# Patient Record
Sex: Female | Born: 1960 | Race: White | Hispanic: No | Marital: Married | Smoking: Former smoker
Health system: Southern US, Community
[De-identification: ages and names within clinical notes are randomized; demographics above are authoritative.]

## PROBLEM LIST (undated history)

## (undated) DIAGNOSIS — J40 Bronchitis, not specified as acute or chronic: Secondary | ICD-10-CM

## (undated) DIAGNOSIS — I10 Essential (primary) hypertension: Secondary | ICD-10-CM

## (undated) DIAGNOSIS — R600 Localized edema: Secondary | ICD-10-CM

## (undated) DIAGNOSIS — I509 Heart failure, unspecified: Secondary | ICD-10-CM

## (undated) DIAGNOSIS — IMO0001 Reserved for inherently not codable concepts without codable children: Secondary | ICD-10-CM

## (undated) DIAGNOSIS — G4730 Sleep apnea, unspecified: Secondary | ICD-10-CM

## (undated) DIAGNOSIS — F419 Anxiety disorder, unspecified: Secondary | ICD-10-CM

## (undated) DIAGNOSIS — R32 Unspecified urinary incontinence: Secondary | ICD-10-CM

## (undated) DIAGNOSIS — K219 Gastro-esophageal reflux disease without esophagitis: Secondary | ICD-10-CM

## (undated) DIAGNOSIS — E039 Hypothyroidism, unspecified: Secondary | ICD-10-CM

## (undated) DIAGNOSIS — F329 Major depressive disorder, single episode, unspecified: Secondary | ICD-10-CM

## (undated) DIAGNOSIS — J189 Pneumonia, unspecified organism: Secondary | ICD-10-CM

## (undated) DIAGNOSIS — Z87440 Personal history of urinary (tract) infections: Secondary | ICD-10-CM

## (undated) DIAGNOSIS — J449 Chronic obstructive pulmonary disease, unspecified: Secondary | ICD-10-CM

## (undated) DIAGNOSIS — F32A Depression, unspecified: Secondary | ICD-10-CM

## (undated) DIAGNOSIS — R200 Anesthesia of skin: Secondary | ICD-10-CM

## (undated) DIAGNOSIS — I2510 Atherosclerotic heart disease of native coronary artery without angina pectoris: Secondary | ICD-10-CM

## (undated) DIAGNOSIS — R1310 Dysphagia, unspecified: Secondary | ICD-10-CM

## (undated) HISTORY — PX: OTHER SURGICAL HISTORY: SHX169

## (undated) HISTORY — PX: TRACHEOSTOMY CLOSURE: SHX458

## (undated) HISTORY — PX: ACHILLES TENDON SURGERY: SHX542

## (undated) HISTORY — PX: TONSILECTOMY, ADENOIDECTOMY, BILATERAL MYRINGOTOMY AND TUBES: SHX2538

## (undated) HISTORY — DX: Chronic obstructive pulmonary disease, unspecified: J44.9

## (undated) HISTORY — DX: Hypothyroidism, unspecified: E03.9

## (undated) HISTORY — PX: BACK SURGERY: SHX140

## (undated) HISTORY — PX: TONSILLECTOMY: SUR1361

## (undated) HISTORY — DX: Sleep apnea, unspecified: G47.30

## (undated) HISTORY — PX: TRACHEOSTOMY: SUR1362

## (undated) HISTORY — PX: GASTROSTOMY W/ FEEDING TUBE: SUR642

## (undated) HISTORY — DX: Heart failure, unspecified: I50.9

---

## 2002-11-23 ENCOUNTER — Ambulatory Visit (HOSPITAL_COMMUNITY): Admission: RE | Admit: 2002-11-23 | Discharge: 2002-11-23 | Payer: Self-pay | Admitting: Family Medicine

## 2003-01-21 ENCOUNTER — Ambulatory Visit (HOSPITAL_COMMUNITY): Admission: RE | Admit: 2003-01-21 | Discharge: 2003-01-21 | Payer: Self-pay | Admitting: Internal Medicine

## 2003-01-31 ENCOUNTER — Encounter: Admission: RE | Admit: 2003-01-31 | Discharge: 2003-01-31 | Payer: Self-pay | Admitting: Orthopedic Surgery

## 2003-02-14 ENCOUNTER — Encounter: Admission: RE | Admit: 2003-02-14 | Discharge: 2003-02-14 | Payer: Self-pay | Admitting: Orthopedic Surgery

## 2003-02-28 ENCOUNTER — Encounter: Admission: RE | Admit: 2003-02-28 | Discharge: 2003-02-28 | Payer: Self-pay | Admitting: Orthopedic Surgery

## 2003-03-17 ENCOUNTER — Ambulatory Visit (HOSPITAL_COMMUNITY): Admission: RE | Admit: 2003-03-17 | Discharge: 2003-03-20 | Payer: Self-pay | Admitting: Neurological Surgery

## 2003-03-18 ENCOUNTER — Encounter (INDEPENDENT_AMBULATORY_CARE_PROVIDER_SITE_OTHER): Payer: Self-pay | Admitting: Specialist

## 2003-09-29 ENCOUNTER — Inpatient Hospital Stay (HOSPITAL_COMMUNITY): Admission: RE | Admit: 2003-09-29 | Discharge: 2003-09-30 | Payer: Self-pay | Admitting: Neurological Surgery

## 2003-11-22 ENCOUNTER — Ambulatory Visit (HOSPITAL_COMMUNITY): Admission: RE | Admit: 2003-11-22 | Discharge: 2003-11-22 | Payer: Self-pay | Admitting: Orthopedic Surgery

## 2004-04-04 ENCOUNTER — Ambulatory Visit (HOSPITAL_COMMUNITY): Admission: RE | Admit: 2004-04-04 | Discharge: 2004-04-04 | Payer: Self-pay | Admitting: Family Medicine

## 2004-06-14 ENCOUNTER — Ambulatory Visit (HOSPITAL_COMMUNITY): Admission: RE | Admit: 2004-06-14 | Discharge: 2004-06-14 | Payer: Self-pay | Admitting: Neurological Surgery

## 2004-07-31 ENCOUNTER — Inpatient Hospital Stay (HOSPITAL_COMMUNITY): Admission: RE | Admit: 2004-07-31 | Discharge: 2004-08-04 | Payer: Self-pay | Admitting: Neurological Surgery

## 2006-02-13 ENCOUNTER — Ambulatory Visit (HOSPITAL_COMMUNITY): Admission: RE | Admit: 2006-02-13 | Discharge: 2006-02-13 | Payer: Self-pay | Admitting: Family Medicine

## 2006-07-01 ENCOUNTER — Ambulatory Visit (HOSPITAL_COMMUNITY): Admission: RE | Admit: 2006-07-01 | Discharge: 2006-07-01 | Payer: Self-pay | Admitting: Family Medicine

## 2006-10-07 ENCOUNTER — Inpatient Hospital Stay (HOSPITAL_COMMUNITY): Admission: RE | Admit: 2006-10-07 | Discharge: 2006-10-08 | Payer: Self-pay | Admitting: Neurological Surgery

## 2008-02-29 ENCOUNTER — Ambulatory Visit (HOSPITAL_COMMUNITY): Admission: RE | Admit: 2008-02-29 | Discharge: 2008-02-29 | Payer: Self-pay | Admitting: Family Medicine

## 2008-03-23 ENCOUNTER — Ambulatory Visit: Payer: Self-pay | Admitting: Orthopedic Surgery

## 2008-03-23 DIAGNOSIS — M7660 Achilles tendinitis, unspecified leg: Secondary | ICD-10-CM | POA: Insufficient documentation

## 2008-03-30 ENCOUNTER — Ambulatory Visit: Payer: Self-pay | Admitting: Orthopedic Surgery

## 2008-03-30 ENCOUNTER — Encounter: Admission: RE | Admit: 2008-03-30 | Discharge: 2008-05-10 | Payer: Self-pay | Admitting: Orthopedic Surgery

## 2008-04-01 ENCOUNTER — Telehealth: Payer: Self-pay | Admitting: Orthopedic Surgery

## 2008-04-01 ENCOUNTER — Encounter: Payer: Self-pay | Admitting: Orthopedic Surgery

## 2008-04-19 ENCOUNTER — Encounter: Payer: Self-pay | Admitting: Orthopedic Surgery

## 2008-05-18 ENCOUNTER — Ambulatory Visit: Payer: Self-pay | Admitting: Orthopedic Surgery

## 2008-05-20 ENCOUNTER — Telehealth: Payer: Self-pay | Admitting: Orthopedic Surgery

## 2008-05-24 ENCOUNTER — Ambulatory Visit (HOSPITAL_COMMUNITY): Admission: RE | Admit: 2008-05-24 | Discharge: 2008-05-24 | Payer: Self-pay | Admitting: Orthopedic Surgery

## 2008-05-24 ENCOUNTER — Ambulatory Visit: Payer: Self-pay | Admitting: Orthopedic Surgery

## 2008-05-26 ENCOUNTER — Ambulatory Visit: Payer: Self-pay | Admitting: Orthopedic Surgery

## 2008-05-26 ENCOUNTER — Encounter: Payer: Self-pay | Admitting: Orthopedic Surgery

## 2008-06-02 ENCOUNTER — Ambulatory Visit: Payer: Self-pay | Admitting: Orthopedic Surgery

## 2008-06-06 ENCOUNTER — Ambulatory Visit: Payer: Self-pay | Admitting: Orthopedic Surgery

## 2008-06-06 ENCOUNTER — Encounter (INDEPENDENT_AMBULATORY_CARE_PROVIDER_SITE_OTHER): Payer: Self-pay | Admitting: *Deleted

## 2008-06-07 ENCOUNTER — Encounter: Payer: Self-pay | Admitting: Orthopedic Surgery

## 2008-06-07 ENCOUNTER — Encounter (HOSPITAL_COMMUNITY): Admission: RE | Admit: 2008-06-07 | Discharge: 2008-07-07 | Payer: Self-pay | Admitting: Orthopedic Surgery

## 2008-06-13 ENCOUNTER — Encounter: Payer: Self-pay | Admitting: Orthopedic Surgery

## 2008-06-15 ENCOUNTER — Encounter: Payer: Self-pay | Admitting: Orthopedic Surgery

## 2008-06-17 ENCOUNTER — Telehealth: Payer: Self-pay | Admitting: Orthopedic Surgery

## 2008-06-28 ENCOUNTER — Telehealth: Payer: Self-pay | Admitting: Orthopedic Surgery

## 2008-07-04 ENCOUNTER — Ambulatory Visit: Payer: Self-pay | Admitting: Orthopedic Surgery

## 2008-07-04 ENCOUNTER — Encounter (INDEPENDENT_AMBULATORY_CARE_PROVIDER_SITE_OTHER): Payer: Self-pay | Admitting: *Deleted

## 2008-07-11 ENCOUNTER — Encounter (HOSPITAL_COMMUNITY): Admission: RE | Admit: 2008-07-11 | Discharge: 2008-08-10 | Payer: Self-pay | Admitting: Orthopedic Surgery

## 2008-07-15 ENCOUNTER — Encounter: Payer: Self-pay | Admitting: Orthopedic Surgery

## 2008-07-27 ENCOUNTER — Encounter (INDEPENDENT_AMBULATORY_CARE_PROVIDER_SITE_OTHER): Payer: Self-pay | Admitting: *Deleted

## 2008-07-27 ENCOUNTER — Ambulatory Visit: Payer: Self-pay | Admitting: Orthopedic Surgery

## 2008-07-28 ENCOUNTER — Encounter: Payer: Self-pay | Admitting: Orthopedic Surgery

## 2008-08-03 ENCOUNTER — Telehealth: Payer: Self-pay | Admitting: Orthopedic Surgery

## 2008-08-08 ENCOUNTER — Telehealth: Payer: Self-pay | Admitting: Orthopedic Surgery

## 2008-08-17 ENCOUNTER — Ambulatory Visit: Payer: Self-pay | Admitting: Orthopedic Surgery

## 2008-08-17 ENCOUNTER — Encounter (INDEPENDENT_AMBULATORY_CARE_PROVIDER_SITE_OTHER): Payer: Self-pay | Admitting: *Deleted

## 2008-09-20 ENCOUNTER — Ambulatory Visit: Payer: Self-pay | Admitting: Orthopedic Surgery

## 2008-09-23 ENCOUNTER — Encounter: Payer: Self-pay | Admitting: Orthopedic Surgery

## 2010-05-02 LAB — BASIC METABOLIC PANEL
CO2: 25 mEq/L (ref 19–32)
Calcium: 9.7 mg/dL (ref 8.4–10.5)
Creatinine, Ser: 0.64 mg/dL (ref 0.4–1.2)
Glucose, Bld: 94 mg/dL (ref 70–99)

## 2010-06-05 NOTE — Op Note (Signed)
Meghan Hoover, Meghan Hoover                ACCOUNT NO.:  [id number]   MEDICAL RECORD NO.:  [id number]          PATIENT TYPE:  AMB   LOCATION:  DAY                           FACILITY:  [facility]   PHYSICIAN:  Vickki Hearing, M.D.DATE OF BIRTH:  Oct 30, 1960   DATE OF PROCEDURE:  05/24/2008  DATE OF DISCHARGE:                               OPERATIVE REPORT   HISTORY:  This is a 50 year old female referred to me by Dr. Nobie Putnam  for palm bump and pain in the posterior aspect of her left ankle since  August 2009.  She had a injury to her right ankle, but turned and placed  all of her weight on her left foot and felt immediate pain in the back  of the ankle and then had difficulty walking.  She was treated  conservatively, eventually took some Tylenol, Aleve, ibuprofen, worn  ankle brace, tried ice, and pain patches did not improve.  I saw her in  physical therapy.  She wore open back shoes, still did not improve.  She  was having difficulty ambulating and could not really climb the stairs  well.  We offered her surgery versus continued nonoperative treatment  and she opted for surgery.  Informed consent was done in the office.  The patient understand the risk of infection, wound problems, and tendon  rupture as well as ankle stiffness.   She agreed to perform physical therapy program as prescribed.   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   PROCEDURE:  Debridement of Achilles tendon resection of Haglund  deformity.   SURGEON:  Vickki Hearing, MD   ASSISTANTS:  Irena Reichmann.   ANESTHESIA:  General.   FINDINGS:  Tendonitis mild and Achilles tendon, large pump bump Haglund  deformity.   SPECIMENS:  None.  Case was clean.   No complications were noted.  Counts were reported as correct.   PROCEDURE DONE AS FOLLOWS:  Site  marking, the patient identification,  and chart update performed in the preop area.  The patient brought to  surgery given preoperative antibiotics with gram of Ancef.  Tolerated  that without any problems.  She was given general anesthesia and placed  in lateral decubitus position with an axillary roll left side up.   After sterile prep and drape with DuraPrep, time-out procedure was  completed.   Straight incision was made anterior to the Achilles tendon, but  posterior to lateral malleolus and taken down through the subcutaneous  tissue with blunt dissection.  Subperiosteal dissection was performed in  the calcaneus and the Achilles tendon was elevated from the superior  margin of the calcaneus.  Debridement was performed less than 25% of the  tendon required debridement of bursa was removed.  Blunt retractors were  placed on the medial side of the calcaneus and oscillating saw used to  removed the Haglund deformity.  This area was contoured with a rasp.  The superior portion was covered with bone  wax and in a suture anchor  size 5.0 corkscrew was placed in the calcaneus and 2 sutures were used  to reapproximate the Achilles to the calcaneus.  The wound was then  irrigated and closed in layered fashion with 0 Monocryl, 2-0 Monocryl,  and 3-0 nylon.  We injected 20 mL of 0.5% Marcaine and then applied a  Cam walker with the foot in neutral position.   POSTOPERATIVE PLAN:  The patient to be nonweightbearing on the left  ankle.  She is discharged with Hydrocodone 10/325 mg one q.4 p.r.n. for  pain.  She is to ice and elevate the foot.  Follow up with me on  Thursday for dressing change.      Vickki Hearing, M.D.  Electronically Signed     SEH/MEDQ  D:  05/24/2008  T:  05/25/2008  Job:  [id number]   cc:   Patrica Duel, M.D.  Fax: [phone]

## 2010-06-05 NOTE — Op Note (Signed)
[name], [name]                ACCOUNT NO.:  [id number]   MEDICAL RECORD NO.:  [id number]          PATIENT TYPE:  INP   LOCATION:  3034                         FACILITY:  [facility]   PHYSICIAN:  Stefani Dama, M.D.  DATE OF BIRTH:  1960-04-29   DATE OF PROCEDURE:  10/07/2006  DATE OF DISCHARGE:                               OPERATIVE REPORT   PREOPERATIVE DIAGNOSIS:  Cervical spondylosis with radiculopathy C5-6  and C6-7.   POSTOPERATIVE DIAGNOSIS:  Cervical spondylosis with radiculopathy C5-6  and C6-7.   PROCEDURE:  Anterior cervical decompression C5-6 and C6-7, arthrodesis  with autograft from left iliac crest bone, anterior plate fixation Z6-X0  with Alphatec plate, bone harvesting from left anterior-superior iliac  crest through separate incision.   SURGEON:  Stefani Dama, M.D.   ASSISTANT:  Dr. Shirlean Kelly.   ANESTHESIA:  General endotracheal.   INDICATIONS:  Meghan Hoover is a 50 year old individual who has had  significant cervical spondylosis with a right cervical radiculopathy.  She was advised regarding surgical decompression arthrodesis.  Because  she is high risk for nonunion because of her smoking history, she opted  for use of iliac crest bone graft which she had used before for lumbar  fusion and this procedure is now being performed.   PROCEDURE:  The patient was brought to the operating room, placed on  table supine position.  After smooth induction of general endotracheal  anesthesia she was placed in 5 pounds of halter traction.  Neck was  prepped with alcohol and DuraPrep and draped in sterile fashion as was  the left anterior-superior iliac crest region.   The patient underwent an anterior approach via transverse incision left  anterior portion of the neck.  The platysma was divided and dissection  of the subcutaneous tissues was obtained until prevertebral space was  reached.  The first identifiable disk space was noted to be that of C4-  C5.  Dissection was then carried inferiorly to expose C5-6 and C6-7.  Large ventral osteophytes were incurred and these were rongeured down  with combination of rongeur and high-speed bur.  Disk space was entered  at the C5-6 first and then a combination of curettes and rongeurs was  used to evacuate a significant quantity of severely desiccated disk  material.  The region of posterior longitudinal ligament was identified  and then the dissection was carried out to the right side where there  was significant osteophytosis from the inferior margin body of C5.  This  was drilled down with high-speed bur.  The uncinate process,  particularly on the right side was hypertrophied and this was similarly  drilled down to 2.3 mm dissecting tool.  In the end the posterior  longitudinal ligament was opened from side-to-side and both lateral  recesses were well decompressed.  While there a localizing radiographs  were being obtained, the procedure was turned to the left anterior  superior iliac spine region.   At the left anterior-superior iliac spine, a longitudinal incision was  created over the anterior iliac spine.  The patient was noted be  morbidly  obese with large panniculus in this region.  Skin was stretched  superiorly so as to allow minimal dissection down to the anterior-  superior iliac spine skin.  The skin in this region was infiltrated with  lidocaine plus epinephrine with 0.25% Marcaine.  The soft tissues and  the fascia in this area was also infiltrated with similar solution for  total of about 15 mL.  The fascia over the anterior-superior iliac spine  was then stripped using a monopolar cautery.  Subperiosteal dissection  was then performed around this area and a portion of the iliac crest was  isolated.  An 8 mm Caspar saw was then used to harvest two continuous  bone grafts from the anterior-superior iliac spine.  The bone grafts  were removed using a small osteotome and when  laid aside, bleeding from  the bone graft donor site was controlled with pledgets of Gelfoam soaked  in thrombin that were pushed into the interstices of bone.  This  controlled the area well.  Surrounding fascia was checked for hemostasis  with cautery and this area was packed off.  Later it was closed with 2-0  Vicryl in the fascia over the anterior-superior iliac spine, 2-0 Vicryl  in subcutaneous tissues and 3-0 Vicryl in the subcuticular tissues  covered by Dermabond.   The main portion of procedure was then continued by taking one of the  smaller grafts and fashioning this to fit into the interspace with the  cortical surface facing ventrally.  The surfaces of the bone graft of  the vertebral endplates were shaped with a 4 mm oval shape bur.  The  graft was tamped and countersunk appropriately.  Attention was then  turned to C6-7 where similar decompression was performed with similar  findings of significant osteophytosis particularly in the uncinate  process on the right side.  With this being decompressed, the second  graft was fashioned the appropriate size and shape and tamped into the  interspace in a similar fashion with the cortical surface facing  ventrally.  Traction was then removed and 37 mm standard size Alphatec  type plate of the Trestle design was used using 14 mm variable angle  screws to secure this at C5, C6 and C7.  Final localizing radiograph  identified the superior screws and the bone graft C5-C6, but C6-C7 could  not be visualized.  Hemostasis was obtained meticulously in the soft  tissues and then the platysma was closed with 3-0 Vicryl interrupted  fashion, 3-0 Vicryl was used in subcuticular tissues and Dermabond was  placed on the skin for both incisions.  The patient tolerated procedure  well, was returned to recovery in stable condition.      Stefani Dama, M.D.  Electronically Signed     HJE/MEDQ  D:  10/07/2006  T:  10/08/2006  Job:   [id number]

## 2010-06-08 NOTE — Discharge Summary (Signed)
Meghan Hoover, Meghan Hoover                ACCOUNT NO.:  [id number]   MEDICAL RECORD NO.:  [id number]          PATIENT TYPE:  INP   LOCATION:  3006                         FACILITY:  [facility]   PHYSICIAN:  Stefani Dama, M.D.  DATE OF BIRTH:  13-Dec-1960   DATE OF ADMISSION:  07/31/2004  DATE OF DISCHARGE:  08/04/2004                                 DISCHARGE SUMMARY   ADMISSION DIAGNOSES:  1.  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  2.  Morbid obesity.  3.  Tobacco abuse.   DISCHARGE DIAGNOSIS/FINAL DIAGNOSES:  1.  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  2.  Morbid obesity.  3.  Tobacco abuse.   CONDITION ON DISCHARGE:  Improving.   HOSPITAL COURSE:  Ms. [name] is a 50 year old individual who has had  significant back and left lower extremity pain. 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 Each  time she got good and prompt relief, however the pain gradually recurred.  Most recent evaluation showed that she had recurrence of the synovial cyst  with significant spondylitic degeneration of the facet joint and the patient  was advised that she should undergo cervical decompression and stabilization  of the joint with posterior interbody fusion and fixation. This was  performed with the use of a spiral plate at J4-7. Postoperatively the  patient has had a moderate amount of back pain and slow to mobilize partly  due to significant size. She has had a considerable amount of muscle spasm.  This has been treated orally with pain medication in the form of Percocet  and also Valium. She has been on a PCA pump and at this time is on oral  medication and is discharged home with a prescription for Percocet 5 mg,  #60, without refills; Valium 5 mg, #40, without refills. She will be seen in  the office in three to four weeks time. Incision is clean and dry. She has  remained afebrile.       HJE/MEDQ  D:  08/04/2004  T:  08/04/2004  Job:  [id number]

## 2010-11-02 LAB — CBC
HCT: 42.2
Hemoglobin: 14.5
MCHC: 34.3
RBC: 4.84
RDW: 15.4 — ABNORMAL HIGH

## 2010-11-02 LAB — COMPREHENSIVE METABOLIC PANEL
Alkaline Phosphatase: 77
BUN: 9
CO2: 28
Calcium: 9.3
GFR calc non Af Amer: >60
Glucose, Bld: 103 — ABNORMAL HIGH
Potassium: 4.7
Total Protein: 6.6

## 2012-01-28 ENCOUNTER — Telehealth: Payer: Self-pay

## 2012-01-28 NOTE — Telephone Encounter (Signed)
   Gastroenterology Pre-Procedure Form  Pt was triaged by Amanda Cockayne, CMA  Request Date: 01/24/2012    Requesting Physician: Dr. Regino Schultze     PATIENT INFORMATION:  Meghan Hoover is a 52 y.o., female (DOB=November 18, 1960).  PROCEDURE: Procedure(s) requested: colonoscopy Procedure Reason: screening for colon cancer  PATIENT REVIEW QUESTIONS: The patient reports the following:   1. Diabetes Melitis: no 2. Joint replacements in the past 12 months: no 3. Major health problems in the past 3 months: no 4. Has an artificial valve or MVP:no 5. Has been advised in past to take antibiotics in advance of a procedure like teeth cleaning: no}    MEDICATIONS & ALLERGIES:    Patient reports the following regarding taking any blood thinners:   Plavix? no Aspirin?yes  ( only prn ) Coumadin?  no  Patient confirms/reports the following medications:  Current Outpatient Prescriptions  Medication Sig Dispense Refill  . aspirin 81 MG tablet Take 81 mg by mouth daily. Pt only takes prn      . buPROPion (WELLBUTRIN SR) 150 MG 12 hr tablet Take 150 mg by mouth 2 (two) times daily.      Marland Kitchen esomeprazole (NEXIUM) 40 MG capsule Take 40 mg by mouth daily before breakfast.      . furosemide (LASIX) 20 MG tablet Take 20 mg by mouth daily. 1/2 TABLET DAILY      . HYDROcodone-homatropine (HYCODAN) 5-1.5 MG/5ML syrup Take by mouth every 6 (six) hours as needed. As directed      . losartan (COZAAR) 100 MG tablet Take 100 mg by mouth daily.      . methocarbamol (ROBAXIN) 500 MG tablet Take 500 mg by mouth 1 day or 1 dose.      Marland Kitchen Olopatadine HCl (PATADAY) 0.2 % SOLN Apply to eye. One gtt both eyes twice daily      . predniSONE (DELTASONE) 5 MG tablet Take 5 mg by mouth daily.        Patient confirms/reports the following allergies:  Allergies  Allergen Reactions  . Augmentin (Amoxicillin-Pot Clavulanate) Other (See Comments)    NOT SURE/ IT'S BEEN AWHILE    Patient is appropriate to schedule for requested  procedure(s): yes  AUTHORIZATION INFORMATION Primary Insurance:   ID #:   Group #:  Pre-Cert / Auth required:  Pre-Cert / Auth #:   Secondary Insurance:  ID #:  Group #:  Pre-Cert / Auth required:  Pre-Cert / Auth #:   No orders of the defined types were placed in this encounter.    SCHEDULE INFORMATION: Procedure has been scheduled as follows:  Date: 02/10/2012     Time: 9:30 AM  Location: [facility] Short Stay  This Gastroenterology Pre-Precedure Form is being routed to the following provider(s) for review: Jonette Eva, MD

## 2012-01-29 ENCOUNTER — Other Ambulatory Visit: Payer: Self-pay

## 2012-01-29 ENCOUNTER — Encounter (HOSPITAL_COMMUNITY): Payer: Self-pay | Admitting: Pharmacy Technician

## 2012-01-29 DIAGNOSIS — Z139 Encounter for screening, unspecified: Secondary | ICD-10-CM

## 2012-01-29 MED ORDER — PEG-KCL-NACL-NASULF-NA ASC-C 100 G PO SOLR: 1.0000 | Freq: Once | ORAL | Status: DC

## 2012-01-29 MED ORDER — PEG-KCL-NACL-NASULF-NA ASC-C 100 G PO SOLR: 1.0000 | Freq: Once | ORAL | Status: AC

## 2012-01-29 NOTE — Telephone Encounter (Signed)
Dr. Darrick Penna, I could not send the order for Movie Prep because of allergy to PCN. Please advise!

## 2012-01-29 NOTE — Telephone Encounter (Signed)
MOVI PREP SPLIT DOSING, REGULAR BREAKFAST. CLEAR LIQUIDS AFTER 9 AM.  

## 2012-01-29 NOTE — Telephone Encounter (Signed)
REVIEWED CONTRAINDICATIONS FOR MOVIPREP. NO CIs FOR PT. RX SENT.

## 2012-01-30 NOTE — Telephone Encounter (Signed)
Instructions mailed to pt.

## 2012-02-03 ENCOUNTER — Telehealth: Payer: Self-pay

## 2012-02-03 NOTE — Telephone Encounter (Signed)
LATE ENTRY:  Pt called at noon on Friday and said she wanted to cancel the colonoscopy that was scheduled for 02/10/2012 with Dr. Darrick Penna due to insurance and she will call and reschedule in March. LMOM for Meghan Hoover this AM.

## 2012-02-10 ENCOUNTER — Ambulatory Visit (HOSPITAL_COMMUNITY)
Admission: RE | Admit: 2012-02-10 | Payer: BC Managed Care – PPO | Source: Ambulatory Visit | Admitting: Gastroenterology

## 2012-02-10 ENCOUNTER — Encounter (HOSPITAL_COMMUNITY): Admission: RE | Payer: Self-pay | Source: Ambulatory Visit

## 2012-02-10 SURGERY — COLONOSCOPY
Anesthesia: Moderate Sedation

## 2012-03-31 ENCOUNTER — Telehealth: Payer: Self-pay | Admitting: *Deleted

## 2012-04-09 NOTE — Telephone Encounter (Signed)
Orders encouter 

## 2012-11-19 ENCOUNTER — Encounter (HOSPITAL_COMMUNITY): Payer: Self-pay | Admitting: Emergency Medicine

## 2012-11-19 ENCOUNTER — Inpatient Hospital Stay (HOSPITAL_COMMUNITY)
Admit: 2012-11-19 | Discharge: 2012-11-19 | Disposition: A | Discharge status: Home / Self Care | Payer: BC Managed Care – PPO | Attending: Physical Therapy | Admitting: Physical Therapy

## 2012-11-19 ENCOUNTER — Inpatient Hospital Stay (HOSPITAL_COMMUNITY)
Admission: EM | Admit: 2012-11-19 | Discharge: 2012-12-09 | DRG: 004 | Disposition: A | Discharge status: Other Acute Inpatient Hospital | Payer: BC Managed Care – PPO | Attending: Pulmonary Disease | Admitting: Pulmonary Disease

## 2012-11-19 ENCOUNTER — Emergency Department (HOSPITAL_COMMUNITY): Payer: BC Managed Care – PPO

## 2012-11-19 DIAGNOSIS — I110 Hypertensive heart disease with heart failure: Secondary | ICD-10-CM | POA: Diagnosis present

## 2012-11-19 DIAGNOSIS — E8779 Other fluid overload: Secondary | ICD-10-CM

## 2012-11-19 DIAGNOSIS — E870 Hyperosmolality and hypernatremia: Secondary | ICD-10-CM | POA: Diagnosis not present

## 2012-11-19 DIAGNOSIS — J9600 Acute respiratory failure, unspecified whether with hypoxia or hypercapnia: Secondary | ICD-10-CM

## 2012-11-19 DIAGNOSIS — I872 Venous insufficiency (chronic) (peripheral): Secondary | ICD-10-CM | POA: Diagnosis present

## 2012-11-19 DIAGNOSIS — Z6841 Body Mass Index (BMI) 40.0 and over, adult: Secondary | ICD-10-CM

## 2012-11-19 DIAGNOSIS — I2609 Other pulmonary embolism with acute cor pulmonale: Secondary | ICD-10-CM

## 2012-11-19 DIAGNOSIS — A419 Sepsis, unspecified organism: Principal | ICD-10-CM

## 2012-11-19 DIAGNOSIS — J189 Pneumonia, unspecified organism: Secondary | ICD-10-CM

## 2012-11-19 DIAGNOSIS — J9620 Acute and chronic respiratory failure, unspecified whether with hypoxia or hypercapnia: Secondary | ICD-10-CM | POA: Diagnosis present

## 2012-11-19 DIAGNOSIS — J449 Chronic obstructive pulmonary disease, unspecified: Secondary | ICD-10-CM | POA: Diagnosis present

## 2012-11-19 DIAGNOSIS — I10 Essential (primary) hypertension: Secondary | ICD-10-CM

## 2012-11-19 DIAGNOSIS — E874 Mixed disorder of acid-base balance: Secondary | ICD-10-CM | POA: Diagnosis present

## 2012-11-19 DIAGNOSIS — F17200 Nicotine dependence, unspecified, uncomplicated: Secondary | ICD-10-CM

## 2012-11-19 DIAGNOSIS — E8770 Fluid overload, unspecified: Secondary | ICD-10-CM

## 2012-11-19 DIAGNOSIS — R609 Edema, unspecified: Secondary | ICD-10-CM

## 2012-11-19 DIAGNOSIS — I509 Heart failure, unspecified: Secondary | ICD-10-CM

## 2012-11-19 DIAGNOSIS — E119 Type 2 diabetes mellitus without complications: Secondary | ICD-10-CM | POA: Diagnosis present

## 2012-11-19 DIAGNOSIS — J9601 Acute respiratory failure with hypoxia: Secondary | ICD-10-CM

## 2012-11-19 DIAGNOSIS — Z79899 Other long term (current) drug therapy: Secondary | ICD-10-CM

## 2012-11-19 DIAGNOSIS — R601 Generalized edema: Secondary | ICD-10-CM

## 2012-11-19 DIAGNOSIS — I498 Other specified cardiac arrhythmias: Secondary | ICD-10-CM | POA: Diagnosis present

## 2012-11-19 DIAGNOSIS — D649 Anemia, unspecified: Secondary | ICD-10-CM

## 2012-11-19 DIAGNOSIS — E662 Morbid (severe) obesity with alveolar hypoventilation: Secondary | ICD-10-CM | POA: Diagnosis present

## 2012-11-19 DIAGNOSIS — L97909 Non-pressure chronic ulcer of unspecified part of unspecified lower leg with unspecified severity: Secondary | ICD-10-CM | POA: Diagnosis present

## 2012-11-19 DIAGNOSIS — I2789 Other specified pulmonary heart diseases: Secondary | ICD-10-CM | POA: Diagnosis present

## 2012-11-19 DIAGNOSIS — G4733 Obstructive sleep apnea (adult) (pediatric): Secondary | ICD-10-CM | POA: Diagnosis present

## 2012-11-19 DIAGNOSIS — K219 Gastro-esophageal reflux disease without esophagitis: Secondary | ICD-10-CM | POA: Diagnosis present

## 2012-11-19 DIAGNOSIS — J4489 Other specified chronic obstructive pulmonary disease: Secondary | ICD-10-CM | POA: Diagnosis present

## 2012-11-19 DIAGNOSIS — E876 Hypokalemia: Secondary | ICD-10-CM

## 2012-11-19 DIAGNOSIS — I5031 Acute diastolic (congestive) heart failure: Secondary | ICD-10-CM

## 2012-11-19 DIAGNOSIS — I272 Pulmonary hypertension, unspecified: Secondary | ICD-10-CM

## 2012-11-19 DIAGNOSIS — R600 Localized edema: Secondary | ICD-10-CM

## 2012-11-19 DIAGNOSIS — Z930 Tracheostomy status: Secondary | ICD-10-CM

## 2012-11-19 HISTORY — DX: Essential (primary) hypertension: I10

## 2012-11-19 HISTORY — DX: Gastro-esophageal reflux disease without esophagitis: K21.9

## 2012-11-19 LAB — CBC WITH DIFFERENTIAL/PLATELET
Basophils Absolute: 0.1 10*3/uL (ref 0.0–0.1)
Eosinophils Absolute: 0.5 10*3/uL (ref 0.0–0.7)
HCT: 40.6 % (ref 36.0–46.0)
Lymphocytes Relative: 29 % (ref 12–46)
MCH: 21.6 pg — ABNORMAL LOW (ref 26.0–34.0)
MCHC: 27.3 g/dL — ABNORMAL LOW (ref 30.0–36.0)
Monocytes Absolute: 0.9 10*3/uL (ref 0.1–1.0)
Neutro Abs: 5.6 10*3/uL (ref 1.7–7.7)
Neutrophils Relative %: 57 % (ref 43–77)
RDW: 20 % — ABNORMAL HIGH (ref 11.5–15.5)
WBC: 9.9 10*3/uL (ref 4.0–10.5)

## 2012-11-19 LAB — BASIC METABOLIC PANEL
Calcium: 9.3 mg/dL (ref 8.4–10.5)
GFR calc Af Amer: 71 mL/min — ABNORMAL LOW (ref >90)
GFR calc non Af Amer: 61 mL/min — ABNORMAL LOW (ref >90)
Glucose, Bld: 112 mg/dL — ABNORMAL HIGH (ref 70–99)
Potassium: 3.1 mEq/L — ABNORMAL LOW (ref 3.5–5.1)
Sodium: 141 mEq/L (ref 135–145)

## 2012-11-19 LAB — TROPONIN I: Troponin I: <0.3 ng/mL (ref <0.30)

## 2012-11-19 LAB — PROTIME-INR
INR: 1.14 (ref 0.00–1.49)
Prothrombin Time: 14.4 seconds (ref 11.6–15.2)

## 2012-11-19 LAB — APTT: aPTT: 27 seconds (ref 24–37)

## 2012-11-19 MED ORDER — ENOXAPARIN SODIUM 40 MG/0.4ML ~~LOC~~ SOLN
40.0000 mg | SUBCUTANEOUS | Status: DC
  Administered 2012-11-19: 40 mg via SUBCUTANEOUS
  Filled 2012-11-19: qty 0.4

## 2012-11-19 MED ORDER — NICOTINE 21 MG/24HR TD PT24
21.0000 mg | MEDICATED_PATCH | Freq: Every day | TRANSDERMAL | Status: DC
  Administered 2012-11-19 – 2012-11-30 (×12): 21 mg via TRANSDERMAL
  Filled 2012-11-19 (×13): qty 1

## 2012-11-19 MED ORDER — FUROSEMIDE 10 MG/ML IJ SOLN
40.0000 mg | Freq: Two times a day (BID) | INTRAMUSCULAR | Status: DC
  Administered 2012-11-20: 40 mg via INTRAVENOUS
  Filled 2012-11-19 (×2): qty 4

## 2012-11-19 MED ORDER — SODIUM CHLORIDE 0.9 % IV SOLN
250.0000 mL | INTRAVENOUS | Status: DC | PRN
  Administered 2012-11-29: 250 mL via INTRAVENOUS

## 2012-11-19 MED ORDER — LOSARTAN POTASSIUM 50 MG PO TABS
100.0000 mg | ORAL_TABLET | Freq: Every day | ORAL | Status: DC
  Administered 2012-11-20 – 2012-11-23 (×4): 100 mg via ORAL
  Filled 2012-11-19 (×4): qty 2

## 2012-11-19 MED ORDER — SODIUM CHLORIDE 0.9 % IJ SOLN
3.0000 mL | Freq: Two times a day (BID) | INTRAMUSCULAR | Status: DC
  Administered 2012-11-20 – 2012-12-03 (×19): 3 mL via INTRAVENOUS

## 2012-11-19 MED ORDER — PANTOPRAZOLE SODIUM 40 MG PO TBEC
80.0000 mg | DELAYED_RELEASE_TABLET | Freq: Every day | ORAL | Status: DC
  Administered 2012-11-20: 80 mg via ORAL
  Filled 2012-11-19: qty 2

## 2012-11-19 MED ORDER — NICOTINE POLACRILEX 2 MG MT GUM
2.0000 mg | CHEWING_GUM | OROMUCOSAL | Status: DC | PRN
  Filled 2012-11-19: qty 1

## 2012-11-19 MED ORDER — SODIUM CHLORIDE 0.9 % IJ SOLN
3.0000 mL | INTRAMUSCULAR | Status: DC | PRN
  Administered 2012-11-23: 3 mL via INTRAVENOUS

## 2012-11-19 MED ORDER — FUROSEMIDE 10 MG/ML IJ SOLN
40.0000 mg | INTRAMUSCULAR | Status: AC
  Administered 2012-11-19: 40 mg via INTRAVENOUS
  Filled 2012-11-19: qty 4

## 2012-11-19 MED ORDER — POTASSIUM CHLORIDE CRYS ER 20 MEQ PO TBCR
40.0000 meq | EXTENDED_RELEASE_TABLET | Freq: Once | ORAL | Status: AC
  Administered 2012-11-19: 40 meq via ORAL
  Filled 2012-11-19: qty 2

## 2012-11-19 MED ORDER — ONDANSETRON HCL 4 MG/2ML IJ SOLN: 4.0000 mg | Freq: Four times a day (QID) | INTRAMUSCULAR | Status: DC | PRN

## 2012-11-19 MED ORDER — ASPIRIN EC 81 MG PO TBEC
81.0000 mg | DELAYED_RELEASE_TABLET | Freq: Every day | ORAL | Status: DC
  Administered 2012-11-19 – 2012-11-21 (×3): 81 mg via ORAL
  Filled 2012-11-19 (×4): qty 1

## 2012-11-19 MED ORDER — ACETAMINOPHEN 325 MG PO TABS
650.0000 mg | ORAL_TABLET | ORAL | Status: DC | PRN
  Administered 2012-11-20 – 2012-11-30 (×2): 650 mg via ORAL
  Filled 2012-11-19 (×2): qty 2

## 2012-11-19 NOTE — Progress Notes (Signed)
Patient's blood CO2 40. Dr. Irene Limbo notified.

## 2012-11-19 NOTE — Progress Notes (Signed)
  Patient Details  Name: Meghan Hoover MRN: [id number] Date of Birth: 04-25-1960  Today's Date: 11/19/2012 Time: 1320-1340  Pt comes in for wound care to BLE with hx of recent cellulitis to BLE.  At this time she has complaints of 40-50 lb weight gait over the past month, difficulty breathing with any transitional movement, BLE weakness.  At this time pt demonstrates pitting edema to BLE and to stomach region. .  Ambulates 60 85 feet in 2 minutes with 4/4 dyspnea.    Mother is present with patient today.  Encouraged to go to the ED for a full evaluation due to her extreme difficulty in breathing and significant weight change.  Pt agreeable to be transported via W/C to ED.   Meghan Hoover 11/19/2012, 1:51 PM

## 2012-11-19 NOTE — ED Provider Notes (Addendum)
CSN: [id number]     Arrival date & time 11/19/12  1348 History   First MD Initiated Contact with Patient 11/19/12 1408     Chief Complaint  Patient presents with  . Shortness of Breath  . Leg Swelling   (Consider location/radiation/quality/duration/timing/severity/associated sxs/prior Treatment) HPI Comments: The pt is a 52 y/o female who is self admittedly obese who presents with a complaint of swelling and SOB - she is seen primarily at Dr. Edison Simon office - she has been at the wound clinic for a LLE wound that has not been healing well.  She reports SOB that is severe persistent and is gradually worsening - she is unable to lay flat and has severe swelling of her legs bilaterally.  She deneis fever but has had some cough.  The redness on her legs is getting slightly worse and her family member points out that her abdomen is as "hard as a brick"  Patient is a 52 y.o. female presenting with shortness of breath. The history is provided by the patient and a relative.  Shortness of Breath   Past Medical History  Diagnosis Date  . Hypertension   . GERD (gastroesophageal reflux disease)    Past Surgical History  Procedure Laterality Date  . Back surgery    . Achilles tendon surgery     No family history on file. History  Substance Use Topics  . Smoking status: Current Every Day Smoker    Types: Cigarettes  . Smokeless tobacco: Not on file  . Alcohol Use: No   OB History   Grav Para Term Preterm Abortions TAB SAB Ect Mult Living                 Review of Systems  Respiratory: Positive for shortness of breath.   All other systems reviewed and are negative.    Allergies  Augmentin  Home Medications   Current Outpatient Rx  Name  Route  Sig  Dispense  Refill  . betamethasone dipropionate (DIPROLENE) 0.05 % cream   Topical   Apply 1 application topically 2 (two) times daily as needed. rash         . esomeprazole (NEXIUM) 40 MG capsule   Oral   Take 40 mg by mouth  daily before breakfast.         . furosemide (LASIX) 20 MG tablet   Oral   Take 10 mg by mouth daily.          Marland Kitchen losartan (COZAAR) 100 MG tablet   Oral   Take 100 mg by mouth daily.         . methocarbamol (ROBAXIN) 500 MG tablet   Oral   Take 500 mg by mouth daily.           BP 142/80  Pulse 88  Temp(Src) 98.2 F (36.8 C) (Oral)  Resp 21  SpO2 92% Physical Exam  Nursing note and vitals reviewed. Constitutional: She appears well-developed and well-nourished. She appears distressed.  HENT:  Head: Normocephalic and atraumatic.  Mouth/Throat: Oropharynx is clear and moist. No oropharyngeal exudate.  Eyes: Conjunctivae and EOM are normal. Pupils are equal, round, and reactive to light. Right eye exhibits no discharge. Left eye exhibits no discharge. No scleral icterus.  Neck: Normal range of motion. Neck supple. No JVD present. No thyromegaly present.  Cardiovascular: Normal rate, regular rhythm, normal heart sounds and intact distal pulses.  Exam reveals no gallop and no friction rub.   No murmur heard. Pulmonary/Chest: No  respiratory distress. She has wheezes. She has rales.  Slight tachypnea with inc WOB, no accessory muscle use  Abdominal: Soft. Bowel sounds are normal. She exhibits distension. She exhibits no mass. There is tenderness.  Anasarca to the umbilicus  Musculoskeletal: She exhibits edema. She exhibits no tenderness.  Severe bilateral LE edema  Lymphadenopathy:    She has no cervical adenopathy.  Neurological: She is alert. Coordination normal.  Skin: Skin is warm and dry.  Wound to the LLE - no foul smell or drainage.  Psychiatric: She has a normal mood and affect. Her behavior is normal.    ED Course  Procedures (including critical care time) Labs Review Labs Reviewed  BASIC METABOLIC PANEL - Abnormal; Notable for the following:    Potassium 3.1 (*)    Chloride 93 (*)    CO2 40 (*)    Glucose, Bld 112 (*)    GFR calc non Af Amer 61 (*)     GFR calc Af Amer 71 (*)    All other components within normal limits  CBC WITH DIFFERENTIAL - Abnormal; Notable for the following:    RBC 5.15 (*)    Hemoglobin 11.1 (*)    MCH 21.6 (*)    MCHC 27.3 (*)    RDW 20.0 (*)    All other components within normal limits  PRO B NATRIURETIC PEPTIDE - Abnormal; Notable for the following:    Pro B Natriuretic peptide (BNP) 3418.0 (*)    All other components within normal limits  APTT  PROTIME-INR  TROPONIN I   Imaging Review Dg Chest Port 1 View  11/19/2012   CLINICAL DATA:  Chest pain and hypertension.  EXAM: PORTABLE CHEST - 1 VIEW  COMPARISON:  02/29/2008  FINDINGS: Mild to moderately degraded exam due to AP portable technique and patient body habitus. Lower cervical spine fixation. Cardiomegaly accentuated by AP portable technique. No right and no definite left pleural effusion. No pneumothorax. Extremely low lung volumes, with resultant pulmonary interstitial prominence. No lobar consolidation. Suboptimal evaluation of the left lung base due to overlying soft tissues.  IMPRESSION: Mild to moderately degraded exam secondary to patient body habitus and AP portable technique.  Given this factor, low lung volumes without definite acute disease.   Electronically Signed   By: Jeronimo Greaves M.D.   On: 11/19/2012 14:38    EKG Interpretation     Ventricular Rate:  85 PR Interval:  146 QRS Duration: 90 QT Interval:  444 QTC Calculation: 528 R Axis:   88 Text Interpretation:  Sinus rhythm Nonspecific ST and T wave abnormality Abnormal ECG When compared with ECG of 20-May-2008 14:31, ST now depressed in Anterior leads Nonspecific T wave abnormality now evident in Inferior leads Nonspecific T wave abnormality now evident in Lateral leads QT has lengthened            MDM   1. CHF (congestive heart failure)   2. Hypokalemia   3.  Severe Hypertension The pt appears acutely ill with severe fluid overload - her 50lb weight gain is undoubtedly  the fluid retention - concern for CHF.  Monitor, CXR, O2, lasix, likely admit.  Pt has elevated BNP - otherwise low K, CO2 slightly elevated.  Pt has non ischemic ECG but is abnormal   In addition, pt VS have been signficantly elevated including > 200 systolic and in the setting of CHF - needs acute treatment with titratable meds - Nitro gtt started in setting of CHF.  CRITICAL CARE Performed  by: Rampersaud,Jametta Moorehead D Total critical care time: 30 Critical care time was exclusive of separately billable procedures and treating other patients. Critical care was necessary to treat or prevent imminent or life-threatening deterioration. Critical care was time spent personally by me on the following activities: development of treatment plan with patient and/or surrogate as well as nursing, discussions with consultants, evaluation of patient's response to treatment, examination of patient, obtaining history from patient or surrogate, ordering and performing treatments and interventions, ordering and review of laboratory studies, ordering and review of radiographic studies, pulse oximetry and re-evaluation of patient's condition.   D/w Dr. Irene Limbo who will admit  Meds given in ED:  Medications  furosemide (LASIX) injection 40 mg (40 mg Intravenous Given 11/19/12 1459)    New Prescriptions   No medications on file      Vida Roller, MD 11/19/12 1559  Vida Roller, MD 11/20/12 [phone]

## 2012-11-19 NOTE — H&P (Signed)
History and Physical  Meghan Hoover ZOX:[id number] DOB: Aug 02, 1960 DOA: 11/19/2012  Referring physician: Dr. Fleet Contras in ED PCP: Kirk Ruths, MD   Chief Complaint: Short of breath  HPI:  52 year old woman presented to the emergency department with increasing shortness of breath. Initial evaluation was notable for hypoxic respiratory failure, massive volume overload from feet up to abdomen suggesting CHF.  History obtained from patient as well as her mother at bedside with patient's permission. Patient has had increasing lower extremity edema for the last 5 months. Over the last 6 weeks this has progressively worsened to the point where she has had spontaneous weeping from her legs. She saw her primary care provider approximately 6 weeks ago, was diagnosed with edema and started on Lasix. Despite this therapy she failed to improve and developed a left lower extremity ulcer. She was referred to the wound clinic. At the wound clinic today she is noted to be quite short of breath and sent to the emergency department.  Patient reports increasing dyspnea on exertion over the last 5 weeks, resolved with rest. This has severely curtailed her activities. This has progressively worsened especially over the last 6 weeks. She reports approximately a 40-50 pound weight gain over the last 6 weeks despite eating a good diet. She has had intermittent chest pain over the last 6 months or perhaps longer. No recent chest pain of note.  In the emergency department she was noted be hypoxic but vitals were otherwise unremarkable. Laboratory studies notable for BNP 3418. Chest x-ray was difficult to interpret but no acute abnormalities are noted. EKG was non acute.   Review of Systems:  Negative for fever, visual changes, sore throat, rash, new muscle aches, dysuria, bleeding, n/v/abdominal pain.  She has had intermittent chest pain weekly for months now. Seems to her current exertion and resolve with  rest.  Past Medical History  Diagnosis Date  . Hypertension   . GERD (gastroesophageal reflux disease)     Past Surgical History  Procedure Laterality Date  . Back surgery    . Achilles tendon surgery      Social History:  reports that she has been smoking Cigarettes.  She has been smoking about 2.00 packs per day. She does not have any smokeless tobacco history on file. She reports that she does not drink alcohol or use illicit drugs.  Allergies  Allergen Reactions  . Augmentin [Amoxicillin-Pot Clavulanate] Other (See Comments)    NOT SURE/ IT'S BEEN AWHILE    Family History  Problem Relation Age of Onset  . Diabetes Sister      Prior to Admission medications   Medication Sig Start Date End Date Taking? Authorizing Provider  betamethasone dipropionate (DIPROLENE) 0.05 % cream Apply 1 application topically 2 (two) times daily as needed. rash 11/06/12  Yes Historical Provider, MD  esomeprazole (NEXIUM) 40 MG capsule Take 40 mg by mouth daily before breakfast.   Yes Historical Provider, MD  furosemide (LASIX) 20 MG tablet Take 10 mg by mouth daily.    Yes Historical Provider, MD  losartan (COZAAR) 100 MG tablet Take 100 mg by mouth daily.   Yes Historical Provider, MD  methocarbamol (ROBAXIN) 500 MG tablet Take 500 mg by mouth daily.    Yes Historical Provider, MD   Physical Exam: Filed Vitals:   11/19/12 1403 11/19/12 1510  BP:  142/80  Pulse: 79 88  Temp: 98.2 F (36.8 C)   TempSrc: Oral   Resp:  21  SpO2: 99% 92%  General: Examined in the emergency department. Appears calm and comfortable Eyes: PERRL, normal lids, irises ENT: grossly normal hearing, lips & tongue Neck: no LAD, masses or thyromegaly Cardiovascular: RRR, no m/r/g. Massive bilateral lower extremity edema up to the abdomen. Respiratory: CTA bilaterally, no w/r/r. Normal respiratory effort. Abdomen: soft, ntnd, obese. Pitting edema lower third of the abdomen. Marked induration. Skin: Chronic  venous stasis changes bilateral lower extremities. Large oval ulcer anterior lower leg left. Musculoskeletal: grossly normal tone BUE/BLE Psychiatric: grossly normal mood and affect, speech fluent and appropriate Neurologic: grossly non-focal.  Wt Readings from Last 3 Encounters:  03/23/08 136.079 kg (300 lb)    Labs on Admission:  Basic Metabolic Panel:  Recent Labs Lab 11/19/12 1428  NA 141  K 3.1*  CL 93*  CO2 40*  GLUCOSE 112*  BUN 14  CREATININE 1.03  CALCIUM 9.3   CBC:  Recent Labs Lab 11/19/12 1428  WBC 9.9  NEUTROABS 5.6  HGB 11.1*  HCT 40.6  MCV 78.8  PLT 313    Cardiac Enzymes:  Recent Labs Lab 11/19/12 1428  TROPONINI <0.30    Recent Labs  11/19/12 1428  PROBNP 3418.0*     Radiological Exams on Admission: Dg Chest Port 1 View  11/19/2012   CLINICAL DATA:  Chest pain and hypertension.  EXAM: PORTABLE CHEST - 1 VIEW  COMPARISON:  02/29/2008  FINDINGS: Mild to moderately degraded exam due to AP portable technique and patient body habitus. Lower cervical spine fixation. Cardiomegaly accentuated by AP portable technique. No right and no definite left pleural effusion. No pneumothorax. Extremely low lung volumes, with resultant pulmonary interstitial prominence. No lobar consolidation. Suboptimal evaluation of the left lung base due to overlying soft tissues.  IMPRESSION: Mild to moderately degraded exam secondary to patient body habitus and AP portable technique.  Given this factor, low lung volumes without definite acute disease.   Electronically Signed   By: Jeronimo Greaves M.D.   On: 11/19/2012 14:38    EKG: Independently reviewed. Normal sinus rhythm. No acute changes. Nonspecific ST changes.   Principal Problem:   Acute respiratory failure with hypoxia Active Problems:   Volume overload   Bilateral lower extremity edema   HTN (hypertension)   Normocytic anemia   Tobacco dependence   Assessment/Plan 1. Acute respiratory failure with  hypoxia: Most likely secondary to volume overload. No evidence to suggest COPD exacerbation. Supplemental oxygen. Diuresis. 2. Massive volume overload: Suspicious for heart failure. Obtain 2-D echocardiogram. Diuresis. No recent chest pain to suggest ACS. EKG non acute. 3. Hypertension: Stable. 4. Hypokalemia: Replete. 5. Normocytic anemia: Monitor. 6. Morbid obesity: Nutrition consult 7. Tobacco dependence: Smokes 2 packs per day. I advised her to quit, discussed connection to emphysema and lung cancer. She is not interested in quitting. Will offer nicotine patch and gum. Offer outpatient resources on discharge.  Code Status: Full code  DVT prophylaxis: Lovenox Family Communication: Discussed with mother at bedside Disposition Plan/Anticipated LOS: Admit, 3-4 days  Time spent: 55 minutes  Brendia Sacks, MD  [facility] Pager [phone] 11/19/2012, 4:30 PM

## 2012-11-19 NOTE — ED Notes (Signed)
Here today for wound therapy appt and was sent to ED for worsening sob.  Denies pain.  C/o increased sob and bil leg swelling.

## 2012-11-20 ENCOUNTER — Encounter (HOSPITAL_COMMUNITY): Payer: Self-pay

## 2012-11-20 DIAGNOSIS — I509 Heart failure, unspecified: Secondary | ICD-10-CM

## 2012-11-20 DIAGNOSIS — I5031 Acute diastolic (congestive) heart failure: Secondary | ICD-10-CM

## 2012-11-20 DIAGNOSIS — I369 Nonrheumatic tricuspid valve disorder, unspecified: Secondary | ICD-10-CM

## 2012-11-20 LAB — BLOOD GAS, ARTERIAL
Acid-Base Excess: 16.9 mmol/L — ABNORMAL HIGH (ref 0.0–2.0)
Bicarbonate: 44.6 mEq/L — ABNORMAL HIGH (ref 20.0–24.0)
Drawn by: 22223
FIO2: 50 %
pCO2 arterial: 106 mmHg (ref 35.0–45.0)
pO2, Arterial: 62.9 mmHg — ABNORMAL LOW (ref 80.0–100.0)

## 2012-11-20 LAB — BASIC METABOLIC PANEL
BUN: 11 mg/dL (ref 6–23)
CO2: 42 mEq/L (ref 19–32)
Chloride: 95 mEq/L — ABNORMAL LOW (ref 96–112)
Creatinine, Ser: 0.8 mg/dL (ref 0.50–1.10)
GFR calc Af Amer: >90 mL/min (ref >90)
Glucose, Bld: 132 mg/dL — ABNORMAL HIGH (ref 70–99)
Potassium: 3.4 mEq/L — ABNORMAL LOW (ref 3.5–5.1)
Sodium: 143 mEq/L (ref 135–145)

## 2012-11-20 LAB — CBC
HCT: 40.7 % (ref 36.0–46.0)
Hemoglobin: 11 g/dL — ABNORMAL LOW (ref 12.0–15.0)
MCV: 80.6 fL (ref 78.0–100.0)
RDW: 20 % — ABNORMAL HIGH (ref 11.5–15.5)
WBC: 11.1 10*3/uL — ABNORMAL HIGH (ref 4.0–10.5)

## 2012-11-20 MED ORDER — FUROSEMIDE 10 MG/ML IJ SOLN
40.0000 mg | Freq: Three times a day (TID) | INTRAMUSCULAR | Status: DC
  Administered 2012-11-20 – 2012-11-23 (×10): 40 mg via INTRAVENOUS
  Filled 2012-11-20 (×12): qty 4

## 2012-11-20 MED ORDER — ENOXAPARIN SODIUM 100 MG/ML ~~LOC~~ SOLN
100.0000 mg | SUBCUTANEOUS | Status: DC
  Administered 2012-11-20 – 2012-12-01 (×12): 100 mg via SUBCUTANEOUS
  Filled 2012-11-20 (×13): qty 1

## 2012-11-20 MED ORDER — POTASSIUM CHLORIDE CRYS ER 20 MEQ PO TBCR
40.0000 meq | EXTENDED_RELEASE_TABLET | Freq: Every day | ORAL | Status: DC
  Administered 2012-11-20: 40 meq via ORAL
  Filled 2012-11-20: qty 2

## 2012-11-20 MED ORDER — ADULT MULTIVITAMIN W/MINERALS CH
1.0000 | ORAL_TABLET | Freq: Every day | ORAL | Status: DC
  Administered 2012-11-20 – 2012-11-24 (×5): 1 via ORAL
  Filled 2012-11-20 (×5): qty 1

## 2012-11-20 NOTE — Progress Notes (Signed)
PT SATS very decreased 81-83 , finally went up to 86, PT not urinating as Son states since 42. PT PCO2 most likely high. PT still smokes, and is obese. PT may need BIPAP if not turning.

## 2012-11-20 NOTE — Care Management Note (Addendum)
    Page 1 of 2   12/07/2012     1:48:53 PM   CARE MANAGEMENT NOTE 12/07/2012  Patient:  Meghan Hoover, Meghan Hoover   Account Number:  [id number]  Date Initiated:  11/20/2012  Documentation initiated by:  Sharrie Rothman  Subjective/Objective Assessment:   Pt admitted from home with CHF and respiratory failure. Pt lives with her husband and daughter is at the pts bedside offering information since pt is very sleepy. According to daughter pt has been independent with ADL's. Pt has no DME in     Action/Plan:   the home at present. Will need to follow for possible HH needs and any DME needs for discharge.   Anticipated DC Date:  11/24/2012   Anticipated DC Plan:  HOME W HOME HEALTH SERVICES      DC Planning Services  CM consult      Choice offered to / List presented to:             Status of service:  In process, will continue to follow Medicare Important Message given?   (If response is "NO", the following Medicare IM given date fields will be blank) Date Medicare IM given:   Date Additional Medicare IM given:    Discharge Disposition:  HOME W HOME HEALTH SERVICES  Per UR Regulation:    If discussed at Long Length of Stay Meetings, dates discussed:   11/24/2012  11/26/2012  12/01/2012    Comments:  ContactGeorgenia, Salim Spouse [phone]                 Gibson,Pauline Mother [phone]  12-07-12 1:30pm Avie Arenas, RNBSN [phone] Talked with patient, husband and mother in room.  Discussed rehab options.  Agreeable to ltach, understand [facility] is in network with their insurance.  Agreeable to tx to [facility] if insurance approves and aware it may be this week if approved.   CM will continue to follow.   Per physician patient will require at lease nocturnal vent at this time.  12-03-12 1:40pm Avie Arenas, RNBSN [phone] Trached today.  [employer] is in network for [facility] - [facility], but needs to be on trach for one week with documented 3 failed weans.  CM will continue to  follow.  12-02-12 10:15am Avie Arenas, RNBSN [phone] Fluid overload - on lasix drip.  Continues on vent.   12/01/12 0845 Arlyss Queen, RN BSN CM Pt to transfer to [facility] today.  11/26/12 Rosemary Holms RN BNS CM Pt remains Vent dependent  11/20/12 1500 Arlyss Queen, RN BSN CM

## 2012-11-20 NOTE — Progress Notes (Addendum)
INITIAL NUTRITION ASSESSMENT  DOCUMENTATION CODES Per approved criteria  -Morbid Obesity   INTERVENTION: MVI daily   NUTRITION DIAGNOSIS: Increased nutrient needs related to wound healing as evidenced by diabetic ulcer on lt leg.   Goal: Pt will meet >90% of estimated energy needs  Monitor:  PO intake, wt changes, labs, skin assessments, changes in status  Reason for Assessment: MD consult to assess nutrition requirements and status  52 y.o. female  Admitting Dx: Acute respiratory failure with hypoxia  ASSESSMENT: Pt admitted for SOB, respiratory failure, and fluid overload. She had seen outpatient PT for wound care PTA.  Noted progressive weight gain over the past few years (132#, 44% wt gain since 2010). Pt reports 50# (13%) wt gain x 1 month, which is clinically significant. Pt was recently prescribed Lasix by her PCP and with weeping edema on both legs.  Attempted to interview pt x 2, but unable to arouse. No family present during both visits.  Good intake; PO: 100%.   Height: Ht Readings from Last 1 Encounters:  11/19/12 5\' 8"  (1.727 m)    Weight: Wt Readings from Last 1 Encounters:  11/20/12 432 lb 4.8 oz (196.09 kg)    Ideal Body Weight: 140#  % Ideal Body Weight: 309%  Wt Readings from Last 10 Encounters:  11/20/12 432 lb 4.8 oz (196.09 kg)  03/23/08 300 lb (136.079 kg)    Usual Body Weight: 300#  % Usual Body Weight: 144%  BMI:  Body mass index is 65.75 kg/(m^2). Meets criteria for extreme obesity, class III.  Estimated Nutritional Needs: Kcal: 2600-2700 kcals daily Protein: 196-245 grams daily Fluid: 2.6-2.7 L daily  Skin:diabetic ulcer on lt leg; weeping edema on lt and rt legs  Diet Order: Cardiac  EDUCATION NEEDS: -Education not appropriate at this time   Intake/Output Summary (Last 24 hours) at 11/20/12 1334 Last data filed at 11/20/12 1300  Gross per 24 hour  Intake    360 ml  Output    500 ml  Net   -140 ml    Last BM:  11/19/2012   Labs:   Recent Labs Lab 11/19/12 1428 11/20/12 0526  NA 141 143  K 3.1* 3.4*  CL 93* 95*  CO2 40* 42*  BUN 14 11  CREATININE 1.03 0.80  CALCIUM 9.3 9.2  GLUCOSE 112* 132*    CBG (last 3)  No results found for this basename: GLUCAP,  in the last 72 hours  Scheduled Meds: . aspirin EC  81 mg Oral Daily  . enoxaparin (LOVENOX) injection  100 mg Subcutaneous Q24H  . furosemide  40 mg Intravenous Q8H  . losartan  100 mg Oral Daily  . nicotine  21 mg Transdermal Daily  . pantoprazole  80 mg Oral Daily  . sodium chloride  3 mL Intravenous Q12H    Continuous Infusions:   Past Medical History  Diagnosis Date  . Hypertension   . GERD (gastroesophageal reflux disease)     Past Surgical History  Procedure Laterality Date  . Back surgery    . Achilles tendon surgery      Smayan Hackbart A. Mayford Knife, RD, LDN Pager: [phone]

## 2012-11-20 NOTE — Progress Notes (Signed)
*  PRELIMINARY RESULTS* Echocardiogram 2D Echocardiogram has been performed.  Meghan Hoover 11/20/2012, 10:38 AM

## 2012-11-20 NOTE — Progress Notes (Signed)
CRITICAL VALUE ALERT  Critical value received:  CO2 106  Date of notification:  11/20/12  Time of notification:  2051  Critical value read back: yes  Nurse who received alert:  Foye Deer  MD notified (1st page):  Benedetto Coons NP  Time of first page:  2055  MD notified (2nd page):  Time of second page:  Responding MD:  Benedetto Coons NP  Time MD responded:  2056  Transferring to ICU and informing on-call hospitalist of patient status.

## 2012-11-20 NOTE — Progress Notes (Signed)
UR Chart Review Completed  

## 2012-11-20 NOTE — Progress Notes (Signed)
[facility] PROGRESS NOTE  Meghan Hoover RUE:[id number] DOB: Jan 14, 1961 DOA: 11/19/2012 PCP: Kirk Ruths, MD  Assessment/Plan: 1. Acute respiratory failure with hypoxia: Subjectively improved. Continues to require oxygen. Secondary to acute diastolic congestive heart failure. Continue oxygen and Lasix. 2. Acute diastolic congestive heart failure: Complicated by moderate pulmonary hypertension and cor pulmonale. Presumably secondary to long-term smoking. Continue diuretics. Check urine output, daily weights. 3. Hypertension: Stable. 4. Hypokalemia: improved, continue replacement 5. Normocytic anemia: Stable. Etiology unclear. Followup as an outpatient. 6. Morbid obesity: Appreciate dietitian consultation. 7. Tobacco dependence: patient has no desire to quit    Replace potassium  Increase Lasix frequency. Daily weights, track urine output.  Wean oxygen as tolerated.  Pending studies:   none  Code Status: full code DVT prophylaxis: Lovenox Family Communication: none present Disposition Plan: home when improved  Brendia Sacks, MD  [facility]  Pager [phone] If 7PM-7AM, please contact night-coverage at [email or web address], password [facility] 11/20/2012, 2:17 PM  LOS: 1 day   Summary: 52 year old woman presented to the emergency department with increasing shortness of breath. Initial evaluation was notable for hypoxic respiratory failure, massive volume overload from feet up to abdomen suggesting CHF.  Consultants:    Procedures:  2-D echocardiogram: LVEF 65-70%; Grade 2 diastolic dysfunction. Moderate pulmonary HTN, possible cor pulmonale.  HPI/Subjective: Feels about the same. Still somewhat SOB. Voiding well. Still edematous.   Objective: Filed Vitals:   11/19/12 2014 11/20/12 0653 11/20/12 0717 11/20/12 1402  BP:  135/66  107/65  Pulse:  101  96  Temp:  98.9 F (37.2 C)  99.5 F (37.5 C)  TempSrc:  Oral  Oral  Resp:  20  20  Height:      Weight:    196.09 kg (432 lb 4.8 oz)   SpO2: 94% 92%  88%    Intake/Output Summary (Last 24 hours) at 11/20/12 1417 Last data filed at 11/20/12 1300  Gross per 24 hour  Intake    360 ml  Output    500 ml  Net   -140 ml     Filed Weights   11/19/12 1727 11/19/12 1821 11/20/12 0717  Weight: 172.367 kg (380 lb) 196.271 kg (432 lb 11.2 oz) 196.09 kg (432 lb 4.8 oz)    Exam:   Afebrile, VSS. Remains hypoxic without oxygen supplementation.  General: Appears calm and comfortable. Sitting in chair.  Psychiatric: Grossly normal mood and affect. Speech fluent and appropriate.  Cardiovascular: Regular rate and rhythm. No murmur, rub or gallop. No change in massive lower extremity edema.  Respiratory: Clear to auscultation bilaterally. No wheezes, rales or rhonchi. Decreased breath sounds. Fair air movement. Speaks in full sentences.  Lower abdominal edema remains  Data Reviewed:  No significant change in weight.  Basic metabolic panel with mild hypokalemia. BUN and creatinine preserved. Elevated CO2 consistent with contraction alkalosis versus chronic hypercarbic respiratory failure.  Scheduled Meds: . aspirin EC  81 mg Oral Daily  . enoxaparin (LOVENOX) injection  100 mg Subcutaneous Q24H  . furosemide  40 mg Intravenous Q8H  . losartan  100 mg Oral Daily  . multivitamin with minerals  1 tablet Oral Daily  . nicotine  21 mg Transdermal Daily  . pantoprazole  80 mg Oral Daily  . sodium chloride  3 mL Intravenous Q12H   Continuous Infusions:   Principal Problem:   Acute respiratory failure with hypoxia Active Problems:   Volume overload   Bilateral lower extremity edema   HTN (hypertension)   Normocytic anemia  Tobacco dependence   Acute diastolic congestive heart failure   Time spent 15 minutes

## 2012-11-21 ENCOUNTER — Inpatient Hospital Stay (HOSPITAL_COMMUNITY): Payer: BC Managed Care – PPO

## 2012-11-21 ENCOUNTER — Encounter (HOSPITAL_COMMUNITY): Payer: BC Managed Care – PPO | Admitting: Anesthesiology

## 2012-11-21 ENCOUNTER — Encounter (HOSPITAL_COMMUNITY): Payer: BC Managed Care – PPO

## 2012-11-21 ENCOUNTER — Inpatient Hospital Stay (HOSPITAL_COMMUNITY): Payer: BC Managed Care – PPO | Admitting: Anesthesiology

## 2012-11-21 DIAGNOSIS — J189 Pneumonia, unspecified organism: Secondary | ICD-10-CM

## 2012-11-21 DIAGNOSIS — A419 Sepsis, unspecified organism: Secondary | ICD-10-CM

## 2012-11-21 LAB — LACTIC ACID, PLASMA: Lactic Acid, Venous: 0.9 mmol/L (ref 0.5–2.2)

## 2012-11-21 LAB — BLOOD GAS, ARTERIAL
Acid-Base Excess: 16.9 mmol/L — ABNORMAL HIGH (ref 0.0–2.0)
Acid-Base Excess: 20.2 mmol/L — ABNORMAL HIGH (ref 0.0–2.0)
Bicarbonate: 44.9 mEq/L — ABNORMAL HIGH (ref 20.0–24.0)
Bicarbonate: 45.2 mEq/L — ABNORMAL HIGH (ref 20.0–24.0)
Bicarbonate: 45.5 mEq/L — ABNORMAL HIGH (ref 20.0–24.0)
Delivery systems: POSITIVE
FIO2: 65 %
FIO2: 70 %
Inspiratory PAP: 20
O2 Saturation: 87.4 %
O2 Saturation: 90.6 %
O2 Saturation: 92.5 %
Patient temperature: 37
Patient temperature: 37
RATE: 16 resp/min
RATE: 16 resp/min
TCO2: 41.6 mmol/L (ref 0–100)
TCO2: 41.7 mmol/L (ref 0–100)
TCO2: 43.7 mmol/L (ref 0–100)
pH, Arterial: 7.208 — ABNORMAL LOW (ref 7.350–7.450)
pO2, Arterial: 49.9 mmHg — ABNORMAL LOW (ref 80.0–100.0)

## 2012-11-21 LAB — BASIC METABOLIC PANEL
BUN: 9 mg/dL (ref 6–23)
CO2: 45 mEq/L (ref 19–32)
Chloride: 91 mEq/L — ABNORMAL LOW (ref 96–112)
Creatinine, Ser: 0.89 mg/dL (ref 0.50–1.10)
Glucose, Bld: 102 mg/dL — ABNORMAL HIGH (ref 70–99)

## 2012-11-21 MED ORDER — ETOMIDATE 2 MG/ML IV SOLN
INTRAVENOUS | Status: DC | PRN
  Administered 2012-11-21: 10 mg via INTRAVENOUS

## 2012-11-21 MED ORDER — ALBUTEROL SULFATE (5 MG/ML) 0.5% IN NEBU
2.5000 mg | INHALATION_SOLUTION | RESPIRATORY_TRACT | Status: DC
  Administered 2012-11-21 – 2012-12-01 (×60): 2.5 mg via RESPIRATORY_TRACT
  Filled 2012-11-21 (×59): qty 0.5

## 2012-11-21 MED ORDER — PANTOPRAZOLE SODIUM 40 MG IV SOLR
40.0000 mg | Freq: Every day | INTRAVENOUS | Status: DC
  Administered 2012-11-21 – 2012-12-03 (×13): 40 mg via INTRAVENOUS
  Filled 2012-11-21 (×13): qty 40

## 2012-11-21 MED ORDER — DEXTROSE 5 % IV SOLN
500.0000 mg | INTRAVENOUS | Status: DC
  Administered 2012-11-21 – 2012-11-26 (×6): 500 mg via INTRAVENOUS
  Filled 2012-11-21 (×7): qty 500

## 2012-11-21 MED ORDER — SODIUM CHLORIDE 0.9 % IJ SOLN
10.0000 mL | Freq: Two times a day (BID) | INTRAMUSCULAR | Status: DC
  Administered 2012-11-21: 10 mL
  Administered 2012-11-21: 13 mL
  Administered 2012-11-22 – 2012-11-23 (×3): 10 mL
  Administered 2012-11-24: 20 mL
  Administered 2012-11-24: 10 mL
  Administered 2012-11-25: 13 mL
  Administered 2012-11-25 – 2012-11-28 (×5): 10 mL
  Administered 2012-11-30: 20 mL
  Administered 2012-11-30 – 2012-12-02 (×2): 10 mL

## 2012-11-21 MED ORDER — PROPOFOL 10 MG/ML IV EMUL
INTRAVENOUS | Status: AC
  Administered 2012-11-21: 25 ug/kg/min via INTRAVENOUS
  Filled 2012-11-21: qty 100

## 2012-11-21 MED ORDER — BIOTENE DRY MOUTH MT LIQD
15.0000 mL | Freq: Four times a day (QID) | OROMUCOSAL | Status: DC
  Administered 2012-11-21 – 2012-12-09 (×73): 15 mL via OROMUCOSAL

## 2012-11-21 MED ORDER — ACETAMINOPHEN 650 MG RE SUPP
650.0000 mg | RECTAL | Status: DC | PRN
  Filled 2012-11-21 (×2): qty 1

## 2012-11-21 MED ORDER — VANCOMYCIN HCL 10 G IV SOLR
2500.0000 mg | Freq: Once | INTRAVENOUS | Status: AC
  Administered 2012-11-21: 2500 mg via INTRAVENOUS
  Filled 2012-11-21: qty 2500

## 2012-11-21 MED ORDER — PROPOFOL 10 MG/ML IV EMUL
5.0000 ug/kg/min | INTRAVENOUS | Status: DC
  Administered 2012-11-21: 20 ug/kg/min via INTRAVENOUS
  Administered 2012-11-21: 27.962 ug/kg/min via INTRAVENOUS
  Administered 2012-11-21: 25 ug/kg/min via INTRAVENOUS
  Administered 2012-11-21: 26 ug/kg/min via INTRAVENOUS
  Administered 2012-11-21: 27.027 ug/kg/min via INTRAVENOUS
  Administered 2012-11-21: 26 ug/kg/min via INTRAVENOUS
  Administered 2012-11-22: 30 ug/kg/min via INTRAVENOUS
  Administered 2012-11-22: 28 ug/kg/min via INTRAVENOUS
  Administered 2012-11-22: 30 ug/kg/min via INTRAVENOUS
  Administered 2012-11-22: 45 ug/kg/min via INTRAVENOUS
  Administered 2012-11-22: 30 ug/kg/min via INTRAVENOUS
  Administered 2012-11-22: 30.087 ug/kg/min via INTRAVENOUS
  Administered 2012-11-22 – 2012-11-23 (×4): 30 ug/kg/min via INTRAVENOUS
  Administered 2012-11-23: 35.696 ug/kg/min via INTRAVENOUS
  Administered 2012-11-23: 32 ug/kg/min via INTRAVENOUS
  Administered 2012-11-23: 34.846 ug/kg/min via INTRAVENOUS
  Administered 2012-11-23: 32 ug/kg/min via INTRAVENOUS
  Administered 2012-11-23: 33.996 ug/kg/min via INTRAVENOUS
  Administered 2012-11-23: 34.846 ug/kg/min via INTRAVENOUS
  Administered 2012-11-23: 35.696 ug/kg/min via INTRAVENOUS
  Administered 2012-11-23: 41 ug/kg/min via INTRAVENOUS
  Administered 2012-11-24: 30 ug/kg/min via INTRAVENOUS
  Administered 2012-11-24: 41.645 ug/kg/min via INTRAVENOUS
  Administered 2012-11-24: 34.846 ug/kg/min via INTRAVENOUS
  Administered 2012-11-24: 49 ug/kg/min via INTRAVENOUS
  Administered 2012-11-24: 38.246 ug/kg/min via INTRAVENOUS
  Administered 2012-11-24: 35 ug/kg/min via INTRAVENOUS
  Administered 2012-11-24: 40.201 ug/kg/min via INTRAVENOUS
  Administered 2012-11-24: 40 ug/kg/min via INTRAVENOUS
  Administered 2012-11-24 (×2): 35 ug/kg/min via INTRAVENOUS
  Administered 2012-11-25 (×2): 15.298 ug/kg/min via INTRAVENOUS
  Administered 2012-11-25: 20 ug/kg/min via INTRAVENOUS
  Administered 2012-11-25: 25 ug/kg/min via INTRAVENOUS
  Administered 2012-11-25: 21.248 ug/kg/min via INTRAVENOUS
  Administered 2012-11-26: 30 ug/kg/min via INTRAVENOUS
  Administered 2012-11-26: 20 ug/kg/min via INTRAVENOUS
  Administered 2012-11-26: 30 ug/kg/min via INTRAVENOUS
  Administered 2012-11-26: 20 ug/kg/min via INTRAVENOUS
  Administered 2012-11-26 – 2012-11-27 (×5): 30 ug/kg/min via INTRAVENOUS
  Administered 2012-11-27: 40 ug/kg/min via INTRAVENOUS
  Administered 2012-11-27: 30 ug/kg/min via INTRAVENOUS
  Administered 2012-11-27: 40 ug/kg/min via INTRAVENOUS
  Administered 2012-11-27 – 2012-11-28 (×6): 30 ug/kg/min via INTRAVENOUS
  Administered 2012-11-28 (×4): 40 ug/kg/min via INTRAVENOUS
  Administered 2012-11-28: 30 ug/kg/min via INTRAVENOUS
  Administered 2012-11-29: 40 ug/kg/min via INTRAVENOUS
  Administered 2012-11-29 (×4): 35 ug/kg/min via INTRAVENOUS
  Administered 2012-11-29: 30 ug/kg/min via INTRAVENOUS
  Administered 2012-11-29 (×2): 35 ug/kg/min via INTRAVENOUS
  Administered 2012-11-29: 25 ug/kg/min via INTRAVENOUS
  Administered 2012-11-30 (×2): 35 ug/kg/min via INTRAVENOUS
  Administered 2012-11-30: 33.146 ug/kg/min via INTRAVENOUS
  Administered 2012-11-30: 33.401 ug/kg/min via INTRAVENOUS
  Administered 2012-11-30: 24.647 ug/kg/min via INTRAVENOUS
  Administered 2012-11-30 (×2): 33.146 ug/kg/min via INTRAVENOUS
  Administered 2012-11-30: 35 ug/kg/min via INTRAVENOUS
  Administered 2012-12-01: 39 ug/kg/min via INTRAVENOUS
  Administered 2012-12-01: 25 ug/kg/min via INTRAVENOUS
  Administered 2012-12-01 (×2): 33.146 ug/kg/min via INTRAVENOUS
  Administered 2012-12-01: 50 ug/kg/min via INTRAVENOUS
  Administered 2012-12-01: 45 ug/kg/min via INTRAVENOUS
  Administered 2012-12-01: 35 ug/kg/min via INTRAVENOUS
  Administered 2012-12-01: 33.146 ug/kg/min via INTRAVENOUS
  Administered 2012-12-02 (×2): 15 ug/kg/min via INTRAVENOUS
  Administered 2012-12-03: 35 ug/kg/min via INTRAVENOUS
  Filled 2012-11-21: qty 100
  Filled 2012-11-21: qty 200
  Filled 2012-11-21: qty 100
  Filled 2012-11-21: qty 200
  Filled 2012-11-21 (×3): qty 100
  Filled 2012-11-21: qty 200
  Filled 2012-11-21 (×4): qty 100
  Filled 2012-11-21: qty 200
  Filled 2012-11-21 (×5): qty 100
  Filled 2012-11-21: qty 500
  Filled 2012-11-21 (×6): qty 100
  Filled 2012-11-21: qty 300
  Filled 2012-11-21 (×6): qty 100
  Filled 2012-11-21: qty 200
  Filled 2012-11-21 (×3): qty 100
  Filled 2012-11-21: qty 200
  Filled 2012-11-21 (×2): qty 100
  Filled 2012-11-21: qty 200
  Filled 2012-11-21 (×4): qty 100
  Filled 2012-11-21: qty 200
  Filled 2012-11-21 (×5): qty 100
  Filled 2012-11-21: qty 200
  Filled 2012-11-21: qty 100
  Filled 2012-11-21: qty 200
  Filled 2012-11-21 (×3): qty 100
  Filled 2012-11-21: qty 200
  Filled 2012-11-21 (×5): qty 100
  Filled 2012-11-21: qty 200
  Filled 2012-11-21 (×9): qty 100
  Filled 2012-11-21: qty 300
  Filled 2012-11-21: qty 100
  Filled 2012-11-21: qty 500
  Filled 2012-11-21: qty 200
  Filled 2012-11-21: qty 100

## 2012-11-21 MED ORDER — SUCCINYLCHOLINE CHLORIDE 20 MG/ML IJ SOLN
INTRAMUSCULAR | Status: DC | PRN
  Administered 2012-11-21: 100 mg via INTRAVENOUS

## 2012-11-21 MED ORDER — SODIUM CHLORIDE 0.9 % IJ SOLN
3.0000 mL | INTRAMUSCULAR | Status: DC | PRN
Start: 2012-11-21 — End: 2012-12-02
  Administered 2012-11-22: 3 mL via INTRAVENOUS

## 2012-11-21 MED ORDER — SODIUM CHLORIDE 0.9 % IJ SOLN
10.0000 mL | INTRAMUSCULAR | Status: DC | PRN
  Administered 2012-11-23: 10 mL

## 2012-11-21 MED ORDER — SODIUM CHLORIDE 0.9 % IJ SOLN: 10.0000 mL | INTRAMUSCULAR | Status: DC | PRN

## 2012-11-21 MED ORDER — IPRATROPIUM BROMIDE 0.02 % IN SOLN
0.5000 mg | RESPIRATORY_TRACT | Status: DC
  Administered 2012-11-21 – 2012-12-01 (×60): 0.5 mg via RESPIRATORY_TRACT
  Filled 2012-11-21 (×59): qty 2.5

## 2012-11-21 MED ORDER — CHLORHEXIDINE GLUCONATE 0.12 % MT SOLN
15.0000 mL | Freq: Two times a day (BID) | OROMUCOSAL | Status: DC
  Administered 2012-11-21 – 2012-12-09 (×38): 15 mL via OROMUCOSAL
  Filled 2012-11-21 (×40): qty 15

## 2012-11-21 MED ORDER — VANCOMYCIN HCL 10 G IV SOLR
1500.0000 mg | Freq: Two times a day (BID) | INTRAVENOUS | Status: DC
  Administered 2012-11-21 – 2012-11-22 (×3): 1500 mg via INTRAVENOUS
  Filled 2012-11-21 (×6): qty 1500

## 2012-11-21 MED ORDER — SODIUM CHLORIDE 0.9 % IV SOLN
INTRAVENOUS | Status: DC
  Administered 2012-11-21 – 2012-12-09 (×8): via INTRAVENOUS

## 2012-11-21 MED ORDER — FENTANYL CITRATE 0.05 MG/ML IJ SOLN
25.0000 ug | INTRAMUSCULAR | Status: DC | PRN
  Administered 2012-11-21 – 2012-11-24 (×17): 100 ug via INTRAVENOUS
  Administered 2012-11-25: 25 ug via INTRAVENOUS
  Administered 2012-11-25: 50 ug via INTRAVENOUS
  Filled 2012-11-21 (×17): qty 2

## 2012-11-21 MED ORDER — DEXTROSE 5 % IV SOLN
1.0000 g | INTRAVENOUS | Status: AC
  Administered 2012-11-21 – 2012-11-27 (×7): 1 g via INTRAVENOUS
  Filled 2012-11-21 (×7): qty 10

## 2012-11-21 NOTE — Progress Notes (Signed)
NUTRITION FOLLOW UP  Intervention:   Recommend start enteral nutrition support within 48 hours of intubation.  Also recommend adjusting rate of TF with rate of propofol.  Nutrition Dx:   New nutrition dx: Inadequate oral food intake r/t inability to eat AEB ventilator support.   Goal:   Enteral nutrition to provide 60-70% of estimated calorie needs (22-25 kcals/kg ideal body weight) and 100% of estimated protein needs, based on ASPEN guidelines for permissive underfeeding in critically ill obese individuals. (Goal for pt are current body weight are 1400-1590 kcals daily and 159-191 grams protein daily per ASPEN underfeeding guidelines)  Monitor:   TF initiation, advancement, and tolerance, skin assessments, labs, weight changes, changes in status  Assessment:   Pt was assessed yesterday. Pt was intubated today due to respiratory failure and failed Bipap.   Patient is currently intubated on ventilator support.  MV: 8.2 L/min Temp:Temp (24hrs), Avg:99.1 F (37.3 C), Min:98.8 F (37.1 C), Max:99.5 F (37.5 C)  Propofol: 58.8 ml/hr (1552.32)  Pt is meeting 100% of estimated calorie needs with current propofol dose.   Per MD notes, weaning is not expected for the next 48 hours.   Height: Ht Readings from Last 1 Encounters:  11/19/12 5\' 8"  (1.727 m)    Weight Status:   Wt Readings from Last 1 Encounters:  11/20/12 432 lb 4.8 oz (196.09 kg)    Re-estimated needs:  Kcal: 2749.8 kcals daily Protein: 245-294 grams protein daily Fluid: 2.7-2.8 L daily  Skin: diabetic ulcer on lt leg; weeping edema on rt and lt legs  Diet Order: NPO   Intake/Output Summary (Last 24 hours) at 11/21/12 1338 Last data filed at 11/21/12 0800  Gross per 24 hour  Intake 191.47 ml  Output   2900 ml  Net -2708.53 ml    Last BM: 11/19/12   Labs:   Recent Labs Lab 11/19/12 1428 11/20/12 0526 11/21/12 0506  NA 141 143 141  K 3.1* 3.4* 3.5  CL 93* 95* 91*  CO2 40* 42* 45*  BUN 14 11  9   CREATININE 1.03 0.80 0.89  CALCIUM 9.3 9.2 9.0  GLUCOSE 112* 132* 102*    CBG (last 3)  No results found for this basename: GLUCAP,  in the last 72 hours  Scheduled Meds: . ipratropium  0.5 mg Nebulization Q4H   And  . albuterol  2.5 mg Nebulization Q4H  . antiseptic oral rinse  15 mL Mouth Rinse QID  . aspirin EC  81 mg Oral Daily  . azithromycin  500 mg Intravenous Q24H  . cefTRIAXone (ROCEPHIN)  IV  1 g Intravenous Q24H  . chlorhexidine  15 mL Mouth Rinse BID  . enoxaparin (LOVENOX) injection  100 mg Subcutaneous Q24H  . furosemide  40 mg Intravenous Q8H  . losartan  100 mg Oral Daily  . multivitamin with minerals  1 tablet Oral Daily  . nicotine  21 mg Transdermal Daily  . pantoprazole (PROTONIX) IV  40 mg Intravenous Daily  . sodium chloride  3 mL Intravenous Q12H  . [START ON 11/22/2012] vancomycin  1,500 mg Intravenous Q12H  . vancomycin  2,500 mg Intravenous Once    Continuous Infusions: . propofol 36 mcg/kg/min (11/21/12 1158)    Jacqueline Spofford A. Mayford Knife, RD, LDN Pager: [phone]

## 2012-11-21 NOTE — Addendum Note (Signed)
Addendum created 11/21/12 1034 by Franco Nones, CRNA   Modules edited: Anesthesia Events

## 2012-11-21 NOTE — Progress Notes (Signed)
Patient has been drowsy to increasingly lethargic throughout the shift.  The patient would answer simple questions but doze in the middle of answering.  Patient was increasingly weak to get assist to the bathroom for the aggressive amount of lasix being taken.  ABG's were drawn after notifying the doctor and a critical value was found.  The patient and family were informed that her status would require her to be monitored in ICU/step down due to her decreasing status.  Report was called to ICU RN and patient was transported via oxygen with NT and RN in stable condition via the bed.

## 2012-11-21 NOTE — Progress Notes (Signed)
ANTIBIOTIC CONSULT NOTE - INITIAL  Pharmacy Consult for Vancomycin & Renal Adjustment Antibiotics Indication: pneumonia  Allergies  Allergen Reactions  . Augmentin [Amoxicillin-Pot Clavulanate] Other (See Comments)    NOT SURE/ IT'S BEEN AWHILE    Patient Measurements: Height: 5\' 8"  (172.7 cm) Weight: 432 lb 4.8 oz (196.09 kg) IBW/kg (Calculated) : 63.9 Adjusted Body Weight:   Vital Signs: Temp: 98.8 F (37.1 C) (11/01 0400) Temp src: Oral (11/01 0400) BP: 116/55 mmHg (11/01 0800) Pulse Rate: 73 (11/01 0800) Intake/Output from previous day: 10/31 0701 - 11/01 0700 In: 455.8 [P.O.:360; I.V.:95.8] Out: 2900 [Urine:2900] Intake/Output from this shift: Total I/O In: 95.7 [I.V.:95.7] Out: -   Labs:  Recent Labs  11/19/12 1428 11/20/12 0526 11/21/12 0506  WBC 9.9 11.1*  --   HGB 11.1* 11.0*  --   PLT 313 347  --   CREATININE 1.03 0.80 0.89   Estimated Creatinine Clearance: 134.8 ml/min (by C-G formula based on Cr of 0.89). No results found for this basename: VANCOTROUGH, Leodis Binet, VANCORANDOM, GENTTROUGH, GENTPEAK, GENTRANDOM, TOBRATROUGH, TOBRAPEAK, TOBRARND, AMIKACINPEAK, AMIKACINTROU, AMIKACIN,  in the last 72 hours   Microbiology: Recent Results (from the past 720 hour(s))  MRSA PCR SCREENING     Status: None   Collection Time    11/20/12  9:29 PM      Result Value Range Status   MRSA by PCR NEGATIVE  NEGATIVE Final   Comment:            The GeneXpert MRSA Assay (FDA     approved for NASAL specimens     only), is one component of a     comprehensive MRSA colonization     surveillance program. It is not     intended to diagnose MRSA     infection nor to guide or     monitor treatment for     MRSA infections.    Medical History: Past Medical History  Diagnosis Date  . Hypertension   . GERD (gastroesophageal reflux disease)     Medications:  Scheduled:  . antiseptic oral rinse  15 mL Mouth Rinse QID  . aspirin EC  81 mg Oral Daily  .  azithromycin  500 mg Intravenous Q24H  . cefTRIAXone (ROCEPHIN)  IV  1 g Intravenous Q24H  . chlorhexidine  15 mL Mouth Rinse BID  . enoxaparin (LOVENOX) injection  100 mg Subcutaneous Q24H  . furosemide  40 mg Intravenous Q8H  . losartan  100 mg Oral Daily  . multivitamin with minerals  1 tablet Oral Daily  . nicotine  21 mg Transdermal Daily  . pantoprazole (PROTONIX) IV  40 mg Intravenous Daily  . sodium chloride  3 mL Intravenous Q12H  . [START ON 11/22/2012] vancomycin  1,500 mg Intravenous Q12H  . vancomycin  2,500 mg Intravenous Once   Assessment: Morbid obesity Excellent renal function Pneumonia, possibly developing sepsis Rocephin 1 GM IV every 24 hours and Azithromycin 500 mg IV every 24 hours by MD  Goal of Therapy:  Vancomycin trough level 15-20 mcg/ml  Plan:  Vancomycin 2500 mg loading dose, then 1500 mg IV every 12 hours Vancomycin trough at steady state Continue Rocephin 1 GM IV every 24 hr Continue Azithromycin 500 mg IV every 24 hours Monitor renal function Labs per protocol  Raquel James, Luevenia Mcavoy Bennett 11/21/2012,9:10 AM

## 2012-11-21 NOTE — Progress Notes (Signed)
PH of 7.20 and PCO2 of 118 reported to MD.  MD into room to talk to family about possible intubation Patient is lethargic and arousable to sternal rub only

## 2012-11-21 NOTE — Progress Notes (Signed)
eLink Physician-Brief Progress Note Patient Name: TAMZIN BERTLING DOB: 1960-07-04 MRN: [id number]  Date of Service  11/21/2012   HPI/Events of Note  Patient with HCRF failed BiPAP.  Intubated.   eICU Interventions  Plan: Vent and sedation orders written Evaluate PCXR/ABG s/p intubation   Intervention Category Major Interventions: Respiratory failure - evaluation and management  DETERDING,ELIZABETH 11/21/2012, 1:32 AM

## 2012-11-21 NOTE — Progress Notes (Signed)
Critical CO2 on abg of 70.3  MD made aware via text page at [phone]

## 2012-11-21 NOTE — Consult Note (Signed)
Consult requested by: Dr. Irene Limbo Consult requested for : Respiratory failure  HPI: This is a 52 year old Caucasian female who came to the emergency department with increasing shortness of breath. History is obtained from the medical record and from her son. The patient is intubated and sedated and is not able to provide any history. According to her son she's had increasing shortness of breath for several weeks. She has gained a significant amount of weight. She has about a 80-pack-year smoking history and has continued smoking. Her functional status is that she can ambulate only very short distances 5-10 feet without becoming extremely dyspneic. She's had some cough but not a lot. Her son feels that she's had apneic episodes. She has had some symptoms of depression according to the son. She has had leg ulcerations and has been referred to wound clinic. Apparently she has not been told in the past but she has COPD.  Past Medical History  Diagnosis Date  . Hypertension   . GERD (gastroesophageal reflux disease)      Family History  Problem Relation Age of Onset  . Diabetes Sister      History   Social History  . Marital Status: Married    Spouse Name: N/A    Number of Children: N/A  . Years of Education: N/A   Social History Main Topics  . Smoking status: Current Every Day Smoker -- 2.00 packs/day    Types: Cigarettes  . Smokeless tobacco: None  . Alcohol Use: No  . Drug Use: No  . Sexual Activity: None   Other Topics Concern  . None   Social History Narrative  . None     ROS: Unobtainable except as above    Objective: Vital signs in last 24 hours: Temp:  [98.8 F (37.1 C)-99.5 F (37.5 C)] 98.8 F (37.1 C) (11/01 0400) Pulse Rate:  [68-102] 73 (11/01 0800) Resp:  [14-28] 16 (11/01 0800) BP: (80-165)/(38-74) 116/55 mmHg (11/01 0800) SpO2:  [81 %-100 %] 93 % (11/01 0800) FiO2 (%):  [50 %-70 %] 70 % (11/01 0750) Weight change: 23.723 kg (52 lb 4.8 oz) Last BM  Date: 11/19/12  Intake/Output from previous day: 10/31 0701 - 11/01 0700 In: 455.8 [P.O.:360; I.V.:95.8] Out: 2900 [Urine:2900]  PHYSICAL EXAM She is a morbidly obese Caucasian female intubated sedated and on the ventilator. Her pupils do react. Her mucous membranes are moist. Nose and throat clear. Her chest shows markedly diminished breath sounds with prolonged expiration. Heart sounds are somewhat distant but I do not hear a gallop. Her abdomen is soft obese nontender. She has pitting edema in her abdominal wall. Her extremities show that she has 2-3+ edema and she has leg ulceration on the left. She has chronic venous stasis changes in both lower extremities. She is sedated but has apparently been neurologically intact.  Lab Results: Basic Metabolic Panel:  Recent Labs  21/30/86 0526 11/21/12 0506  NA 143 141  K 3.4* 3.5  CL 95* 91*  CO2 42* 45*  GLUCOSE 132* 102*  BUN 11 9  CREATININE 0.80 0.89  CALCIUM 9.2 9.0   Liver Function Tests: No results found for this basename: AST, ALT, ALKPHOS, BILITOT, PROT, ALBUMIN,  in the last 72 hours No results found for this basename: LIPASE, AMYLASE,  in the last 72 hours No results found for this basename: AMMONIA,  in the last 72 hours CBC:  Recent Labs  11/19/12 1428 11/20/12 0526  WBC 9.9 11.1*  NEUTROABS 5.6  --  HGB 11.1* 11.0*  HCT 40.6 40.7  MCV 78.8 80.6  PLT 313 347   Cardiac Enzymes:  Recent Labs  11/19/12 1428  TROPONINI <0.30   BNP:  Recent Labs  11/19/12 1428  PROBNP 3418.0*   D-Dimer: No results found for this basename: DDIMER,  in the last 72 hours CBG: No results found for this basename: GLUCAP,  in the last 72 hours Hemoglobin A1C: No results found for this basename: HGBA1C,  in the last 72 hours Fasting Lipid Panel: No results found for this basename: CHOL, HDL, LDLCALC, TRIG, CHOLHDL, LDLDIRECT,  in the last 72 hours Thyroid Function Tests: No results found for this basename: TSH, T4TOTAL,  FREET4, T3FREE, THYROIDAB,  in the last 72 hours Anemia Panel: No results found for this basename: VITAMINB12, FOLATE, FERRITIN, TIBC, IRON, RETICCTPCT,  in the last 72 hours Coagulation:  Recent Labs  11/19/12 1428  LABPROT 14.4  INR 1.14   Urine Drug Screen: Drugs of Abuse  No results found for this basename: labopia, cocainscrnur, labbenz, amphetmu, thcu, labbarb    Alcohol Level: No results found for this basename: ETH,  in the last 72 hours Urinalysis: No results found for this basename: COLORURINE, APPERANCEUR, LABSPEC, PHURINE, GLUCOSEU, HGBUR, BILIRUBINUR, KETONESUR, PROTEINUR, UROBILINOGEN, NITRITE, LEUKOCYTESUR,  in the last 72 hours Misc. Labs:   ABGS:  Recent Labs  11/21/12 0820  PHART 7.486*  PO2ART 49.9*  TCO2 41.6  HCO3 45.5*     MICROBIOLOGY: Recent Results (from the past 240 hour(s))  MRSA PCR SCREENING     Status: None   Collection Time    11/20/12  9:29 PM      Result Value Range Status   MRSA by PCR NEGATIVE  NEGATIVE Final   Comment:            The GeneXpert MRSA Assay (FDA     approved for NASAL specimens     only), is one component of a     comprehensive MRSA colonization     surveillance program. It is not     intended to diagnose MRSA     infection nor to guide or     monitor treatment for     MRSA infections.    Studies/Results: Portable Chest Xray  11/21/2012   CLINICAL DATA:  Endotracheal tube placed  EXAM: PORTABLE CHEST - 1 VIEW  COMPARISON:  11/19/2012  FINDINGS: Endotracheal tube ends in the mid to lower thoracic trachea. Enteric tube crosses the diaphragm.  Chronic cardiopericardial enlargement. Worsening lung aeration diffusely with indistinct opacities, somewhat bandlike in the right lower lung. The interstitium is diffusely prominent. No effusion or visible pneumothorax.  IMPRESSION: 1. Good positioning of endotracheal and nasogastric tubes. 2. Worsening aeration since yesterday, which could represent atelectasis, aspiration  pneumonitis, or multi focal pneumonia. 3. Pulmonary venous congestion, possibly positional.   Electronically Signed   By: Tiburcio Pea M.D.   On: 11/21/2012 01:39   Dg Chest Port 1 View  11/19/2012   CLINICAL DATA:  Chest pain and hypertension.  EXAM: PORTABLE CHEST - 1 VIEW  COMPARISON:  02/29/2008  FINDINGS: Mild to moderately degraded exam due to AP portable technique and patient body habitus. Lower cervical spine fixation. Cardiomegaly accentuated by AP portable technique. No right and no definite left pleural effusion. No pneumothorax. Extremely low lung volumes, with resultant pulmonary interstitial prominence. No lobar consolidation. Suboptimal evaluation of the left lung base due to overlying soft tissues.  IMPRESSION: Mild to moderately degraded exam secondary to  patient body habitus and AP portable technique.  Given this factor, low lung volumes without definite acute disease.   Electronically Signed   By: Jeronimo Greaves M.D.   On: 11/19/2012 14:38    Medications:  Prior to Admission:  Prescriptions prior to admission  Medication Sig Dispense Refill  . betamethasone dipropionate (DIPROLENE) 0.05 % cream Apply 1 application topically 2 (two) times daily as needed. rash      . esomeprazole (NEXIUM) 40 MG capsule Take 40 mg by mouth daily before breakfast.      . furosemide (LASIX) 20 MG tablet Take 10 mg by mouth daily.       Marland Kitchen losartan (COZAAR) 100 MG tablet Take 100 mg by mouth daily.      . methocarbamol (ROBAXIN) 500 MG tablet Take 500 mg by mouth daily.        Scheduled: . antiseptic oral rinse  15 mL Mouth Rinse QID  . aspirin EC  81 mg Oral Daily  . azithromycin  500 mg Intravenous Q24H  . cefTRIAXone (ROCEPHIN)  IV  1 g Intravenous Q24H  . chlorhexidine  15 mL Mouth Rinse BID  . enoxaparin (LOVENOX) injection  100 mg Subcutaneous Q24H  . furosemide  40 mg Intravenous Q8H  . losartan  100 mg Oral Daily  . multivitamin with minerals  1 tablet Oral Daily  . nicotine  21 mg  Transdermal Daily  . pantoprazole (PROTONIX) IV  40 mg Intravenous Daily  . sodium chloride  3 mL Intravenous Q12H  . [START ON 11/22/2012] vancomycin  1,500 mg Intravenous Q12H  . vancomycin  2,500 mg Intravenous Once   Continuous: . propofol 28 mcg/kg/min (11/21/12 0800)   BJY:NWGNFA chloride, acetaminophen, acetaminophen, fentaNYL, nicotine polacrilex, ondansetron (ZOFRAN) IV, sodium chloride  Assesment: She has acute respiratory failure with hypoxia which I think is multifactorial. She clearly does have acute diastolic CHF. I think she has COPD based on smoking history and her continued shortness of breath. She has community-acquired pneumonia. She has some element I believe of obesity hypoventilation and of sleep apnea. Principal Problem:   Acute respiratory failure with hypoxia Active Problems:   Volume overload   Bilateral lower extremity edema   HTN (hypertension)   Normocytic anemia   Tobacco dependence   Acute diastolic congestive heart failure   CAP (community acquired pneumonia)   Sepsis    Plan: I think there's little opportunity to attempt weaning probably for the next 48 hours or so. She needs to have diuresis get the antibiotics on board et Karie Soda. I discussed this at bedside with her son.    LOS: 2 days   Thadd Apuzzo L 11/21/2012, 9:21 AM

## 2012-11-21 NOTE — Progress Notes (Signed)
Family back in room after successful intubation. OGT placed and verified and connected to LIWS Pt becoming more alert and reaching for tube. Diprivan sedation started.

## 2012-11-21 NOTE — Anesthesia Procedure Notes (Signed)
Procedure Name: Intubation Date/Time: 11/21/2012 1:12 AM Performed by: Franco Nones Pre-anesthesia Checklist: Patient identified, Emergency Drugs available, Suction available, Patient being monitored and Timeout performed Patient Re-evaluated:Patient Re-evaluated prior to inductionOxygen Delivery Method: Ambu bag Preoxygenation: Pre-oxygenation with 100% oxygen Intubation Type: IV induction Grade View: Grade I Tube type: Subglottic suction tube Number of attempts: 1 Airway Equipment and Method: Video-laryngoscopy and Stylet Placement Confirmation: ETT inserted through vocal cords under direct vision,  CO2 detector and breath sounds checked- equal and bilateral Secured at: 25 cm Dental Injury: Teeth and Oropharynx as per pre-operative assessment

## 2012-11-21 NOTE — Progress Notes (Signed)
[facility] PROGRESS NOTE  LATANIA BASCOMB WUJ:[id number] DOB: 06-22-1960 DOA: 11/19/2012 PCP: Kirk Ruths, MD  Assessment/Plan: 1. VDRF, acute respiratory acidosis: Superimposed on suspected chronic hypercarbic respiratory failure. Multifactorial: Developing pneumonia, acute diastolic heart failure. Suspect a component of OHS. No history of apnea per her husband. 2. Pneumonia, possible developing sepsis: I suspect this was developing on admission , initial chest x-ray was quite limited. Treat as community-acquired. Obtain sputum culture. 3. Acute diastolic congestive heart failure: Complicated by pulmonary hypertension, suspected cor pulmonale, long-term smoking. Good urine output. Continue diuretics. 4. Hypertension: Stable. 5. Normocytic anemia: Stable. Etiology unclear. Followup as an outpatient. 6. Morbid obesity 7. Tobacco dependence: Patient has no desire to quit.  Currently appears to be improving on ventilator. Long discussion with son and husband at bedside. We reviewed all imaging and laboratory studies, current diagnoses and treatment plan.   Continue Lasix. Daily weights, urine output and BMP.  Start antibiotics. Augmentin listed as an allergy, per husband this causes thrush, no history of hives, rash or anaphylaxis with any medication.  Consult pulmonology for assistance with ventilator management  Pending studies:   Sputum culture  Code Status: full code DVT prophylaxis: Lovenox Family Communication: none present Disposition Plan: home when improved  Brendia Sacks, MD  [facility]  Pager [phone] If 7PM-7AM, please contact night-coverage at [email or web address], password [facility] 11/21/2012, 8:41 AM  LOS: 2 days   Summary: 52 year old woman presented to the emergency department with increasing shortness of breath. Initial evaluation was notable for hypoxic respiratory failure, massive volume overload from feet up to abdomen suggesting  CHF.  Consultants:    Procedures:  2-D echocardiogram: LVEF 65-70%; Grade 2 diastolic dysfunction. Moderate pulmonary HTN, possible cor pulmonale.  HPI/Subjective: Overnight events noted. Patient became lethargic last night. ABG revealed acute respiratory or acidosis. Transfer to ICU, started on BiPAP, failed to improve, intubated. This morning remains intubated and sedated. Nurse reports new fever.  Objective: Filed Vitals:   11/21/12 0415 11/21/12 0430 11/21/12 0445 11/21/12 0500  BP: 110/58 121/63 113/57 119/66  Pulse: 72 74 71 73  Temp:      TempSrc:      Resp: 16 16 16 16   Height:      Weight:      SpO2: 95% 94% 93% 94%    Intake/Output Summary (Last 24 hours) at 11/21/12 0841 Last data filed at 11/21/12 0500  Gross per 24 hour  Intake 455.79 ml  Output   2900 ml  Net -2444.21 ml     Filed Weights   11/19/12 1727 11/19/12 1821 11/20/12 0717  Weight: 172.367 kg (380 lb) 196.271 kg (432 lb 11.2 oz) 196.09 kg (432 lb 4.8 oz)    Exam:   Febrile per RN. Hemodynamically stable. FiO2 70%, oxygen saturation mid 90s.  General: Appears calm, sedated, intubated.  Eyes: Pupils 2 mm, minimally reactive. Round.  ENT: Appears unremarkable.  Cardiovascular: Regular rate and rhythm. No murmur, rub or gallop. No significant change in massive lower extremity edema  Respiratory: Decreased air movement, few coarse breath sounds.   Abdomen: Soft.  Skin: Chronic lower extremity venous stasis changes.  Neurologic and psychiatric: Cannot assess  Data Reviewed:  Excellent urine output.  Basic metabolic panel unremarkable except for CO2 45  PH has improved from 7.2 >> >>  7.486  PCO2 106 >>  >> 60.8  PO2 49.9   Scheduled Meds: . antiseptic oral rinse  15 mL Mouth Rinse QID  . aspirin EC  81 mg Oral Daily  .  chlorhexidine  15 mL Mouth Rinse BID  . enoxaparin (LOVENOX) injection  100 mg Subcutaneous Q24H  . furosemide  40 mg Intravenous Q8H  . losartan  100 mg  Oral Daily  . multivitamin with minerals  1 tablet Oral Daily  . nicotine  21 mg Transdermal Daily  . pantoprazole (PROTONIX) IV  40 mg Intravenous Daily  . potassium chloride  40 mEq Oral Daily  . sodium chloride  3 mL Intravenous Q12H   Continuous Infusions: . propofol 28 mcg/kg/min (11/21/12 0745)    Principal Problem:   Acute respiratory failure with hypoxia Active Problems:   Volume overload   Bilateral lower extremity edema   HTN (hypertension)   Normocytic anemia   Tobacco dependence   Acute diastolic congestive heart failure   CAP (community acquired pneumonia)   Sepsis   Time spent 25 minutes

## 2012-11-22 ENCOUNTER — Inpatient Hospital Stay (HOSPITAL_COMMUNITY): Payer: BC Managed Care – PPO

## 2012-11-22 LAB — CBC
HCT: 36.7 % (ref 36.0–46.0)
Hemoglobin: 10.1 g/dL — ABNORMAL LOW (ref 12.0–15.0)
MCH: 21.4 pg — ABNORMAL LOW (ref 26.0–34.0)
Platelets: 293 10*3/uL (ref 150–400)
RBC: 4.71 MIL/uL (ref 3.87–5.11)
RDW: 20.8 % — ABNORMAL HIGH (ref 11.5–15.5)
WBC: 10.5 10*3/uL (ref 4.0–10.5)

## 2012-11-22 LAB — BLOOD GAS, ARTERIAL
Acid-Base Excess: 19.2 mmol/L — ABNORMAL HIGH (ref 0.0–2.0)
Bicarbonate: 44.5 mEq/L — ABNORMAL HIGH (ref 20.0–24.0)
Drawn by: 22223
FIO2: 70 %
O2 Saturation: 93.8 %
Patient temperature: 37
RATE: 16 resp/min
TCO2: 40.6 mmol/L (ref 0–100)
pCO2 arterial: 60.5 mmHg (ref 35.0–45.0)
pH, Arterial: 7.48 — ABNORMAL HIGH (ref 7.350–7.450)

## 2012-11-22 LAB — BASIC METABOLIC PANEL
Calcium: 8.9 mg/dL (ref 8.4–10.5)
Chloride: 91 mEq/L — ABNORMAL LOW (ref 96–112)
Creatinine, Ser: 0.89 mg/dL (ref 0.50–1.10)
GFR calc Af Amer: 84 mL/min — ABNORMAL LOW (ref >90)
GFR calc non Af Amer: 73 mL/min — ABNORMAL LOW (ref >90)
Sodium: 140 mEq/L (ref 135–145)

## 2012-11-22 LAB — LEGIONELLA ANTIGEN, URINE

## 2012-11-22 LAB — HIV ANTIBODY (ROUTINE TESTING W REFLEX): HIV: NONREACTIVE

## 2012-11-22 LAB — STREP PNEUMONIAE URINARY ANTIGEN: Strep Pneumo Urinary Antigen: NEGATIVE

## 2012-11-22 MED ORDER — ASPIRIN 81 MG PO CHEW
81.0000 mg | CHEWABLE_TABLET | Freq: Every day | ORAL | Status: DC
  Administered 2012-11-22 – 2012-12-09 (×18): 81 mg via ORAL
  Filled 2012-11-22 (×18): qty 1

## 2012-11-22 MED ORDER — VITAL AF 1.2 CAL PO LIQD
1000.0000 mL | ORAL | Status: DC
  Administered 2012-11-22: 1000 mL
  Filled 2012-11-22 (×3): qty 1000

## 2012-11-22 MED ORDER — POTASSIUM CHLORIDE 10 MEQ/100ML IV SOLN
10.0000 meq | INTRAVENOUS | Status: AC
  Administered 2012-11-22 (×4): 10 meq via INTRAVENOUS
  Filled 2012-11-22 (×4): qty 100

## 2012-11-22 NOTE — Progress Notes (Signed)
CRITICAL VALUE ALERT  Critical value received:  PCO2-60.5  Date of notification:  11/22/12  Time of notification:  0526  Critical value read back: yes  Nurse who received alert:  Foye Deer RN  MD notified (1st page):  Dr. Oralia Manis  Time of first page:  [phone]  MD notified (2nd page):  Time of second page:  Responding MD:  Dr. Orvan Falconer  Time MD responded:  No change 0630.

## 2012-11-22 NOTE — Progress Notes (Signed)
CRITICAL VALUE ALERT  Critical value received:  K 2.5 and CO2 45  Date of notification:  11/22/12  Time of notification:  0640  Critical value read back: yes  Nurse who received alert:  Foye Deer RN  MD notified (1st page):  Dr. Oralia Manis  Time of first page:  [phone]  MD notified (2nd page):  Time of second page:  Responding MD:  Dr. Oralia Manis  Time MD responded:  [phone]

## 2012-11-22 NOTE — Progress Notes (Signed)
[facility] PROGRESS NOTE  GORGEOUS NEWLUN AVW:[id number] DOB: 1960-06-04 DOA: 11/19/2012 PCP: Kirk Ruths, MD  Assessment/Plan: 1. VDRF, acute respiratory acidosis: PH has normalized. Multifactorial including community-acquired pneumonia, acute diastolic congestive heart failure, suspected obesity hypoventilation syndrome, suspected obstructive sleep apnea, suspected COPD without evidence of acute exacerbation. 2. Community acquired pneumonia, possible developing sepsis: Lactic acid was normal. Followup culture data. Continue empiric antibiotics. 3. Acute diastolic congestive heart failure: Complicated by pulmonary hypertension, suspected cor pulmonale, long-term smoking. Excellent urine output. Creatinine preserved. Continue diuretics. 4. Hypokalemia: Replete. 5. Hypertension: Stable. 6. Normocytic anemia: Stable. Etiology unclear. Followup as an outpatient. 7. Morbid obesity 8. Tobacco dependence: Patient has no desire to quit.  Appears stable on ventilator, continued high FiO2 requirement. Overall no significant change. Continue antibiotics, diuretics. Not ready to wean. Appreciate pulmonology recommendations. Discussed above with the son and husband at bedside. Patient remains critically ill.   Continue empiric antibiotics, followup culture data  No attempt to wean today  Continue diuretic therapy  Replace potassium  Initiate tube feeds  Pending studies:   Sputum culture  Blood cultures  Legionella antigen  Code Status: full code DVT prophylaxis: Lovenox Family Communication:  Disposition Plan: home when improved  Brendia Sacks, MD  [facility]  Pager [phone] If 7PM-7AM, please contact night-coverage at [email or web address], password [facility] 11/22/2012, 8:09 AM  LOS: 3 days   Summary: 52 year old woman presented to the emergency department with increasing shortness of breath. Initial evaluation was notable for hypoxic respiratory failure, massive volume  overload from feet up to abdomen suggesting CHF.  Consultants:  Pulmonology  Procedures:  2-D echocardiogram: LVEF 65-70%; Grade 2 diastolic dysfunction. Moderate pulmonary HTN, possible cor pulmonale.  11/1: PICC line placement  HPI/Subjective: No new issues overnight. Remains on 70% FiO2. PICC line was placed yesterday. Per family patient was alert and interactive last night. Remains sedated on vent.  Objective: Filed Vitals:   11/22/12 0427 11/22/12 0500 11/22/12 0600 11/22/12 0700  BP:  115/61 119/60 118/41  Pulse:  62 66 72  Temp:      TempSrc:      Resp:  18 15 19   Height:      Weight:      SpO2: 95% 92% 90% 89%    Intake/Output Summary (Last 24 hours) at 11/22/12 0809 Last data filed at 11/22/12 0415  Gross per 24 hour  Intake 2187.98 ml  Output   4775 ml  Net -2587.02 ml     Filed Weights   11/19/12 1821 11/20/12 0717 11/22/12 0249  Weight: 196.271 kg (432 lb 11.2 oz) 196.09 kg (432 lb 4.8 oz) 195 kg (429 lb 14.4 oz)    Exam:   Afebrile, vital signs are stable. FiO2 70%.  General: Sedated on ventilator. Appears calm.  Psychiatric: Unassessable.  Musculoskeletal: Unassessable.  Cardiovascular: Distant. Regular rate and rhythm. Massive lower extremity edema is decreasing with some skin wrinkling.  Respiratory: Coarse breath sounds. No wheezes, rales or rhonchi. Appears to be doing adequately on the ventilator.  Abdomen: Obese. There is less abdominal wall edema.  Skin: Chronic lower extremity venous stasis changes without change.  Telemetry: Sinus rhythm  Data Reviewed:  Weight down 1 kg  Excellent urine output 4450  Arterial pH 7.48, PCO2 60.5, PO2 66  Potassium 2.5. Chloride 91.  WBC normal. Hemoglobin stable 10.1.  Chest x-ray: Persistent interstitial edema, bilateral basilar opacities left greater than right.   Scheduled Meds: . ipratropium  0.5 mg Nebulization Q4H   And  . albuterol  2.5 mg Nebulization Q4H  . antiseptic  oral rinse  15 mL Mouth Rinse QID  . aspirin EC  81 mg Oral Daily  . azithromycin  500 mg Intravenous Q24H  . cefTRIAXone (ROCEPHIN)  IV  1 g Intravenous Q24H  . chlorhexidine  15 mL Mouth Rinse BID  . enoxaparin (LOVENOX) injection  100 mg Subcutaneous Q24H  . furosemide  40 mg Intravenous Q8H  . losartan  100 mg Oral Daily  . multivitamin with minerals  1 tablet Oral Daily  . nicotine  21 mg Transdermal Daily  . pantoprazole (PROTONIX) IV  40 mg Intravenous Daily  . potassium chloride  10 mEq Intravenous Q1 Hr x 4  . sodium chloride  10-40 mL Intracatheter Q12H  . sodium chloride  3 mL Intravenous Q12H  . vancomycin  1,500 mg Intravenous Q12H   Continuous Infusions: . sodium chloride 20 mL/hr at 11/22/12 0200  . propofol 30 mcg/kg/min (11/22/12 0640)    Principal Problem:   Acute respiratory failure with hypoxia Active Problems:   Volume overload   Bilateral lower extremity edema   HTN (hypertension)   Normocytic anemia   Tobacco dependence   Acute diastolic congestive heart failure   CAP (community acquired pneumonia)   Sepsis   Time spent 20 minutes

## 2012-11-22 NOTE — Progress Notes (Signed)
Subjective: She is overall about the same. She is doing her wakeup assessment and is awake and responsive and attempting to communicate. No new problems during the night.  Objective: Vital signs in last 24 hours: Temp:  [99 F (37.2 C)-100.4 F (38 C)] 99 F (37.2 C) (11/02 0300) Pulse Rate:  [62-75] 72 (11/02 0700) Resp:  [14-19] 19 (11/02 0700) BP: (95-127)/(41-66) 118/41 mmHg (11/02 0700) SpO2:  [86 %-98 %] 91 % (11/02 0809) FiO2 (%):  [70 %] 70 % (11/02 0810) Weight:  [195 kg (429 lb 14.4 oz)] 195 kg (429 lb 14.4 oz) (11/02 0249) Weight change: -1.09 kg (-2 lb 6.5 oz) Last BM Date: 11/19/12  Intake/Output from previous day: 11/01 0701 - 11/02 0700 In: 2283.7 [I.V.:983.7; IV Piggyback:1300] Out: 4775 [Urine:4450; Emesis/NG output:325]  PHYSICAL EXAM General appearance: alert, morbidly obese and Intubated and on mechanical ventilation. She is undergoing wakeup assessment and is neurologically intact Resp: rhonchi bilaterally Cardio: regular rate and rhythm, S1, S2 normal, no murmur, click, rub or gallop GI: She still has abdominal swelling but less Extremities: She still has edema, chronic venous stasis and leg ulceration but is improving  Lab Results:    Basic Metabolic Panel:  Recent Labs  40/34/74 0506 11/22/12 0451  NA 141 140  K 3.5 2.5*  CL 91* 91*  CO2 45* >45*  GLUCOSE 102* 98  BUN 9 10  CREATININE 0.89 0.89  CALCIUM 9.0 8.9  MG  --  1.8   Liver Function Tests: No results found for this basename: AST, ALT, ALKPHOS, BILITOT, PROT, ALBUMIN,  in the last 72 hours No results found for this basename: LIPASE, AMYLASE,  in the last 72 hours No results found for this basename: AMMONIA,  in the last 72 hours CBC:  Recent Labs  11/19/12 1428 11/20/12 0526 11/22/12 0451  WBC 9.9 11.1* 10.5  NEUTROABS 5.6  --   --   HGB 11.1* 11.0* 10.1*  HCT 40.6 40.7 36.7  MCV 78.8 80.6 77.9*  PLT 313 347 293   Cardiac Enzymes:  Recent Labs  11/19/12 1428   TROPONINI <0.30   BNP:  Recent Labs  11/19/12 1428  PROBNP 3418.0*   D-Dimer: No results found for this basename: DDIMER,  in the last 72 hours CBG: No results found for this basename: GLUCAP,  in the last 72 hours Hemoglobin A1C: No results found for this basename: HGBA1C,  in the last 72 hours Fasting Lipid Panel: No results found for this basename: CHOL, HDL, LDLCALC, TRIG, CHOLHDL, LDLDIRECT,  in the last 72 hours Thyroid Function Tests: No results found for this basename: TSH, T4TOTAL, FREET4, T3FREE, THYROIDAB,  in the last 72 hours Anemia Panel: No results found for this basename: VITAMINB12, FOLATE, FERRITIN, TIBC, IRON, RETICCTPCT,  in the last 72 hours Coagulation:  Recent Labs  11/19/12 1428  LABPROT 14.4  INR 1.14   Urine Drug Screen: Drugs of Abuse  No results found for this basename: labopia, cocainscrnur, labbenz, amphetmu, thcu, labbarb    Alcohol Level: No results found for this basename: ETH,  in the last 72 hours Urinalysis: No results found for this basename: COLORURINE, APPERANCEUR, LABSPEC, PHURINE, GLUCOSEU, HGBUR, BILIRUBINUR, KETONESUR, PROTEINUR, UROBILINOGEN, NITRITE, LEUKOCYTESUR,  in the last 72 hours Misc. Labs:  ABGS  Recent Labs  11/22/12 0445  PHART 7.480*  PO2ART 66.4*  TCO2 40.6  HCO3 44.5*   CULTURES Recent Results (from the past 240 hour(s))  MRSA PCR SCREENING     Status: None  Collection Time    11/20/12  9:29 PM      Result Value Range Status   MRSA by PCR NEGATIVE  NEGATIVE Final   Comment:            The GeneXpert MRSA Assay (FDA     approved for NASAL specimens     only), is one component of a     comprehensive MRSA colonization     surveillance program. It is not     intended to diagnose MRSA     infection nor to guide or     monitor treatment for     MRSA infections.  CULTURE, BLOOD (ROUTINE X 2)     Status: None   Collection Time    11/21/12  9:30 AM      Result Value Range Status   Specimen  Description     Final   Value: BLOOD BLOOD RIGHT HAND BOTTLES DRAWN AEROBIC AND ANAEROBIC   Special Requests 8CC   Final   Culture PENDING   Incomplete   Report Status PENDING   Incomplete  CULTURE, BLOOD (ROUTINE X 2)     Status: None   Collection Time    11/21/12  9:39 AM      Result Value Range Status   Specimen Description     Final   Value: BLOOD BLOOD LEFT HAND BOTTLES DRAWN AEROBIC AND ANAEROBIC   Special Requests [facility]   Final   Culture PENDING   Incomplete   Report Status PENDING   Incomplete   Studies/Results: Dg Chest Port 1 View  11/22/2012   CLINICAL DATA:  Followup chest x-ray. Ventilator protocol.  EXAM: PORTABLE CHEST - 1 VIEW  COMPARISON:  11/21/2012  FINDINGS: Endotracheal tube is in place with tip 2.4 cm above the carinal. Nasogastric tube is in place with tip off the film but beyond the gastroesophageal junction. A left-sided PICC line tip overlies the level of superior vena cava.  The heart is markedly enlarged and stable. Dense opacities are identified at the lung bases, left greater than right. There are changes of interstitial edema, similar in appearance to prior study.  IMPRESSION: 1. Persistent cardiomegaly and interstitial edema. 2. Persistent significant basilar opacities, left greater than right.   Electronically Signed   By: Rosalie Gums M.D.   On: 11/22/2012 07:38   Dg Chest Port 1 View  11/21/2012   CLINICAL DATA:  PICC line placement. Ventilator dependent respiratory failure.  EXAM: PORTABLE CHEST - 1 VIEW  COMPARISON:  11/21/2012  FINDINGS: A new left arm PICC line is seen with tip in the mid SVC. Endotracheal tube and nasogastric tube remain in place.  Symmetric diffuse interstitial and airspace disease with bibasilar predominance shows no significant change. Cardiomegaly is also stable.  IMPRESSION: New left arm PICC line tip in mid SVC. Otherwise no significant change.   Electronically Signed   By: Myles Rosenthal M.D.   On: 11/21/2012 13:41   Portable Chest  Xray  11/21/2012   CLINICAL DATA:  Endotracheal tube placed  EXAM: PORTABLE CHEST - 1 VIEW  COMPARISON:  11/19/2012  FINDINGS: Endotracheal tube ends in the mid to lower thoracic trachea. Enteric tube crosses the diaphragm.  Chronic cardiopericardial enlargement. Worsening lung aeration diffusely with indistinct opacities, somewhat bandlike in the right lower lung. The interstitium is diffusely prominent. No effusion or visible pneumothorax.  IMPRESSION: 1. Good positioning of endotracheal and nasogastric tubes. 2. Worsening aeration since yesterday, which could represent atelectasis, aspiration pneumonitis, or multi  focal pneumonia. 3. Pulmonary venous congestion, possibly positional.   Electronically Signed   By: Tiburcio Pea M.D.   On: 11/21/2012 01:39    Medications:  Prior to Admission:  Prescriptions prior to admission  Medication Sig Dispense Refill  . betamethasone dipropionate (DIPROLENE) 0.05 % cream Apply 1 application topically 2 (two) times daily as needed. rash      . esomeprazole (NEXIUM) 40 MG capsule Take 40 mg by mouth daily before breakfast.      . furosemide (LASIX) 20 MG tablet Take 10 mg by mouth daily.       Marland Kitchen losartan (COZAAR) 100 MG tablet Take 100 mg by mouth daily.      . methocarbamol (ROBAXIN) 500 MG tablet Take 500 mg by mouth daily.        Scheduled: . ipratropium  0.5 mg Nebulization Q4H   And  . albuterol  2.5 mg Nebulization Q4H  . antiseptic oral rinse  15 mL Mouth Rinse QID  . aspirin EC  81 mg Oral Daily  . azithromycin  500 mg Intravenous Q24H  . cefTRIAXone (ROCEPHIN)  IV  1 g Intravenous Q24H  . chlorhexidine  15 mL Mouth Rinse BID  . enoxaparin (LOVENOX) injection  100 mg Subcutaneous Q24H  . furosemide  40 mg Intravenous Q8H  . losartan  100 mg Oral Daily  . multivitamin with minerals  1 tablet Oral Daily  . nicotine  21 mg Transdermal Daily  . pantoprazole (PROTONIX) IV  40 mg Intravenous Daily  . potassium chloride  10 mEq Intravenous Q1 Hr  x 4  . sodium chloride  10-40 mL Intracatheter Q12H  . sodium chloride  3 mL Intravenous Q12H  . vancomycin  1,500 mg Intravenous Q12H   Continuous: . sodium chloride 20 mL/hr at 11/22/12 0200  . feeding supplement (VITAL AF 1.2 CAL)    . propofol 30 mcg/kg/min (11/22/12 0640)   ZOX:WRUEAV chloride, acetaminophen, acetaminophen, fentaNYL, nicotine polacrilex, ondansetron (ZOFRAN) IV, sodium chloride, sodium chloride, sodium chloride, sodium chloride  Assesment: She has acute respiratory failure on a ventilator. This is multi-factorial and there are components of community-acquired pneumonia acute diastolic congestive heart failure obesity hypoventilation and sleep apnea. I believe she has COPD as well. She is hypokalemic this morning that is being replaced. Tube feedings her to start today. She remains on 70% oxygen so there is no opportunity to wean today. Her blood gas is marginal but oxygen saturation is okay. Principal Problem:   Acute respiratory failure with hypoxia Active Problems:   Volume overload   Bilateral lower extremity edema   HTN (hypertension)   Normocytic anemia   Tobacco dependence   Acute diastolic congestive heart failure   CAP (community acquired pneumonia)   Sepsis    Plan: Continue current treatments. I think this is simply going to take time to get fluid removed help get her over her pneumonia and work on her oxygen requirement    LOS: 3 days   Shirlena Brinegar L 11/22/2012, 8:46 AM

## 2012-11-23 ENCOUNTER — Ambulatory Visit (HOSPITAL_COMMUNITY): Payer: BC Managed Care – PPO | Admitting: Physical Therapy

## 2012-11-23 DIAGNOSIS — E876 Hypokalemia: Secondary | ICD-10-CM

## 2012-11-23 DIAGNOSIS — I10 Essential (primary) hypertension: Secondary | ICD-10-CM

## 2012-11-23 LAB — BLOOD GAS, ARTERIAL
Bicarbonate: 42.3 mEq/L — ABNORMAL HIGH (ref 20.0–24.0)
Bicarbonate: 42.6 mEq/L — ABNORMAL HIGH (ref 20.0–24.0)
Drawn by: 27407
MECHVT: 500 mL
MECHVT: 500 mL
O2 Saturation: 94.2 %
PEEP: 5 cmH2O
Patient temperature: 37
RATE: 16 resp/min
pCO2 arterial: 53.7 mmHg — ABNORMAL HIGH (ref 35.0–45.0)
pCO2 arterial: 62 mmHg (ref 35.0–45.0)
pH, Arterial: 7.508 — ABNORMAL HIGH (ref 7.350–7.450)
pH, Arterial: 7.452 — ABNORMAL HIGH (ref 7.350–7.450)

## 2012-11-23 LAB — CBC
HCT: 35.4 % — ABNORMAL LOW (ref 36.0–46.0)
Hemoglobin: 9.8 g/dL — ABNORMAL LOW (ref 12.0–15.0)
MCH: 21.4 pg — ABNORMAL LOW (ref 26.0–34.0)
MCHC: 27.7 g/dL — ABNORMAL LOW (ref 30.0–36.0)
MCV: 77.3 fL — ABNORMAL LOW (ref 78.0–100.0)
RBC: 4.58 MIL/uL (ref 3.87–5.11)

## 2012-11-23 LAB — BASIC METABOLIC PANEL
BUN: 11 mg/dL (ref 6–23)
Chloride: 92 mEq/L — ABNORMAL LOW (ref 96–112)
GFR calc non Af Amer: 67 mL/min — ABNORMAL LOW (ref >90)
Glucose, Bld: 119 mg/dL — ABNORMAL HIGH (ref 70–99)
Potassium: 2.5 mEq/L — CL (ref 3.5–5.1)
Sodium: 144 mEq/L (ref 135–145)

## 2012-11-23 MED ORDER — VANCOMYCIN HCL 10 G IV SOLR
1250.0000 mg | Freq: Two times a day (BID) | INTRAVENOUS | Status: DC
  Administered 2012-11-23 – 2012-11-27 (×8): 1250 mg via INTRAVENOUS
  Filled 2012-11-23 (×8): qty 1250

## 2012-11-23 MED ORDER — VITAL AF 1.2 CAL PO LIQD
1000.0000 mL | ORAL | Status: AC
  Administered 2012-11-23 – 2012-11-26 (×3): 1000 mL
  Filled 2012-11-23 (×8): qty 1000

## 2012-11-23 MED ORDER — POTASSIUM CHLORIDE 10 MEQ/100ML IV SOLN
10.0000 meq | INTRAVENOUS | Status: AC
  Administered 2012-11-23 (×6): 10 meq via INTRAVENOUS
  Filled 2012-11-23 (×6): qty 100

## 2012-11-23 NOTE — Progress Notes (Signed)
ANTIBIOTIC CONSULT NOTE  Pharmacy Consult for Vancomycin  Indication: pneumonia  Allergies  Allergen Reactions  . Augmentin [Amoxicillin-Pot Clavulanate] Other (See Comments)    NOT SURE/ IT'S BEEN AWHILE   Patient Measurements: Height: 5\' 7"  (170.2 cm) Weight: 429 lb 14.4 oz (195 kg) IBW/kg (Calculated) : 61.6  Vital Signs: Temp: 98.4 F (36.9 C) (11/03 0725) Temp src: Oral (11/03 0725) BP: 110/55 mmHg (11/03 1215) Pulse Rate: 72 (11/03 1215) Intake/Output from previous day: 11/02 0701 - 11/03 0700 In: 3865 [I.V.:1423; NG/GT:742; IV Piggyback:1700] Out: 3350 [Urine:3350] Intake/Output from this shift: Total I/O In: 450 [IV Piggyback:450] Out: 750 [Urine:750]  Labs:  Recent Labs  11/21/12 0506 11/22/12 0451 11/23/12 0430  WBC  --  10.5 10.3  HGB  --  10.1* 9.8*  PLT  --  293 276  CREATININE 0.89 0.89 0.95   Estimated Creatinine Clearance: 124.3 ml/min (by C-G formula based on Cr of 0.95).  Recent Labs  11/23/12 1122  VANCOTROUGH 20.7*    Microbiology: Recent Results (from the past 720 hour(s))  MRSA PCR SCREENING     Status: None   Collection Time    11/20/12  9:29 PM      Result Value Range Status   MRSA by PCR NEGATIVE  NEGATIVE Final   Comment:            The GeneXpert MRSA Assay (FDA     approved for NASAL specimens     only), is one component of a     comprehensive MRSA colonization     surveillance program. It is not     intended to diagnose MRSA     infection nor to guide or     monitor treatment for     MRSA infections.  CULTURE, RESPIRATORY (NON-EXPECTORATED)     Status: None   Collection Time    11/21/12  9:27 AM      Result Value Range Status   Specimen Description TRACHEAL ASPIRATE   Final   Special Requests NONE   Final   Gram Stain     Final   Value: ABUNDANT WBC PRESENT, PREDOMINANTLY PMN     RARE SQUAMOUS EPITHELIAL CELLS PRESENT     MODERATE GRAM POSITIVE COCCI IN PAIRS AND CHAINS     Performed at [facility]    Culture     Final   Value: Non-Pathogenic Oropharyngeal-type Flora Isolated.     Performed at [facility]   Report Status PENDING   Incomplete  CULTURE, BLOOD (ROUTINE X 2)     Status: None   Collection Time    11/21/12  9:30 AM      Result Value Range Status   Specimen Description     Final   Value: BLOOD BLOOD RIGHT HAND BOTTLES DRAWN AEROBIC AND ANAEROBIC   Special Requests [facility]   Final   Culture NO GROWTH 2 DAYS   Final   Report Status PENDING   Incomplete  CULTURE, BLOOD (ROUTINE X 2)     Status: None   Collection Time    11/21/12  9:39 AM      Result Value Range Status   Specimen Description     Final   Value: BLOOD BLOOD LEFT HAND BOTTLES DRAWN AEROBIC AND ANAEROBIC   Special Requests 8CC   Final   Culture NO GROWTH 2 DAYS   Final   Report Status PENDING   Incomplete   Medical History: Past Medical History  Diagnosis Date  .  Hypertension   . GERD (gastroesophageal reflux disease)    Medications:  Scheduled:  . ipratropium  0.5 mg Nebulization Q4H   And  . albuterol  2.5 mg Nebulization Q4H  . antiseptic oral rinse  15 mL Mouth Rinse QID  . aspirin  81 mg Oral Daily  . azithromycin  500 mg Intravenous Q24H  . cefTRIAXone (ROCEPHIN)  IV  1 g Intravenous Q24H  . chlorhexidine  15 mL Mouth Rinse BID  . enoxaparin (LOVENOX) injection  100 mg Subcutaneous Q24H  . feeding supplement (VITAL AF 1.2 CAL)  1,000 mL Per Tube Q24H  . furosemide  40 mg Intravenous Q8H  . losartan  100 mg Oral Daily  . multivitamin with minerals  1 tablet Oral Daily  . nicotine  21 mg Transdermal Daily  . pantoprazole (PROTONIX) IV  40 mg Intravenous Daily  . potassium chloride  10 mEq Intravenous Q1 Hr x 6  . sodium chloride  10-40 mL Intracatheter Q12H  . sodium chloride  3 mL Intravenous Q12H   Assessment: 52yo female who is morbidly obese admitted with suspected pna.  Pt has good renal fxn.  Estimated Creatinine Clearance: 124.3 ml/min (by C-G formula based on Cr of  0.95). Trough level is slightly above goal.  Goal of Therapy:  Vancomycin trough level 15-20 mcg/ml  Plan:  Reduce Vancomycin to 1250 mg IV every 12 hours Vancomycin trough level weekly Continue Rocephin 1 GM IV every 24 hr Continue Azithromycin 500 mg IV every 24 hours (change to PO when appropriate) Monitor labs, renal fxn, and cultures  Valrie Hart A 11/23/2012,12:35 PM

## 2012-11-23 NOTE — Consult Note (Signed)
Error

## 2012-11-23 NOTE — Progress Notes (Signed)
Ventilator settings changed following morning ABG; peep was increased to 8;FIO2 decreased to 80 and RR was decreased to 16 in efforts to not over ventilate the patient.

## 2012-11-23 NOTE — Consult Note (Signed)
CARDIOLOGY CONSULT NOTE   Patient ID: Meghan Hoover MRN: [id number] DOB/AGE: [birth year] 53 y.o.  Admit Date: 11/19/2012 Referring Physician: PTH Primary Physician: Kirk Ruths, MD Consulting Cardiologist: Dina Rich MD Primary Cardiologist: [place] Reason for Consultation: Right Heart Failure-Cor Pulmonale  Clinical Summary Meghan Hoover is a 52 y.o.female admitted with acute dyspnea and volume overload on 11/19/2012.  She has a history of hypertension, morbid obesity, normocytic anemia, and ongoing tobacco abuse. She is currently ventilated and sedated. History is obtained form current records. She has had complaints of worsening LEE for over 5 months, but has had extensive edema for the last 6 weeks with associated weeping. This caused a sore from skin splitting on the lower left leg, and she was sent to wound clinic. They transferred her over to [facility] ER. She was started on po lasix by PCP prior to this.. This was not effective, with significant weight gain of at least 40 lbs. She was initially placed on BiPAP with worsening breathing status despite diureses. However, she has worsened hypercarbia and was intubated on 11/21/2012.   On arrival to ER she was normotensive with O2 sats of 92%. She was found to be hypokalemic with K+ of 3.1, CO2 of 40. Mildy anemic with Hgb of 11.1. Pro-BNP 3418.0. Initial CXR demonstrated cardiomegaly, but could not adequately identify CHF or pneumonia due to body habitus. She was treated with lasix IV.  Echocardiogram was completed on Nov 20, 2012 demonstrating severe RV dilation, with grade 2 diastolic dysfunction, and pulmonary hypertension, with PAP 50 mmHg. She has normal EF of 65%-70%.    She remains on IV lasix, Q 8 hours and has diuresed. Review of I/O records documents diureses between 3,000-6,000 cc per day, with overnight total of 5, 670. She is also being treated for pneumonia with vancomycin, rocephin, and azithromycin. We are asked for  cardiology recommendations for Cor Pulmonale.          Allergies  Allergen Reactions  . Augmentin [Amoxicillin-Pot Clavulanate] Other (See Comments)    NOT SURE/ IT'S BEEN AWHILE    Medications Scheduled Medications: . ipratropium  0.5 mg Nebulization Q4H   And  . albuterol  2.5 mg Nebulization Q4H  . antiseptic oral rinse  15 mL Mouth Rinse QID  . aspirin  81 mg Oral Daily  . azithromycin  500 mg Intravenous Q24H  . cefTRIAXone (ROCEPHIN)  IV  1 g Intravenous Q24H  . chlorhexidine  15 mL Mouth Rinse BID  . enoxaparin (LOVENOX) injection  100 mg Subcutaneous Q24H  . furosemide  40 mg Intravenous Q8H  . losartan  100 mg Oral Daily  . multivitamin with minerals  1 tablet Oral Daily  . nicotine  21 mg Transdermal Daily  . pantoprazole (PROTONIX) IV  40 mg Intravenous Daily  . potassium chloride  10 mEq Intravenous Q1 Hr x 6  . sodium chloride  10-40 mL Intracatheter Q12H  . sodium chloride  3 mL Intravenous Q12H  . vancomycin  1,500 mg Intravenous Q12H     Infusions: . sodium chloride 20 mL/hr at 11/23/12 0600  . feeding supplement (VITAL AF 1.2 CAL) 1,000 mL (11/23/12 0600)  . propofol 33.996 mcg/kg/min (11/23/12 0850)     PRN Medications:  sodium chloride, acetaminophen, acetaminophen, fentaNYL, nicotine polacrilex, ondansetron (ZOFRAN) IV, sodium chloride, sodium chloride, sodium chloride, sodium chloride   Past Medical History  Diagnosis Date  . Hypertension   . GERD (gastroesophageal reflux disease)     Past Surgical History  Procedure Laterality Date  . Achilles tendon surgery    . Back surgery      Family History  Problem Relation Age of Onset  . Diabetes Sister     Social History Ms. Garmany reports that she has been smoking 53 Cigarettes.  She has been smoking about 2.00 packs per day. She does not have any smokeless tobacco history on file. Ms. Hehr reports that she does not drink alcohol.  Review of Systems Otherwise reviewed and negative  except as outlined.  Physical Examination Blood pressure 108/53, pulse 75, temperature 98.4 F (36.9 C), temperature source Oral, resp. rate 16, height 5\' 7"  (1.702 m), weight 429 lb 14.4 oz (195 kg), SpO2 95.00%.  Intake/Output Summary (Last 24 hours) at 11/23/12 1042 Last data filed at 11/23/12 0600  Gross per 24 hour  Intake 3672.35 ml  Output   1350 ml  Net 2322.35 ml    Telemetry: NSR, rates in the 70's.  Intubated and Sedated: HEENT: Conjunctiva and lids normal, oropharynx clear with moist mucosa. Neck: Supple, no elevated JVP or carotid bruits, no thyromegaly. Lungs: Inspiratory wheezes, diminished in the bases, intubated. Cardiac: Regular rate and rhythm, no S3 or significant systolic murmur, no pericardial rub. Abdomen: Soft, nontender, no hepatomegaly, bowel sounds present, no guarding or rebound. Extremities: No pitting edema, distal pulses 2+. Skin: Warm and dry. Musculoskeletal: No kyphosis. 1+ edema in the LE with dependent edema, 2+ posteriorly. Neuropsychiatric: Sedated  Prior Cardiac Testing/Procedures 1. Echocardiogram 11/20/2012 Left ventricle: The cavity size was normal. There was mild concentric hypertrophy. Systolic function was vigorous. The estimated ejection fraction was in the range of 65% to 70%. Images were inadequate for LV wall motion assessment. Features are consistent with a pseudonormal left ventricular filling pattern, with concomitant abnormal relaxation and increased filling pressure (grade 2 diastolic dysfunction). - Ventricular septum: The contour showed diastolic flattening and systolic flattening consistent with increased RV volume and pressure. - Aortic valve: Poorly visualized. Mildly calcified leaflets. No significant regurgitation. - Mitral valve: Mildly thickened leaflets . Trivial regurgitation. - Left atrium: The atrium was mildly dilated. - Right ventricle: The cavity size was severely dilated. Systolic function was  reduced. - Right atrium: The atrium was moderately dilated. - Tricuspid valve: Mild regurgitation. - Pulmonary arteries: Systolic pressure was moderately increased. PA peak pressure: 50mm Hg (S). - Pericardium, extracardiac: There was no pericardial effusion.  No prior study for comparison. Images are quite limited. There is mild LVH with LVEF approximately 65-70%. Grade 2 diastolic dysfunction with increased filling pressures noted. Mild left atrial enlargement. Mildly thickened mitral valve with trivial mitral regurgitation. Aortic valve is mildly calcified and poorly seen overall. There is severe RV dilatation with at least mildly reduced function, somewhat difficult to assess. LV septal flattening is consistent with increased RV volume and pressure. Probable mild tricuspid regurgitation, PASP 50 mmHg with increased CVP. Consistent with moderate pulmonary hypertension. With clinical evidence of edema and volume overload, study would be consistent with components of diastolic heart failure as well as cor pulmonale.   Lab Results  Basic Metabolic Panel:  Recent Labs Lab 11/19/12 1428 11/20/12 0526 11/21/12 0506 11/22/12 0451 11/23/12 0430  NA 141 143 141 140 144  K 3.1* 3.4* 3.5 2.5* 2.5*  CL 93* 95* 91* 91* 92*  CO2 40* 42* 45* >45* 43*  GLUCOSE 112* 132* 102* 98 119*  BUN 14 11 9 10 11   CREATININE 1.03 0.80 0.89 0.89 0.95  CALCIUM 9.3 9.2 9.0 8.9 8.6  MG  --   --   --  1.8 1.8    Liver Function Tests:  CBC:  Recent Labs Lab 11/19/12 1428 11/20/12 0526 11/22/12 0451 11/23/12 0430  WBC 9.9 11.1* 10.5 10.3  NEUTROABS 5.6  --   --   --   HGB 11.1* 11.0* 10.1* 9.8*  HCT 40.6 40.7 36.7 35.4*  MCV 78.8 80.6 77.9* 77.3*  PLT 313 347 293 276    Cardiac Enzymes:  Recent Labs Lab 11/19/12 1428  TROPONINI <0.30    BNP:    Radiology: Dg Chest Port 1 View  11/22/2012   CLINICAL DATA:  Followup chest x-ray. Ventilator protocol.  EXAM: PORTABLE CHEST  - 1 VIEW  COMPARISON:  11/21/2012  FINDINGS: Endotracheal tube is in place with tip 2.4 cm above the carinal. Nasogastric tube is in place with tip off the film but beyond the gastroesophageal junction. A left-sided PICC line tip overlies the level of superior vena cava.  The heart is markedly enlarged and stable. Dense opacities are identified at the lung bases, left greater than right. There are changes of interstitial edema, similar in appearance to prior study.  IMPRESSION: 1. Persistent cardiomegaly and interstitial edema. 2. Persistent significant basilar opacities, left greater than right.   Electronically Signed   By: Rosalie Gums M.D.   On: 11/22/2012 07:38   Dg Chest Port 1 View  11/21/2012   CLINICAL DATA:  PICC line placement. Ventilator dependent respiratory failure.  EXAM: PORTABLE CHEST - 1 VIEW  COMPARISON:  11/21/2012  FINDINGS: A new left arm PICC line is seen with tip in the mid SVC. Endotracheal tube and nasogastric tube remain in place.  Symmetric diffuse interstitial and airspace disease with bibasilar predominance shows no significant change. Cardiomegaly is also stable.  IMPRESSION: New left arm PICC line tip in mid SVC. Otherwise no significant change.   Electronically Signed   By: Myles Rosenthal M.D.   On: 11/21/2012 13:41     ECG: MR wotj BVC's T-wave flattening noted in the lateral leads, uncertain in the inferior leads due to artifact.    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 Likely related to hypoventilation syndrome in the setting of morbid obesity. She has never been tested for OSA. No prior history of PE. She is diuresing very well, with IV lasix with reduction in edema compared to previous documentation.  She has become hypokalemic with potassium of 2.5 this am and is now being repeated,. Wt loss 3 lbs from highest recorded. Concerns for accuracy. Continue IV diureses. ARB.   2. Hypertension with  complications of Diastolic Dysfunction: BP is low normal currently. Now on losartan via OG tube.   3. VDRF: Hypercarbic respiratory failure. Followed by Dr. Juanetta Gosling, with diagnosis of COPD.   4. Ongoing long term tobacco abuse: Contributing to overall lung status and COPD  5, Morbid obesity: Despite fluid retention and CHF, she has BMI greater than 50.         Signed: Bettey Mare. Lyman Bishop NP [facility] 11/23/2012, 10:42 AM Co-Sign MD Patient seen and discussed with NP Lyman Bishop. Patient admitted with several month history of progressing SOB, DOE, LE edema, weight gain and hypoxia. Developed hypoxic and hypercapneic respiratory failure requiring intubation this admission. He echo shows normal LV systolic function, LV diastolic dysfunction with elevated left atrial pressures, and RV pressure/volume overload. RV is poorly visualized, it is significantly dilated but qualitatively the function is not severely decreased (RV TAPSE 2.0 cm, tissue anular velocity 14 cm/s),  and she has evidence of moderately elevated pulmonary artery systolic pressure at 50 mmHg. The cause of her right sided dysfunction is likely multifactorial including left sided diastolic dysfunction, and likely multiple underlying pulmonary etiologies including OSA, obesity hypovent, and COPD. She is responding well to IV diuresis, and appears to still be significantly volume overloaded. Recommend continued IV diuresis for, once she is more euvolemic, her infection is treated, and weaned from vent at some point would recommend a RHC to evaluate her pulmonary HTN further, its unclear how much may be due to left sided heart disease vs a primary pulmonary process, a RHC which she is stable would help sort this out.    Dina Rich MD

## 2012-11-23 NOTE — Progress Notes (Signed)
Marland Kitchen NUTRITION FOLLOW UP  Intervention:   Decrease Vital AF to 20 ml/hr. This will provide 576 kcals, 36 grams protein, and 389 ml fluid daily at rate. With propofol at current rate, calories provided will be 1632 kcals and 36 grams of protein, which meets 103% of estimated calorie needs and 23% of estimated protein needs.  Adjust TF with rate of propofol.  Nutrition Dx:   New nutrition dx: Inadequate oral food intake r/t inability to eat AEB ventilator support; ongoing  Goal:   Enteral nutrition to provide 60-70% of estimated calorie needs (22-25 kcals/kg ideal body weight) and 100% of estimated protein needs, based on ASPEN guidelines for permissive underfeeding in critically ill obese individuals. (Goal for pt are current body weight are 1400-1590 kcals daily and 159-191 grams protein daily per ASPEN underfeeding guidelines)  Monitor:   TF initiation, advancement, and tolerance, skin assessments, labs, weight changes, changes in status  Assessment:   Pt remain on the ventilator. Per MD notes, unable to wean due to high O2 requirements. Pt is diuresing well; noted a 3# wt loss since admission.  Patient is currently intubated on ventilator support.  MV: 8 L/min Temp:Temp (24hrs), Avg:99.7 F (37.6 C), Min:98.4 F (36.9 C), Max:100.5 F (38.1 C)  Propofol: 40 ml/hr (providing 1056 kcals per day)  Enteral feedings of Vital AF @ 40 ml/hr were started on 11/22/12. Feedings provide 1152 kcals, 72 grams protein, and 779 ml fluid daily at current rate. With propofol, pt is receiving 2208 kcals daily, which is 139% of estimated pt calorie needs.   Height: Ht Readings from Last 1 Encounters:  11/21/12 5\' 7"  (1.702 m)    Weight Status:   Wt Readings from Last 1 Encounters:  11/22/12 429 lb 14.4 oz (195 kg)    Re-estimated needs:  Kcal: 286.5 kcals daily Protein: 195-293 grams protein daily Fluid: 2.9-2.9 L daily  Skin: diabetic ulcer on lt leg; weeping edema on rt and lt legs  Diet  Order: NPO   Intake/Output Summary (Last 24 hours) at 11/23/12 1136 Last data filed at 11/23/12 1117  Gross per 24 hour  Intake 4037.05 ml  Output   2100 ml  Net 1937.05 ml    Last BM: 11/19/12   Labs:   Recent Labs Lab 11/21/12 0506 11/22/12 0451 11/23/12 0430  NA 141 140 144  K 3.5 2.5* 2.5*  CL 91* 91* 92*  CO2 45* >45* 43*  BUN 9 10 11   CREATININE 0.89 0.89 0.95  CALCIUM 9.0 8.9 8.6  MG  --  1.8 1.8  GLUCOSE 102* 98 119*    CBG (last 3)  No results found for this basename: GLUCAP,  in the last 72 hours  Scheduled Meds: . ipratropium  0.5 mg Nebulization Q4H   And  . albuterol  2.5 mg Nebulization Q4H  . antiseptic oral rinse  15 mL Mouth Rinse QID  . aspirin  81 mg Oral Daily  . azithromycin  500 mg Intravenous Q24H  . cefTRIAXone (ROCEPHIN)  IV  1 g Intravenous Q24H  . chlorhexidine  15 mL Mouth Rinse BID  . enoxaparin (LOVENOX) injection  100 mg Subcutaneous Q24H  . furosemide  40 mg Intravenous Q8H  . losartan  100 mg Oral Daily  . multivitamin with minerals  1 tablet Oral Daily  . nicotine  21 mg Transdermal Daily  . pantoprazole (PROTONIX) IV  40 mg Intravenous Daily  . potassium chloride  10 mEq Intravenous Q1 Hr x 6  .  sodium chloride  10-40 mL Intracatheter Q12H  . sodium chloride  3 mL Intravenous Q12H  . vancomycin  1,500 mg Intravenous Q12H    Continuous Infusions: . sodium chloride 20 mL/hr at 11/23/12 0600  . feeding supplement (VITAL AF 1.2 CAL) 1,000 mL (11/23/12 0600)  . propofol 34.846 mcg/kg/min (11/23/12 1105)    Gerldine Suleiman A. Mayford Knife, RD, LDN Pager: [phone]

## 2012-11-23 NOTE — Progress Notes (Signed)
Patient's BP was 91/36. MD notified in person, and he said to hold her lasix until her BP improved.

## 2012-11-23 NOTE — Progress Notes (Signed)
[facility] PROGRESS NOTE  Meghan Hoover EAV:[id number] DOB: Jun 20, 1960 DOA: 11/19/2012 PCP: Kirk Ruths, MD  Assessment/Plan: 1. VDRF, acute respiratory acidosis: PH now alkalotic, ventilatory settings adjusted per pulmonology, management per pulmonology. Multifactorial including community-acquired pneumonia, acute diastolic congestive heart failure, suspected obesity hypoventilation syndrome, suspected obstructive sleep apnea, suspected COPD without evidence of acute exacerbation. 2. Community acquired pneumonia, possible developing sepsis: No significant change. Afebrile, hemodynamically stable. Lactic acid was normal. Followup culture data. Continue empiric antibiotics. 3. Acute diastolic congestive heart failure: Slowly improving. Complicated by pulmonary hypertension, suspected cor pulmonale, long-term smoking. Excellent urine output. Creatinine preserved. Continue diuretics. 4. Anemia of critical illness: Overall appears stable. Transfuse for hemoglobin less than 7. 5. Hypokalemia: Replete. 6. Hypertension: Stable. 7. Morbid obesity 8. Tobacco dependence: Patient has no desire to quit.  Review of laboratory data, current diagnoses and treatment plan with husband at bedside. Patient remains critically ill. Overall there has been little change, continues to have high oxygen requirement, unable to wean at this time. She is making some improvement with diuresis. We will continue current management.   Continue empiric antibiotics, followup culture data  No attempt to wean today  Continue diuretic therapy, consult cardiology for assistance with heart failure  Replace potassium  Pending studies:   Sputum culture  Blood cultures  Code Status: full code DVT prophylaxis: Lovenox Family Communication:  Disposition Plan: home when improved  Brendia Sacks, MD  [facility]  Pager [phone] If 7PM-7AM, please contact night-coverage at [email or web address], password  [facility] 11/23/2012, 9:03 AM  LOS: 4 days   Summary: 52 year old woman presented to the emergency department with increasing shortness of breath. Initial evaluation was notable for hypoxic respiratory failure, massive volume overload from feet up to abdomen suggesting CHF.  Consultants:  Pulmonology  Procedures:  2-D echocardiogram: LVEF 65-70%; Grade 2 diastolic dysfunction. Moderate pulmonary HTN, possible cor pulmonale.  11/1: PICC line placement  HPI/Subjective: No new issues overnight. On 80% FiO2. Tube feeds were started yesterday.  Objective: Filed Vitals:   11/23/12 0700 11/23/12 0714 11/23/12 0725 11/23/12 0800  BP: 124/59   113/54  Pulse: 74   78  Temp:   98.4 F (36.9 C)   TempSrc:   Oral   Resp: 16   14  Height:      Weight:      SpO2: 94% 94%  94%    Intake/Output Summary (Last 24 hours) at 11/23/12 0903 Last data filed at 11/23/12 0600  Gross per 24 hour  Intake 3697.33 ml  Output   1350 ml  Net 2347.33 ml     Filed Weights   11/19/12 1821 11/20/12 0717 11/22/12 0249  Weight: 196.271 kg (432 lb 11.2 oz) 196.09 kg (432 lb 4.8 oz) 195 kg (429 lb 14.4 oz)    Exam:   Afebrile, vital signs are stable. FiO2 70%.  General: Sedated, intubated.  Eyes: Pupils, irises, lids appear unremarkable.  ENT: Appears grossly normal.  Cardiovascular: Regular rate and rhythm. No murmur, rub or gallop. Perhaps modest improvement in 3+ bilateral lower extremity edema.  Telemetry: Sinus rhythm.  Respiratory: Somewhat improved air movement. No frank wheezes, rales or rhonchi. Breath sounds remain diminished. No air trapping on ventilator waveform. Appears to be tolerating the ventilator well.  Abdomen: Obese. Soft. Decreased abdominal wall edema.  Musculoskeletal: Spontaneous movement seen.  Neurologic: Cannot assess.  Psychiatric: Cannot assess.  Data Reviewed:  Excellent urine output, 3350  ABG noted pH 7.5, PCO2 has decreased to 53.7, PO2 improved to  70.8. Potassium again 2.5.   CO2 has decreased to 43  Hemoglobin stable 9.8.   Scheduled Meds: . ipratropium  0.5 mg Nebulization Q4H   And  . albuterol  2.5 mg Nebulization Q4H  . antiseptic oral rinse  15 mL Mouth Rinse QID  . aspirin  81 mg Oral Daily  . azithromycin  500 mg Intravenous Q24H  . cefTRIAXone (ROCEPHIN)  IV  1 g Intravenous Q24H  . chlorhexidine  15 mL Mouth Rinse BID  . enoxaparin (LOVENOX) injection  100 mg Subcutaneous Q24H  . furosemide  40 mg Intravenous Q8H  . losartan  100 mg Oral Daily  . multivitamin with minerals  1 tablet Oral Daily  . nicotine  21 mg Transdermal Daily  . pantoprazole (PROTONIX) IV  40 mg Intravenous Daily  . potassium chloride  10 mEq Intravenous Q1 Hr x 6  . sodium chloride  10-40 mL Intracatheter Q12H  . sodium chloride  3 mL Intravenous Q12H  . vancomycin  1,500 mg Intravenous Q12H   Continuous Infusions: . sodium chloride 20 mL/hr at 11/23/12 0600  . feeding supplement (VITAL AF 1.2 CAL) 1,000 mL (11/23/12 0600)  . propofol 33.996 mcg/kg/min (11/23/12 0850)    Principal Problem:   Acute respiratory failure with hypoxia Active Problems:   Volume overload   Bilateral lower extremity edema   HTN (hypertension)   Normocytic anemia   Tobacco dependence   Acute diastolic congestive heart failure   CAP (community acquired pneumonia)   Sepsis   Time spent 20 minutes

## 2012-11-23 NOTE — Progress Notes (Signed)
UR chart review completed.  

## 2012-11-23 NOTE — Progress Notes (Signed)
K 2.5 and CO2 43 Called at 0616. MD sent text page @ [phone]

## 2012-11-23 NOTE — Progress Notes (Signed)
Subjective: She remains intubated and on the ventilator. She does respond. Changes were made in her ventilator settings because of her pH is 7.5. Repeat blood gas is pending. Chest x-ray still showed multifocal pneumonia and evidence of CHF yesterday and today's chest x-ray is pending  Objective: Vital signs in last 24 hours: Temp:  [98.2 F (36.8 C)-100.5 F (38.1 C)] 98.4 F (36.9 C) (11/03 0725) Pulse Rate:  [69-87] 73 (11/03 0645) Resp:  [14-21] 16 (11/03 0645) BP: (82-159)/(35-142) 117/54 mmHg (11/03 0645) SpO2:  [80 %-98 %] 94 % (11/03 0714) FiO2 (%):  [70 %-85 %] 80 % (11/03 0714) Weight change:  Last BM Date: 11/19/12  Intake/Output from previous day: 11/02 0701 - 11/03 0700 In: 3865 [I.V.:1423; NG/GT:742; IV Piggyback:1700] Out: 3350 [Urine:3350]  PHYSICAL EXAM General appearance: morbidly obese and Intubated sedated but responsive Resp: rhonchi bilaterally Cardio: Her heart is regular with markedly diminished heart sounds GI: soft, non-tender; bowel sounds normal; no masses,  no organomegaly Extremities: She still has edema and chronic venous stasis and a leg ulcer  Lab Results:    Basic Metabolic Panel:  Recent Labs  16/10/96 0451 11/23/12 0430  NA 140 144  K 2.5* 2.5*  CL 91* 92*  CO2 >45* 43*  GLUCOSE 98 119*  BUN 10 11  CREATININE 0.89 0.95  CALCIUM 8.9 8.6  MG 1.8 1.8   Liver Function Tests: No results found for this basename: AST, ALT, ALKPHOS, BILITOT, PROT, ALBUMIN,  in the last 72 hours No results found for this basename: LIPASE, AMYLASE,  in the last 72 hours No results found for this basename: AMMONIA,  in the last 72 hours CBC:  Recent Labs  11/22/12 0451 11/23/12 0430  WBC 10.5 10.3  HGB 10.1* 9.8*  HCT 36.7 35.4*  MCV 77.9* 77.3*  PLT 293 276   Cardiac Enzymes: No results found for this basename: CKTOTAL, CKMB, CKMBINDEX, TROPONINI,  in the last 72 hours BNP: No results found for this basename: PROBNP,  in the last 72  hours D-Dimer: No results found for this basename: DDIMER,  in the last 72 hours CBG: No results found for this basename: GLUCAP,  in the last 72 hours Hemoglobin A1C: No results found for this basename: HGBA1C,  in the last 72 hours Fasting Lipid Panel: No results found for this basename: CHOL, HDL, LDLCALC, TRIG, CHOLHDL, LDLDIRECT,  in the last 72 hours Thyroid Function Tests: No results found for this basename: TSH, T4TOTAL, FREET4, T3FREE, THYROIDAB,  in the last 72 hours Anemia Panel: No results found for this basename: VITAMINB12, FOLATE, FERRITIN, TIBC, IRON, RETICCTPCT,  in the last 72 hours Coagulation: No results found for this basename: LABPROT, INR,  in the last 72 hours Urine Drug Screen: Drugs of Abuse  No results found for this basename: labopia, cocainscrnur, labbenz, amphetmu, thcu, labbarb    Alcohol Level: No results found for this basename: ETH,  in the last 72 hours Urinalysis: No results found for this basename: COLORURINE, APPERANCEUR, LABSPEC, PHURINE, GLUCOSEU, HGBUR, BILIRUBINUR, KETONESUR, PROTEINUR, UROBILINOGEN, NITRITE, LEUKOCYTESUR,  in the last 72 hours Misc. Labs:  ABGS  Recent Labs  11/23/12 0510  PHART 7.508*  PO2ART 70.8*  TCO2 38.7  HCO3 42.3*   CULTURES Recent Results (from the past 240 hour(s))  MRSA PCR SCREENING     Status: None   Collection Time    11/20/12  9:29 PM      Result Value Range Status   MRSA by PCR NEGATIVE  NEGATIVE  Final   Comment:            The GeneXpert MRSA Assay (FDA     approved for NASAL specimens     only), is one component of a     comprehensive MRSA colonization     surveillance program. It is not     intended to diagnose MRSA     infection nor to guide or     monitor treatment for     MRSA infections.  CULTURE, RESPIRATORY (NON-EXPECTORATED)     Status: None   Collection Time    11/21/12  9:27 AM      Result Value Range Status   Specimen Description TRACHEAL ASPIRATE   Final   Special Requests  NONE   Final   Gram Stain     Final   Value: ABUNDANT WBC PRESENT, PREDOMINANTLY PMN     RARE SQUAMOUS EPITHELIAL CELLS PRESENT     MODERATE GRAM POSITIVE COCCI IN PAIRS AND CHAINS     Performed at [facility]   Culture     Final   Value: Culture reincubated for better growth     Performed at [facility]   Report Status PENDING   Incomplete  CULTURE, BLOOD (ROUTINE X 2)     Status: None   Collection Time    11/21/12  9:30 AM      Result Value Range Status   Specimen Description     Final   Value: BLOOD BLOOD RIGHT HAND BOTTLES DRAWN AEROBIC AND ANAEROBIC   Special Requests [facility]   Final   Culture PENDING   Incomplete   Report Status PENDING   Incomplete  CULTURE, BLOOD (ROUTINE X 2)     Status: None   Collection Time    11/21/12  9:39 AM      Result Value Range Status   Specimen Description     Final   Value: BLOOD BLOOD LEFT HAND BOTTLES DRAWN AEROBIC AND ANAEROBIC   Special Requests [facility]   Final   Culture PENDING   Incomplete   Report Status PENDING   Incomplete   Studies/Results: Dg Chest Port 1 View  11/22/2012   CLINICAL DATA:  Followup chest x-ray. Ventilator protocol.  EXAM: PORTABLE CHEST - 1 VIEW  COMPARISON:  11/21/2012  FINDINGS: Endotracheal tube is in place with tip 2.4 cm above the carinal. Nasogastric tube is in place with tip off the film but beyond the gastroesophageal junction. A left-sided PICC line tip overlies the level of superior vena cava.  The heart is markedly enlarged and stable. Dense opacities are identified at the lung bases, left greater than right. There are changes of interstitial edema, similar in appearance to prior study.  IMPRESSION: 1. Persistent cardiomegaly and interstitial edema. 2. Persistent significant basilar opacities, left greater than right.   Electronically Signed   By: Rosalie Gums M.D.   On: 11/22/2012 07:38   Dg Chest Port 1 View  11/21/2012   CLINICAL DATA:  PICC line placement. Ventilator dependent respiratory  failure.  EXAM: PORTABLE CHEST - 1 VIEW  COMPARISON:  11/21/2012  FINDINGS: A new left arm PICC line is seen with tip in the mid SVC. Endotracheal tube and nasogastric tube remain in place.  Symmetric diffuse interstitial and airspace disease with bibasilar predominance shows no significant change. Cardiomegaly is also stable.  IMPRESSION: New left arm PICC line tip in mid SVC. Otherwise no significant change.   Electronically Signed   By: Jonny Ruiz  Eppie Gibson M.D.   On: 11/21/2012 13:41    Medications:  Scheduled: . ipratropium  0.5 mg Nebulization Q4H   And  . albuterol  2.5 mg Nebulization Q4H  . antiseptic oral rinse  15 mL Mouth Rinse QID  . aspirin  81 mg Oral Daily  . azithromycin  500 mg Intravenous Q24H  . cefTRIAXone (ROCEPHIN)  IV  1 g Intravenous Q24H  . chlorhexidine  15 mL Mouth Rinse BID  . enoxaparin (LOVENOX) injection  100 mg Subcutaneous Q24H  . furosemide  40 mg Intravenous Q8H  . losartan  100 mg Oral Daily  . multivitamin with minerals  1 tablet Oral Daily  . nicotine  21 mg Transdermal Daily  . pantoprazole (PROTONIX) IV  40 mg Intravenous Daily  . potassium chloride  10 mEq Intravenous Q1 Hr x 6  . sodium chloride  10-40 mL Intracatheter Q12H  . sodium chloride  3 mL Intravenous Q12H  . vancomycin  1,500 mg Intravenous Q12H   Continuous: . sodium chloride 20 mL/hr at 11/23/12 0600  . feeding supplement (VITAL AF 1.2 CAL) 1,000 mL (11/23/12 0600)  . propofol 32 mcg/kg/min (11/23/12 0600)   ZOX:WRUEAV chloride, acetaminophen, acetaminophen, fentaNYL, nicotine polacrilex, ondansetron (ZOFRAN) IV, sodium chloride, sodium chloride, sodium chloride, sodium chloride  Assesment: She has acute respiratory failure. She remains intubated on the ventilator and is requiring 80% oxygen at this point but will have another blood gas since her ventilator has been changed. She is morbidly obese which of course complicates her situation I think she has sleep apnea and probably obesity  hypoventilation. She has community-acquired pneumonia which is multi-focal. I think she has COPD as well although she's not had that diagnosis made in the past. She has acute diastolic heart failure and based on her echocardiogram probably has cor pulmonale Principal Problem:   Acute respiratory failure with hypoxia Active Problems:   Volume overload   Bilateral lower extremity edema   HTN (hypertension)   Normocytic anemia   Tobacco dependence   Acute diastolic congestive heart failure   CAP (community acquired pneumonia)   Sepsis    Plan: Continue antibiotics. Continue efforts at diuresis. Recheck blood gases.    LOS: 4 days   Karly Pitter L 11/23/2012, 7:57 AM

## 2012-11-24 ENCOUNTER — Inpatient Hospital Stay (HOSPITAL_COMMUNITY): Payer: BC Managed Care – PPO

## 2012-11-24 DIAGNOSIS — I517 Cardiomegaly: Secondary | ICD-10-CM

## 2012-11-24 LAB — BLOOD GAS, ARTERIAL
Acid-Base Excess: 16.2 mmol/L — ABNORMAL HIGH (ref 0.0–2.0)
Bicarbonate: 41.8 mEq/L — ABNORMAL HIGH (ref 20.0–24.0)
FIO2: 80 %
O2 Saturation: 91.9 %
Patient temperature: 37
pO2, Arterial: 70.1 mmHg — ABNORMAL LOW (ref 80.0–100.0)

## 2012-11-24 LAB — HEPATIC FUNCTION PANEL
ALT: 11 U/L (ref 0–35)
Alkaline Phosphatase: 68 U/L (ref 39–117)
Bilirubin, Direct: 0.3 mg/dL (ref 0.0–0.3)
Indirect Bilirubin: 0.3 mg/dL (ref 0.3–0.9)
Total Bilirubin: 0.6 mg/dL (ref 0.3–1.2)
Total Protein: 6.6 g/dL (ref 6.0–8.3)

## 2012-11-24 LAB — BASIC METABOLIC PANEL
BUN: 12 mg/dL (ref 6–23)
CO2: 43 mEq/L (ref 19–32)
Calcium: 9 mg/dL (ref 8.4–10.5)
Chloride: 92 mEq/L — ABNORMAL LOW (ref 96–112)
Creatinine, Ser: 0.92 mg/dL (ref 0.50–1.10)
GFR calc Af Amer: 81 mL/min — ABNORMAL LOW (ref >90)
GFR calc non Af Amer: 70 mL/min — ABNORMAL LOW (ref >90)
Glucose, Bld: 111 mg/dL — ABNORMAL HIGH (ref 70–99)

## 2012-11-24 LAB — CULTURE, RESPIRATORY W GRAM STAIN

## 2012-11-24 LAB — TRIGLYCERIDES: Triglycerides: 129 mg/dL (ref <150)

## 2012-11-24 LAB — CULTURE, RESPIRATORY

## 2012-11-24 MED ORDER — FUROSEMIDE 10 MG/ML IJ SOLN
60.0000 mg | Freq: Three times a day (TID) | INTRAMUSCULAR | Status: DC
  Administered 2012-11-24 – 2012-11-26 (×6): 60 mg via INTRAVENOUS
  Filled 2012-11-24 (×6): qty 6

## 2012-11-24 MED ORDER — SODIUM CHLORIDE 0.9 % IV SOLN
50.0000 ug/h | INTRAVENOUS | Status: DC
  Administered 2012-11-24 – 2012-11-26 (×3): 30 ug/h via INTRAVENOUS
  Administered 2012-11-28: 150 ug/h via INTRAVENOUS
  Administered 2012-11-30: 30 ug/h via INTRAVENOUS
  Administered 2012-12-02 – 2012-12-03 (×2): 350 ug/h via INTRAVENOUS
  Administered 2012-12-03: 300 ug/h via INTRAVENOUS
  Administered 2012-12-04 – 2012-12-05 (×2): 150 ug/h via INTRAVENOUS
  Administered 2012-12-06 (×2): 125 ug/h via INTRAVENOUS
  Filled 2012-11-24 (×16): qty 50

## 2012-11-24 MED ORDER — METHYLPREDNISOLONE SODIUM SUCC 40 MG IJ SOLR
40.0000 mg | Freq: Two times a day (BID) | INTRAMUSCULAR | Status: DC
  Administered 2012-11-24 – 2012-12-02 (×17): 40 mg via INTRAVENOUS
  Filled 2012-11-24 (×19): qty 1

## 2012-11-24 MED ORDER — POTASSIUM CHLORIDE 10 MEQ/100ML IV SOLN
10.0000 meq | INTRAVENOUS | Status: DC
  Administered 2012-11-24 (×2): 10 meq via INTRAVENOUS
  Filled 2012-11-24 (×7): qty 100

## 2012-11-24 MED ORDER — POTASSIUM CHLORIDE 20 MEQ/15ML (10%) PO LIQD
40.0000 meq | Freq: Three times a day (TID) | ORAL | Status: DC
  Administered 2012-11-24 – 2012-12-01 (×21): 40 meq
  Filled 2012-11-24 (×23): qty 30

## 2012-11-24 MED ORDER — LOSARTAN POTASSIUM 50 MG PO TABS: 50.0000 mg | ORAL_TABLET | Freq: Every day | ORAL | Status: DC

## 2012-11-24 NOTE — Progress Notes (Signed)
*  PRELIMINARY RESULTS* Echocardiogram 2D Echocardiogram limited has been performed.  Meghan Hoover 11/24/2012, 1:26 PM

## 2012-11-24 NOTE — Progress Notes (Signed)
Consulting cardiologist: Wyline Mood  Subjective:    Intubated and sedated. Required Neo-Synephrine.  Objective:   Temp:  [99.1 F (37.3 C)-99.8 F (37.7 C)] 99.5 F (37.5 C) (11/04 0739) Pulse Rate:  [70-81] 74 (11/04 0808) Resp:  [14-18] 16 (11/04 0808) BP: (80-129)/(34-66) 129/58 mmHg (11/04 0730) SpO2:  [91 %-99 %] 95 % (11/04 0808) FiO2 (%):  [80 %] 80 % (11/04 0808) Weight:  [435 lb (197.315 kg)] 435 lb (197.315 kg) (11/04 0500) Last BM Date: 11/19/12  Filed Weights   11/20/12 0717 11/22/12 0249 11/24/12 0500  Weight: 432 lb 4.8 oz (196.09 kg) 429 lb 14.4 oz (195 kg) 435 lb (197.315 kg)    Intake/Output Summary (Last 24 hours) at 11/24/12 0817 Last data filed at 11/24/12 0601  Gross per 24 hour  Intake 2215.14 ml  Output   1775 ml  Net 440.14 ml    Telemetry: NSR 70s  Exam:  General: intubated, sedated  Neck  Unable to assess JVP    Lungs: Upper airway and lobe congestion, diminished in the bases.  Cardiac: . Distant heart sounds, RRR, no gallop or rub.   Abdomen: Obese.  Supple    Extremities: 2+ pitting edema, distal pulses full.  Neuropsychiatric: Sedated unable to assess. Will open eyes to verbal stimulation.  Lab Results:  Basic Metabolic Panel:  Recent Labs Lab 11/21/12 0506 11/22/12 0451 11/23/12 0430 11/24/12 0448  NA 141 140 144 141  K 3.5 2.5* 2.5* 3.0*  CL 91* 91* 92* 92*  CO2 45* >45* 43* 43*  GLUCOSE 102* 98 119* 111*  BUN 9 10 11 12   CREATININE 0.89 0.89 0.95 0.92  CALCIUM 9.0 8.9 8.6 9.0  MG  --  1.8 1.8  --      CBC:  Recent Labs Lab 11/20/12 0526 11/22/12 0451 11/23/12 0430  WBC 11.1* 10.5 10.3  HGB 11.0* 10.1* 9.8*  HCT 40.7 36.7 35.4*  MCV 80.6 77.9* 77.3*  PLT 347 293 276    Cardiac Enzymes:  Recent Labs Lab 11/19/12 1428  TROPONINI <0.30    BNP:  Recent Labs  11/19/12 1428  PROBNP 3418.0*    Coagulation:  Recent Labs Lab 11/19/12 1428  INR 1.14    Radiology: Dg Chest Port 1  View  11/24/2012   CLINICAL DATA:  Respiratory failure  EXAM: PORTABLE CHEST - 1 VIEW  COMPARISON:  11/22/2012  FINDINGS: Cardiac shadow is stable. An endotracheal tube and nasogastric catheter is well as a left-sided PICC line are again seen and stable. Patchy infiltrative changes are noted in the bases bilaterally worse on the left than the right. No new focal abnormality is seen.  IMPRESSION: Persistent bibasilar changes.   Electronically Signed   By: Alcide Clever M.D.   On: 11/24/2012 07:22     Medications:   Scheduled Medications: . ipratropium  0.5 mg Nebulization Q4H   And  . albuterol  2.5 mg Nebulization Q4H  . antiseptic oral rinse  15 mL Mouth Rinse QID  . aspirin  81 mg Oral Daily  . azithromycin  500 mg Intravenous Q24H  . cefTRIAXone (ROCEPHIN)  IV  1 g Intravenous Q24H  . chlorhexidine  15 mL Mouth Rinse BID  . enoxaparin (LOVENOX) injection  100 mg Subcutaneous Q24H  . feeding supplement (VITAL AF 1.2 CAL)  1,000 mL Per Tube Q24H  . furosemide  40 mg Intravenous Q8H  . losartan  100 mg Oral Daily  . multivitamin with minerals  1 tablet Oral  Daily  . nicotine  21 mg Transdermal Daily  . pantoprazole (PROTONIX) IV  40 mg Intravenous Daily  . potassium chloride  10 mEq Intravenous Q1 Hr x 6  . sodium chloride  10-40 mL Intracatheter Q12H  . sodium chloride  3 mL Intravenous Q12H  . vancomycin  1,250 mg Intravenous Q12H     Infusions: . sodium chloride 20 mL/hr at 11/23/12 1600  . fentaNYL infusion INTRAVENOUS    . propofol 40.201 mcg/kg/min (11/24/12 0804)     PRN Medications:  sodium chloride, acetaminophen, acetaminophen, fentaNYL, nicotine polacrilex, ondansetron (ZOFRAN) IV, sodium chloride, sodium chloride, sodium chloride, sodium chloride   Assessment and Plan:   1. Anasarca : Multifactorial with  respiratory failure, obesity hypoventilation syndrome, and COPD. Continued volume overloaded. She was taken off of IV diuretics in the setting of hypotension.  He started on Neo-Synephrine.Output is positve 540 yesterday but net negative 4.4 L.   2.  VDRF: Continues intubated, and sedated. High FiO2  Ventilator management per Dr. Abbe Amsterdam, along with IV antibiotics and steroids.  3. Hypertension with complications of diastolic CHF: Patient became hypotensive last night Lasix held  Will stop Losartan as it is not helping with diuresis.    4.  Hypokalemia.  Patient needs repletion and continued supplementation.  It would be preferable to use enteral route (though absorption may be down with marked edema) as IV presents a signif volume load  For 6 runs of KCL she will receive 600  Cc IVF.  Check Mg.    Bettey Mare. Lyman Bishop NP [facility] 11/24/2012, 8:17 AM  Patient seen and examined  I have amended note above by Harriet Pho to reflect my findings.  Would continue duresis in attempt to decrease FiO2 requirements.

## 2012-11-24 NOTE — Progress Notes (Signed)
NUTRITION FOLLOW UP  Intervention:   If pt cleared to resume tube feeding; continue Vital AF @ 20 ml/hr. This will provide 576 kcals, 36 grams protein, and 389 ml fluid daily at rate.  With propofol at current rate, calories provided will be 1632 kcals and 36 grams of protein, which meets 103% of estimated calorie needs and 23% of estimated protein needs. Likely will be unable to meet protein goals at this time until pt able to decrease propofol rate.  Nutrition Dx:   Inadequate oral intake related to inability to eat as evidenced by NPO status.  Goal:   Enteral nutrition to provide 60-70% of estimated calorie needs (22-25 kcals/kg ideal body weight) and 100% of estimated protein needs, based on ASPEN guidelines for permissive underfeeding in critically ill obese individuals.  Monitor:    Respiratory status, nutrition support measures, wt changes and labs  Assessment:  Pt enteral feeding held currently due to orogastric tube dislodged. Monitoring for signs of aspiration. She continues on ventilator support.  Estimated needs re-assessed. No BM since 11/19/12. MV: 8.1  L/min Temp:Temp (24hrs), Avg:99.6 F (37.6 C), Min:99.1 F (37.3 C), Max:99.8 F (37.7 C)  Propofol: 41 ml/hr provides caloric lipid load of 1082 kcals q 24 hr at current rate.  Height: Ht Readings from Last 1 Encounters:  11/21/12 5\' 7"  (1.702 m)  IBW- 135# (61.3 kg)  Weight Status:   Wt Readings from Last 1 Encounters:  11/24/12 435 lb (197.315 kg)    Re-estimated needs:  Kcal: 2822 (60-70%=1693-1975 kcal daily) Protein:122-134 gr  Fluid: per team goals  Skin: intact  Diet Order: NPO   Intake/Output Summary (Last 24 hours) at 11/24/12 1608 Last data filed at 11/24/12 1400  Gross per 24 hour  Intake 2555.18 ml  Output   1025 ml  Net 1530.18 ml  Net since admission 11/19/12 (-3.040 liters)   Last BM: 11/19/12   Labs:   Recent Labs Lab 11/22/12 0451 11/23/12 0430 11/24/12 0448 11/24/12 0930   NA 140 144 141  --   K 2.5* 2.5* 3.0*  --   CL 91* 92* 92*  --   CO2 >45* 43* 43*  --   BUN 10 11 12   --   CREATININE 0.89 0.95 0.92  --   CALCIUM 8.9 8.6 9.0  --   MG 1.8 1.8  --  1.9  GLUCOSE 98 119* 111*  --     CBG (last 3)  No results found for this basename: GLUCAP,  in the last 72 hours  Scheduled Meds: . ipratropium  0.5 mg Nebulization Q4H   And  . albuterol  2.5 mg Nebulization Q4H  . antiseptic oral rinse  15 mL Mouth Rinse QID  . aspirin  81 mg Oral Daily  . azithromycin  500 mg Intravenous Q24H  . cefTRIAXone (ROCEPHIN)  IV  1 g Intravenous Q24H  . chlorhexidine  15 mL Mouth Rinse BID  . enoxaparin (LOVENOX) injection  100 mg Subcutaneous Q24H  . feeding supplement (VITAL AF 1.2 CAL)  1,000 mL Per Tube Q24H  . furosemide  60 mg Intravenous Q8H  . methylPREDNISolone (SOLU-MEDROL) injection  40 mg Intravenous Q12H  . multivitamin with minerals  1 tablet Oral Daily  . nicotine  21 mg Transdermal Daily  . pantoprazole (PROTONIX) IV  40 mg Intravenous Daily  . potassium chloride  40 mEq Per Tube TID  . sodium chloride  10-40 mL Intracatheter Q12H  . sodium chloride  3 mL Intravenous Q12H  .  vancomycin  1,250 mg Intravenous Q12H    Continuous Infusions: . sodium chloride 20 mL/hr at 11/24/12 1400  . fentaNYL infusion INTRAVENOUS 30 mcg/hr (11/24/12 1400)  . propofol 34.846 mcg/kg/min (11/24/12 1600)    Royann Shivers MS,RD,CSG,LDN Office: [phone] Pager: [phone]

## 2012-11-24 NOTE — Progress Notes (Signed)
[facility] PROGRESS NOTE  Meghan Hoover GNF:[id number] DOB: 12-28-1960 DOA: 11/19/2012 PCP: Kirk Ruths, MD  Summary: 52 year old woman presented to the emergency department with increasing shortness of breath. Initial evaluation was notable for hypoxic respiratory failure, massive volume overload from feet up to abdomen suggesting CHF.  Assessment/Plan: 1. VDRF, acute respiratory acidosis: No significant change. Chest x-ray showed some improvement. I suspect the primary driving component is acute diastolic heart failure complicated by suspected obesity hypoventilation syndrome, suspected sleep apnea, suspected COPD. I do not think pneumonia is playing a large role at this point. 2. Community acquired pneumonia, possible developing sepsis: Remains afebrile, hemodynamically stable. Lactic acid was normal. Sputum culture was unremarkable. Continue empiric antibiotics. 3. Acute diastolic congestive heart failure with anasarca: Complicated by pulmonary hypertension, suspected cor pulmonale, long-term smoking. Creatinine preserved. Continue diuretics. 4. Anemia of critical illness: Overall appears stable. Transfuse for hemoglobin less than 7. 5. Hypokalemia: Replete. 6. Hypertension: Stable. 7. Morbid obesity 8. Tobacco dependence: Patient has no desire to quit.  Patient remains critically ill. Still with high FiO2 requirement. Cannot wean today.   Continue empiric antibiotics, respiratory culture was unrevealing.  Continue diuresis per cardiology  Replace potassium.  Pending studies:   Blood cultures  Code Status: full code DVT prophylaxis: Lovenox Family Communication: None present Disposition Plan: home when improved  Brendia Sacks, MD  [facility]  Pager [phone] If 7PM-7AM, please contact night-coverage at [email or web address], password [facility] 11/24/2012, 9:41 AM  LOS: 5 days   Consultants:  Pulmonology  Cardiology  Procedures:  2-D echocardiogram: LVEF  65-70%; Grade 2 diastolic dysfunction. Moderate pulmonary HTN, possible cor pulmonale.  11/1: PICC line placement  HPI/Subjective: Transient hypotension overnight, Lasix held. No other issues overnight. Remains on 80% FiO2. This morning the patient pulled the feeding tube out, contents were suctioned out from subglottic tube.  Objective: Filed Vitals:   11/24/12 0715 11/24/12 0730 11/24/12 0739 11/24/12 0808  BP: 125/64 129/58    Pulse: 78 77  74  Temp:   99.5 F (37.5 C)   TempSrc:   Axillary   Resp: 14 14  16   Height:      Weight:      SpO2: 95% 94%  95%    Intake/Output Summary (Last 24 hours) at 11/24/12 0941 Last data filed at 11/24/12 0601  Gross per 24 hour  Intake 2115.14 ml  Output   1775 ml  Net 340.14 ml     Filed Weights   11/20/12 0717 11/22/12 0249 11/24/12 0500  Weight: 196.09 kg (432 lb 4.8 oz) 195 kg (429 lb 14.4 oz) 197.315 kg (435 lb)    Exam:   Afebrile, vital signs are stable. FiO2 80%.  General: Intubated, sedated, appears calm  Eyes: Pupils equal, round, react to light, normal lids.  ENT: Lips appear unremarkable.  Cardiovascular: Regular rate and rhythm. No murmur, rub or gallop. Perhaps some decrease in bilateral lower extremity edema, 3+.  Respiratory: Clear to auscultation bilaterally. Fair air movement. No air trapping on ventilator waveform. No frank wheezes, rales or rhonchi.  Abdomen: Obese, soft.  Skin: No new rash or induration seen.  Neurologic: Cannot assess  Psychiatric: Cannot assess  Data Reviewed:  Fair urine output, Lasix was held  ABG pH 7.41. PCO2 67.  Potassium improved, 3.0. Magnesium normal.  Chest x-ray with persistent bibasilar changes.   Scheduled Meds: . ipratropium  0.5 mg Nebulization Q4H   And  . albuterol  2.5 mg Nebulization Q4H  . antiseptic oral rinse  15 mL Mouth Rinse QID  . aspirin  81 mg Oral Daily  . azithromycin  500 mg Intravenous Q24H  . cefTRIAXone (ROCEPHIN)  IV  1 g  Intravenous Q24H  . chlorhexidine  15 mL Mouth Rinse BID  . enoxaparin (LOVENOX) injection  100 mg Subcutaneous Q24H  . feeding supplement (VITAL AF 1.2 CAL)  1,000 mL Per Tube Q24H  . furosemide  60 mg Intravenous Q8H  . methylPREDNISolone (SOLU-MEDROL) injection  40 mg Intravenous Q12H  . multivitamin with minerals  1 tablet Oral Daily  . nicotine  21 mg Transdermal Daily  . pantoprazole (PROTONIX) IV  40 mg Intravenous Daily  . potassium chloride  10 mEq Intravenous Q1 Hr x 6  . sodium chloride  10-40 mL Intracatheter Q12H  . sodium chloride  3 mL Intravenous Q12H  . vancomycin  1,250 mg Intravenous Q12H   Continuous Infusions: . sodium chloride 20 mL/hr at 11/23/12 1600  . fentaNYL infusion INTRAVENOUS    . propofol 40.201 mcg/kg/min (11/24/12 0804)    Principal Problem:   Acute respiratory failure with hypoxia Active Problems:   Volume overload   Bilateral lower extremity edema   HTN (hypertension)   Normocytic anemia   Tobacco dependence   Acute diastolic congestive heart failure   CAP (community acquired pneumonia)   Sepsis   Time spent 30 minutes

## 2012-11-24 NOTE — Progress Notes (Signed)
Subjective: She remains intubated and on the ventilator. Her nurse this morning reports that this Capasso is complaining of pain whenever her sedation is reduced. She is receiving fentanyl intermittently. No other new complaints are noted.  Objective: Vital signs in last 24 hours: Temp:  [99.1 F (37.3 C)-99.8 F (37.7 C)] 99.5 F (37.5 C) (11/04 0739) Pulse Rate:  [70-81] 74 (11/04 0808) Resp:  [14-18] 16 (11/04 0808) BP: (80-129)/(34-66) 129/58 mmHg (11/04 0730) SpO2:  [91 %-99 %] 95 % (11/04 0808) FiO2 (%):  [80 %] 80 % (11/04 0808) Weight:  [197.315 kg (435 lb)] 197.315 kg (435 lb) (11/04 0500) Weight change:  Last BM Date: 11/19/12  Intake/Output from previous day: 11/03 0701 - 11/04 0700 In: 2315.1 [I.V.:1165.1; IV Piggyback:1150] Out: 1775 [Urine:1775]  PHYSICAL EXAM General appearance: morbidly obese and Intubated and sedated Resp: Diminished breath sounds with prolonged expiration Cardio: regular rate and rhythm, S1, S2 normal, no murmur, click, rub or gallop GI: soft, non-tender; bowel sounds normal; no masses,  no organomegaly Extremities: She still has edema of the extremities and has a wound on her left leg  Lab Results:    Basic Metabolic Panel:  Recent Labs  21/30/86 0451 11/23/12 0430 11/24/12 0448  NA 140 144 141  K 2.5* 2.5* 3.0*  CL 91* 92* 92*  CO2 >45* 43* 43*  GLUCOSE 98 119* 111*  BUN 10 11 12   CREATININE 0.89 0.95 0.92  CALCIUM 8.9 8.6 9.0  MG 1.8 1.8  --    Liver Function Tests: No results found for this basename: AST, ALT, ALKPHOS, BILITOT, PROT, ALBUMIN,  in the last 72 hours No results found for this basename: LIPASE, AMYLASE,  in the last 72 hours No results found for this basename: AMMONIA,  in the last 72 hours CBC:  Recent Labs  11/22/12 0451 11/23/12 0430  WBC 10.5 10.3  HGB 10.1* 9.8*  HCT 36.7 35.4*  MCV 77.9* 77.3*  PLT 293 276   Cardiac Enzymes: No results found for this basename: CKTOTAL, CKMB, CKMBINDEX,  TROPONINI,  in the last 72 hours BNP: No results found for this basename: PROBNP,  in the last 72 hours D-Dimer: No results found for this basename: DDIMER,  in the last 72 hours CBG: No results found for this basename: GLUCAP,  in the last 72 hours Hemoglobin A1C: No results found for this basename: HGBA1C,  in the last 72 hours Fasting Lipid Panel:  Recent Labs  11/24/12 0448  TRIG 129   Thyroid Function Tests: No results found for this basename: TSH, T4TOTAL, FREET4, T3FREE, THYROIDAB,  in the last 72 hours Anemia Panel: No results found for this basename: VITAMINB12, FOLATE, FERRITIN, TIBC, IRON, RETICCTPCT,  in the last 72 hours Coagulation: No results found for this basename: LABPROT, INR,  in the last 72 hours Urine Drug Screen: Drugs of Abuse  No results found for this basename: labopia, cocainscrnur, labbenz, amphetmu, thcu, labbarb    Alcohol Level: No results found for this basename: ETH,  in the last 72 hours Urinalysis: No results found for this basename: COLORURINE, APPERANCEUR, LABSPEC, PHURINE, GLUCOSEU, HGBUR, BILIRUBINUR, KETONESUR, PROTEINUR, UROBILINOGEN, NITRITE, LEUKOCYTESUR,  in the last 72 hours Misc. Labs:  ABGS  Recent Labs  11/23/12 0945 11/24/12 0410  PHART 7.452* 7.411  PO2ART 72.4* 70.1*  TCO2 39.4  --   HCO3 42.6* 41.8*   CULTURES Recent Results (from the past 240 hour(s))  MRSA PCR SCREENING     Status: None   Collection Time  11/20/12  9:29 PM      Result Value Range Status   MRSA by PCR NEGATIVE  NEGATIVE Final   Comment:            The GeneXpert MRSA Assay (FDA     approved for NASAL specimens     only), is one component of a     comprehensive MRSA colonization     surveillance program. It is not     intended to diagnose MRSA     infection nor to guide or     monitor treatment for     MRSA infections.  CULTURE, RESPIRATORY (NON-EXPECTORATED)     Status: None   Collection Time    11/21/12  9:27 AM      Result Value  Range Status   Specimen Description TRACHEAL ASPIRATE   Final   Special Requests NONE   Final   Gram Stain     Final   Value: ABUNDANT WBC PRESENT, PREDOMINANTLY PMN     RARE SQUAMOUS EPITHELIAL CELLS PRESENT     MODERATE GRAM POSITIVE COCCI IN PAIRS AND CHAINS     Performed at [facility]   Culture     Final   Value: Non-Pathogenic Oropharyngeal-type Flora Isolated.     Performed at [facility]   Report Status PENDING   Incomplete  CULTURE, BLOOD (ROUTINE X 2)     Status: None   Collection Time    11/21/12  9:30 AM      Result Value Range Status   Specimen Description     Final   Value: BLOOD BLOOD RIGHT HAND BOTTLES DRAWN AEROBIC AND ANAEROBIC   Special Requests 8CC   Final   Culture NO GROWTH 2 DAYS   Final   Report Status PENDING   Incomplete  CULTURE, BLOOD (ROUTINE X 2)     Status: None   Collection Time    11/21/12  9:39 AM      Result Value Range Status   Specimen Description     Final   Value: BLOOD BLOOD LEFT HAND BOTTLES DRAWN AEROBIC AND ANAEROBIC   Special Requests 8CC   Final   Culture NO GROWTH 2 DAYS   Final   Report Status PENDING   Incomplete   Studies/Results: Dg Chest Port 1 View  11/24/2012   CLINICAL DATA:  Respiratory failure  EXAM: PORTABLE CHEST - 1 VIEW  COMPARISON:  11/22/2012  FINDINGS: Cardiac shadow is stable. An endotracheal tube and nasogastric catheter is well as a left-sided PICC line are again seen and stable. Patchy infiltrative changes are noted in the bases bilaterally worse on the left than the right. No new focal abnormality is seen.  IMPRESSION: Persistent bibasilar changes.   Electronically Signed   By: Alcide Clever M.D.   On: 11/24/2012 07:22    Medications:  Scheduled: . ipratropium  0.5 mg Nebulization Q4H   And  . albuterol  2.5 mg Nebulization Q4H  . antiseptic oral rinse  15 mL Mouth Rinse QID  . aspirin  81 mg Oral Daily  . azithromycin  500 mg Intravenous Q24H  . cefTRIAXone (ROCEPHIN)  IV  1 g Intravenous  Q24H  . chlorhexidine  15 mL Mouth Rinse BID  . enoxaparin (LOVENOX) injection  100 mg Subcutaneous Q24H  . feeding supplement (VITAL AF 1.2 CAL)  1,000 mL Per Tube Q24H  . furosemide  40 mg Intravenous Q8H  . losartan  100 mg Oral Daily  . multivitamin  with minerals  1 tablet Oral Daily  . nicotine  21 mg Transdermal Daily  . pantoprazole (PROTONIX) IV  40 mg Intravenous Daily  . potassium chloride  10 mEq Intravenous Q1 Hr x 6  . sodium chloride  10-40 mL Intracatheter Q12H  . sodium chloride  3 mL Intravenous Q12H  . vancomycin  1,250 mg Intravenous Q12H   Continuous: . sodium chloride 20 mL/hr at 11/23/12 1600  . fentaNYL infusion INTRAVENOUS    . propofol 40.201 mcg/kg/min (11/24/12 0804)   WUJ:WJXBJY chloride, acetaminophen, acetaminophen, fentaNYL, nicotine polacrilex, ondansetron (ZOFRAN) IV, sodium chloride, sodium chloride, sodium chloride, sodium chloride  Assesment: She has acute on chronic respiratory failure. This is multifactorial and causes include community-acquired pneumonia, acute diastolic congestive heart failure, COPD obesity hypoventilation and sleep apnea. I think she is still somewhat volume overloaded. She is complaining of pain when she's alert enough. She is still on 80% oxygen so we can't wean her today. I think her chest x-ray looks a little better today however Principal Problem:   Acute respiratory failure with hypoxia Active Problems:   Volume overload   Bilateral lower extremity edema   HTN (hypertension)   Normocytic anemia   Tobacco dependence   Acute diastolic congestive heart failure   CAP (community acquired pneumonia)   Sepsis    Plan: Continue efforts at diuresis. I switched her to a continuous fentanyl drip. Continue ventilator support. See if we can get her down as far as her FiO2. Continue antibiotics etc. I think I will add low dose Solu-Medrol to see if it makes any difference    LOS: 5 days   Kamarion Zagami L 11/24/2012, 8:27  AM

## 2012-11-24 NOTE — Progress Notes (Signed)
Patient dislodged OGT while staff was out of room. Moderate amount of tube feed suctioned from mouth, and coming from subglottic suction. Dr Irene Limbo and Dr Juanetta Gosling notified in person. Lung sounds, and sats unchanged.

## 2012-11-24 NOTE — Progress Notes (Signed)
Pt had a critical ABG CO2 of 67.1. Dr. Orvan Falconer notified. No new orders given.

## 2012-11-25 ENCOUNTER — Inpatient Hospital Stay (HOSPITAL_COMMUNITY): Payer: BC Managed Care – PPO

## 2012-11-25 LAB — BLOOD GAS, ARTERIAL
Bicarbonate: 39.3 mEq/L — ABNORMAL HIGH (ref 20.0–24.0)
PEEP: 8 cmH2O
Patient temperature: 37
TCO2: 36.3 mmol/L (ref 0–100)
pCO2 arterial: 61.7 mmHg (ref 35.0–45.0)
pH, Arterial: 7.42 (ref 7.350–7.450)

## 2012-11-25 LAB — COMPREHENSIVE METABOLIC PANEL
Albumin: 2.6 g/dL — ABNORMAL LOW (ref 3.5–5.2)
BUN: 15 mg/dL (ref 6–23)
Calcium: 9.3 mg/dL (ref 8.4–10.5)
Creatinine, Ser: 0.83 mg/dL (ref 0.50–1.10)
GFR calc Af Amer: >90 mL/min (ref >90)
Glucose, Bld: 152 mg/dL — ABNORMAL HIGH (ref 70–99)
Potassium: 3.8 mEq/L (ref 3.5–5.1)
Total Protein: 6.7 g/dL (ref 6.0–8.3)

## 2012-11-25 LAB — MAGNESIUM: Magnesium: 2.1 mg/dL (ref 1.5–2.5)

## 2012-11-25 MED ORDER — ADULT MULTIVITAMIN LIQUID CH
5.0000 mL | Freq: Every day | ORAL | Status: DC
  Administered 2012-11-25 – 2012-12-09 (×15): 5 mL via ORAL
  Filled 2012-11-25 (×17): qty 5

## 2012-11-25 MED ORDER — ALBUMIN HUMAN 25 % IV SOLN
25.0000 g | Freq: Three times a day (TID) | INTRAVENOUS | Status: AC
  Administered 2012-11-25 (×3): 25 g via INTRAVENOUS
  Filled 2012-11-25 (×3): qty 100

## 2012-11-25 MED ORDER — FENTANYL CITRATE 0.05 MG/ML IJ SOLN
INTRAMUSCULAR | Status: AC
  Filled 2012-11-25: qty 50

## 2012-11-25 NOTE — Progress Notes (Signed)
Subjective: She remains intubated and on the ventilator. She had an episode of pulling out her feeding tube and may have aspirated yesterday. She is still requiring high FiO2. She has not mobilize fluid as well as I would like  Objective: Vital signs in last 24 hours: Temp:  [98 F (36.7 C)-99.2 F (37.3 C)] 98 F (36.7 C) (11/05 0800) Pulse Rate:  [67-81] 71 (11/05 0845) Resp:  [13-19] 16 (11/05 0845) BP: (96-139)/(43-100) 96/49 mmHg (11/05 0845) SpO2:  [87 %-97 %] 94 % (11/05 0845) FiO2 (%):  [80 %-85 %] 85 % (11/05 0715) Weight:  [195.863 kg (431 lb 12.8 oz)] 195.863 kg (431 lb 12.8 oz) (11/05 0500) Weight change: -1.452 kg (-3 lb 3.2 oz) Last BM Date: 11/19/12  Intake/Output from previous day: 11/04 0701 - 11/05 0700 In: 1991.2 [I.V.:991.2; IV Piggyback:1000] Out: 2950 [Urine:2950]  PHYSICAL EXAM General appearance: mild distress, morbidly obese and Intubated and on the ventilator Resp: rhonchi bilaterally Cardio: regular rate and rhythm, S1, S2 normal, no murmur, click, rub or gallop GI: Her abdomen is obese and she does have positive bowel sounds. She still has some evidence of anasarca Extremities: Venous stasis changes some edema and still a wound on her left calf  Lab Results:    Basic Metabolic Panel:  Recent Labs  08/65/78 0448 11/24/12 0930 11/25/12 0430  NA 141  --  140  K 3.0*  --  3.8  CL 92*  --  93*  CO2 43*  --  41*  GLUCOSE 111*  --  152*  BUN 12  --  15  CREATININE 0.92  --  0.83  CALCIUM 9.0  --  9.3  MG  --  1.9 2.1   Liver Function Tests:  Recent Labs  11/24/12 0448 11/25/12 0430  AST 23 27  ALT 11 15  ALKPHOS 68 71  BILITOT 0.6 0.4  PROT 6.6 6.7  ALBUMIN 2.7* 2.6*   No results found for this basename: LIPASE, AMYLASE,  in the last 72 hours No results found for this basename: AMMONIA,  in the last 72 hours CBC:  Recent Labs  11/23/12 0430  WBC 10.3  HGB 9.8*  HCT 35.4*  MCV 77.3*  PLT 276   Cardiac Enzymes: No  results found for this basename: CKTOTAL, CKMB, CKMBINDEX, TROPONINI,  in the last 72 hours BNP: No results found for this basename: PROBNP,  in the last 72 hours D-Dimer: No results found for this basename: DDIMER,  in the last 72 hours CBG: No results found for this basename: GLUCAP,  in the last 72 hours Hemoglobin A1C: No results found for this basename: HGBA1C,  in the last 72 hours Fasting Lipid Panel:  Recent Labs  11/24/12 0448  TRIG 129   Thyroid Function Tests: No results found for this basename: TSH, T4TOTAL, FREET4, T3FREE, THYROIDAB,  in the last 72 hours Anemia Panel: No results found for this basename: VITAMINB12, FOLATE, FERRITIN, TIBC, IRON, RETICCTPCT,  in the last 72 hours Coagulation: No results found for this basename: LABPROT, INR,  in the last 72 hours Urine Drug Screen: Drugs of Abuse  No results found for this basename: labopia, cocainscrnur, labbenz, amphetmu, thcu, labbarb    Alcohol Level: No results found for this basename: ETH,  in the last 72 hours Urinalysis: No results found for this basename: COLORURINE, APPERANCEUR, LABSPEC, PHURINE, GLUCOSEU, HGBUR, BILIRUBINUR, KETONESUR, PROTEINUR, UROBILINOGEN, NITRITE, LEUKOCYTESUR,  in the last 72 hours Misc. Labs:  ABGS  Recent Labs  11/25/12  0353  PHART 7.420  PO2ART 60.7*  TCO2 36.3  HCO3 39.3*   CULTURES Recent Results (from the past 240 hour(s))  MRSA PCR SCREENING     Status: None   Collection Time    11/20/12  9:29 PM      Result Value Range Status   MRSA by PCR NEGATIVE  NEGATIVE Final   Comment:            The GeneXpert MRSA Assay (FDA     approved for NASAL specimens     only), is one component of a     comprehensive MRSA colonization     surveillance program. It is not     intended to diagnose MRSA     infection nor to guide or     monitor treatment for     MRSA infections.  CULTURE, RESPIRATORY (NON-EXPECTORATED)     Status: None   Collection Time    11/21/12  9:27 AM       Result Value Range Status   Specimen Description TRACHEAL ASPIRATE   Final   Special Requests NONE   Final   Gram Stain     Final   Value: ABUNDANT WBC PRESENT, PREDOMINANTLY PMN     RARE SQUAMOUS EPITHELIAL CELLS PRESENT     MODERATE GRAM POSITIVE COCCI IN PAIRS AND CHAINS     Performed at [facility]   Culture     Final   Value: Non-Pathogenic Oropharyngeal-type Flora Isolated.     Performed at [facility]   Report Status 11/24/2012 FINAL   Final  CULTURE, BLOOD (ROUTINE X 2)     Status: None   Collection Time    11/21/12  9:30 AM      Result Value Range Status   Specimen Description BLOOD RIGHT HAND   Final   Special Requests BOTTLES DRAWN AEROBIC AND ANAEROBIC 8CC   Final   Culture NO GROWTH 3 DAYS   Final   Report Status PENDING   Incomplete  CULTURE, BLOOD (ROUTINE X 2)     Status: None   Collection Time    11/21/12  9:39 AM      Result Value Range Status   Specimen Description BLOOD LEFT HAND   Final   Special Requests BOTTLES DRAWN AEROBIC AND ANAEROBIC 8CC   Final   Culture NO GROWTH 3 DAYS   Final   Report Status PENDING   Incomplete   Studies/Results: Dg Chest Port 1 View  11/25/2012   CLINICAL DATA:  Respiratory failure  EXAM: PORTABLE CHEST - 1 VIEW  COMPARISON:  November 24, 2012.  FINDINGS: The lungs remain hypo inflated. The esophagogastric tube tip projects off the film. An endotracheal tube is is felt to be present with the tip 1 cm below the inferior margin of the clavicular heads. A left PICC catheter is present with the tip in the region of the proximal to mid SVC. The cardiac silhouette and aortic silhouette remains prominent and it margin is indistinct. The left hemidiaphragm remains obscured. Atelectatic change in the right lower lung is less conspicuous today.  IMPRESSION: 1. The retrocardiac region remains quite dense consistent with atelectasis and or pneumonia. Pleural fluid is not excluded. 2. The interstitial markings in the right lung  remain increased but some resolution of subsegmental atelectasis may be present. 3. The endotracheal tube tip appears to lie approximately 1 cm below the inferior margin of the clavicular head.   Electronically Signed   By: Onalee Hua  [place]   On: 11/25/2012 08:01   Dg Chest Port 1 View  11/24/2012   CLINICAL DATA:  Status post OG tube placement  EXAM: PORTABLE CHEST - 1 VIEW  COMPARISON:  None.  FINDINGS: The cardiac shadow is stable. The lungs are stable in appearance. AA gastric catheter is noted however the tip of the catheter is not visualized on this film. It can be traced at least to the level of the aortic knob. An abdomen film was recommended for further evaluation.   Electronically Signed   By: Alcide Clever M.D.   On: 11/24/2012 13:48   Dg Chest Port 1 View  11/24/2012   CLINICAL DATA:  Respiratory failure  EXAM: PORTABLE CHEST - 1 VIEW  COMPARISON:  11/22/2012  FINDINGS: Cardiac shadow is stable. An endotracheal tube and nasogastric catheter is well as a left-sided PICC line are again seen and stable. Patchy infiltrative changes are noted in the bases bilaterally worse on the left than the right. No new focal abnormality is seen.  IMPRESSION: Persistent bibasilar changes.   Electronically Signed   By: Alcide Clever M.D.   On: 11/24/2012 07:22    Medications:  Prior to Admission:  Prescriptions prior to admission  Medication Sig Dispense Refill  . betamethasone dipropionate (DIPROLENE) 0.05 % cream Apply 1 application topically 2 (two) times daily as needed. rash      . esomeprazole (NEXIUM) 40 MG capsule Take 40 mg by mouth daily before breakfast.      . furosemide (LASIX) 20 MG tablet Take 10 mg by mouth daily.       Marland Kitchen losartan (COZAAR) 100 MG tablet Take 100 mg by mouth daily.      . methocarbamol (ROBAXIN) 500 MG tablet Take 500 mg by mouth daily.        Scheduled: . albumin human  25 g Intravenous Q8H  . ipratropium  0.5 mg Nebulization Q4H   And  . albuterol  2.5 mg Nebulization Q4H   . antiseptic oral rinse  15 mL Mouth Rinse QID  . aspirin  81 mg Oral Daily  . azithromycin  500 mg Intravenous Q24H  . cefTRIAXone (ROCEPHIN)  IV  1 g Intravenous Q24H  . chlorhexidine  15 mL Mouth Rinse BID  . enoxaparin (LOVENOX) injection  100 mg Subcutaneous Q24H  . feeding supplement (VITAL AF 1.2 CAL)  1,000 mL Per Tube Q24H  . furosemide  60 mg Intravenous Q8H  . methylPREDNISolone (SOLU-MEDROL) injection  40 mg Intravenous Q12H  . multivitamin with minerals  1 tablet Oral Daily  . nicotine  21 mg Transdermal Daily  . pantoprazole (PROTONIX) IV  40 mg Intravenous Daily  . potassium chloride  40 mEq Per Tube TID  . sodium chloride  10-40 mL Intracatheter Q12H  . sodium chloride  3 mL Intravenous Q12H  . vancomycin  1,250 mg Intravenous Q12H   Continuous: . sodium chloride 20 mL/hr at 11/24/12 1400  . fentaNYL infusion INTRAVENOUS 30 mcg/hr (11/25/12 0800)  . propofol 25 mcg/kg/min (11/25/12 0800)   ZOX:WRUEAV chloride, acetaminophen, acetaminophen, fentaNYL, ondansetron (ZOFRAN) IV, sodium chloride, sodium chloride, sodium chloride, sodium chloride  Assesment: She has acute on chronic respiratory failure. She has community-acquired pneumonia but I think the bigger problem now is her acute diastolic and right heart failure. She still has significant edema. She's not mobilizing the fluid as well as I would like to see. Part of the problem has been that she may have aspirated. In addition her blood  pressure has been somewhat soft so she's had some of the Lasix held. Although she's required very high FiO2 I don't think this is ARDS Principal Problem:   Acute respiratory failure with hypoxia Active Problems:   Volume overload   Bilateral lower extremity edema   HTN (hypertension)   Normocytic anemia   Tobacco dependence   Acute diastolic congestive heart failure   CAP (community acquired pneumonia)   Sepsis    Plan: I'm going to see if it makes any difference if we give her  albumin prior to the Lasix and perhaps mobilize the fluid a little bit better. Her albumin is not severely low but it is low and this may help.     LOS: 6 days   Ondine Gemme L 11/25/2012, 8:51 AM

## 2012-11-25 NOTE — Progress Notes (Signed)
[facility] PROGRESS NOTE  Meghan Hoover VHQ:[id number] DOB: Sep 25, 1960 DOA: 11/19/2012 PCP: Kirk Ruths, MD  Summary: 52 year old woman presented to the emergency department with increasing shortness of breath. Initial evaluation was notable for hypoxic respiratory failure, massive volume overload from feet up to abdomen suggesting CHF.  Assessment/Plan: 1. VDRF, acute respiratory acidosis: No significant change. Chest x-ray showed some improvement. I suspect the primary driving component is acute diastolic heart failure complicated by suspected obesity hypoventilation syndrome, suspected sleep apnea, suspected COPD. I do not think pneumonia is playing a large role at this point. 2. Community acquired pneumonia, possible developing sepsis: Remains afebrile, hemodynamically stable. Lactic acid was normal. Sputum culture was unremarkable. Continue empiric antibiotics. Today is day 5 of antibiotics. 3. Acute diastolic congestive heart failure with anasarca: Complicated by pulmonary hypertension, suspected cor pulmonale, long-term smoking. Creatinine preserved. Continue diuretics. Dr. Juanetta Gosling has added albumin infusions to see if this will help mobilize fluid. 4. Anemia of critical illness: Overall appears stable. Transfuse for hemoglobin less than 7. 5. Hypokalemia: Replete. 6. Hypertension: Stable. 7. Morbid obesity 8. Tobacco dependence: Patient has no desire to quit.  Code Status: full code DVT prophylaxis: Lovenox Family Communication: Discussed at bedside with husband [name] Disposition Plan: home when improved  Erick Blinks, MD  [facility]  Pager [phone] If 7PM-7AM, please contact night-coverage at [email or web address], password [facility] 11/25/2012, 9:11 AM  LOS: 6 days   Consultants:  Pulmonology  Cardiology  Procedures:  2-D echocardiogram: LVEF 65-70%; Grade 2 diastolic dysfunction. Moderate pulmonary HTN, possible cor pulmonale.  11/1: PICC line  placement  Intubation 11/1  HPI/Subjective: Patient remains intubated and sedated on ventilator.  Objective: Filed Vitals:   11/25/12 0800 11/25/12 0815 11/25/12 0830 11/25/12 0845  BP: 107/58 114/64 104/56 96/49  Pulse: 72 73 73 71  Temp: 98 F (36.7 C)     TempSrc: Oral     Resp: 16 14 16 16   Height:      Weight:      SpO2: 91% 93% 94% 94%    Intake/Output Summary (Last 24 hours) at 11/25/12 0911 Last data filed at 11/25/12 0800  Gross per 24 hour  Intake 2095.78 ml  Output   2950 ml  Net -854.22 ml     Filed Weights   11/22/12 0249 11/24/12 0500 11/25/12 0500  Weight: 195 kg (429 lb 14.4 oz) 197.315 kg (435 lb) 195.863 kg (431 lb 12.8 oz)    Exam:   Afebrile, vital signs are stable. FiO2 85%.  General: Intubated, sedated, appears calm  Eyes: Pupils equal, round, react to light, normal lids.  ENT: Lips appear unremarkable.  Cardiovascular: Regular rate and rhythm. No murmur, rub or gallop. Perhaps some decrease in bilateral lower extremity edema, 2+.  Respiratory: Clear to auscultation bilaterally. Fair air movement. No air trapping on ventilator waveform. No frank wheezes, rales or rhonchi.  Abdomen: Obese, soft.  Skin: No new rash or induration seen.  Neurologic: Cannot assess  Psychiatric: Cannot assess  Scheduled Meds: . albumin human  25 g Intravenous Q8H  . ipratropium  0.5 mg Nebulization Q4H   And  . albuterol  2.5 mg Nebulization Q4H  . antiseptic oral rinse  15 mL Mouth Rinse QID  . aspirin  81 mg Oral Daily  . azithromycin  500 mg Intravenous Q24H  . cefTRIAXone (ROCEPHIN)  IV  1 g Intravenous Q24H  . chlorhexidine  15 mL Mouth Rinse BID  . enoxaparin (LOVENOX) injection  100 mg Subcutaneous Q24H  .  feeding supplement (VITAL AF 1.2 CAL)  1,000 mL Per Tube Q24H  . furosemide  60 mg Intravenous Q8H  . methylPREDNISolone (SOLU-MEDROL) injection  40 mg Intravenous Q12H  . multivitamin with minerals  1 tablet Oral Daily  . nicotine  21  mg Transdermal Daily  . pantoprazole (PROTONIX) IV  40 mg Intravenous Daily  . potassium chloride  40 mEq Per Tube TID  . sodium chloride  10-40 mL Intracatheter Q12H  . sodium chloride  3 mL Intravenous Q12H  . vancomycin  1,250 mg Intravenous Q12H   Continuous Infusions: . sodium chloride 20 mL/hr at 11/24/12 1400  . fentaNYL infusion INTRAVENOUS 30 mcg/hr (11/25/12 0800)  . propofol 25 mcg/kg/min (11/25/12 0800)    Principal Problem:   Acute respiratory failure with hypoxia Active Problems:   Volume overload   Bilateral lower extremity edema   HTN (hypertension)   Normocytic anemia   Tobacco dependence   Acute diastolic congestive heart failure   CAP (community acquired pneumonia)   Sepsis   Time spent 30 minutes

## 2012-11-25 NOTE — Progress Notes (Signed)
Subjective:  Intubated.  Objective:  Vital Signs in the last 24 hours: Temp:  [98 F (36.7 C)-99.2 F (37.3 C)] 98 F (36.7 C) (11/05 0800) Pulse Rate:  [64-81] 64 (11/05 0930) Resp:  [13-19] 16 (11/05 0930) BP: (96-139)/(43-81) 105/56 mmHg (11/05 0915) SpO2:  [87 %-95 %] 93 % (11/05 1123) FiO2 (%):  [80 %-85 %] 85 % (11/05 1123) Weight:  [431 lb 12.8 oz (195.863 kg)] 431 lb 12.8 oz (195.863 kg) (11/05 0500)  Intake/Output from previous day: 11/04 0701 - 11/05 0700 In: 1991.2 [I.V.:991.2; IV Piggyback:1000] Out: 2950 [Urine:2950] Intake/Output from this shift: Total I/O In: 244.6 [I.V.:244.6] Out: -   Physical Exam:Intubated. NECK: Without JVD, HJR, or bruit LUNGS: Decreased breath sounds throughout. HEART: Regular rate and rhythm, no murmur, gallop, rub, bruit, thrill, or heave EXTREMITIES:plus 3 edema. Without cyanosis, clubbing   Lab Results:  Recent Labs  11/23/12 0430  WBC 10.3  HGB 9.8*  PLT 276    Recent Labs  11/24/12 0448 11/25/12 0430  NA 141 140  K 3.0* 3.8  CL 92* 93*  CO2 43* 41*  GLUCOSE 111* 152*  BUN 12 15  CREATININE 0.92 0.83   No results found for this basename: TROPONINI, CK, MB,  in the last 72 hours Hepatic Function Panel  Recent Labs  11/24/12 0448 11/25/12 0430  PROT 6.6 6.7  ALBUMIN 2.7* 2.6*  AST 23 27  ALT 11 15  ALKPHOS 68 71  BILITOT 0.6 0.4  BILIDIR 0.3  --   IBILI 0.3  --    No results found for this basename: CHOL,  in the last 72 hours No results found for this basename: PROTIME,  in the last 72 hours  Imaging:  Prior Cardiac Testing/Procedures 1. Echocardiogram 11/20/2012 Left ventricle: The cavity size was normal. There was mild concentric hypertrophy. Systolic function was vigorous. The estimated ejection fraction was in the range of 65% to 70%. Images were inadequate for LV wall motion assessment. Features are consistent with a pseudonormal left ventricular filling pattern, with concomitant  abnormal relaxation and increased filling pressure (grade 2 diastolic dysfunction). - Ventricular septum: The contour showed diastolic flattening and systolic flattening consistent with increased RV volume and pressure. - Aortic valve: Poorly visualized. Mildly calcified leaflets. No significant regurgitation. - Mitral valve: Mildly thickened leaflets . Trivial regurgitation. - Left atrium: The atrium was mildly dilated. - Right ventricle: The cavity size was severely dilated. Systolic function was reduced. - Right atrium: The atrium was moderately dilated. - Tricuspid valve: Mild regurgitation. - Pulmonary arteries: Systolic pressure was moderately increased. PA peak pressure: 50mm Hg (S). - Pericardium, extracardiac: There was no pericardial effusion.  No prior study for comparison. Images are quite limited. There is mild LVH with LVEF approximately 65-70%. Grade 2 diastolic dysfunction with increased filling pressures noted. Mild left atrial enlargement. Mildly thickened mitral valve with trivial mitral regurgitation. Aortic valve is mildly calcified and poorly seen overall. There is severe RV dilatation with at least mildly reduced function, somewhat difficult to assess. LV septal flattening is consistent with increased RV volume and pressure. Probable mild tricuspid regurgitation, PASP 50 mmHg with increased CVP. Consistent with moderate pulmonary hypertension. With clinical evidence of edema and volume overload, study would be consistent with components of diastolic heart failure as well as cor pulmonale.  Cardiac Studies:  Assessment/Plan:  1.Cor pulmonale, Anasarca : Multifactorial with  respiratory failure, obesity hypoventilation syndrome, and COPD, severe RV dilation.  Continue to diurese.     2.  VDRF: Continues intubated, and sedated.   3. Hypertension with complications of diastolic CHF: Patient became hypotensive 2 days ago. Has stabilized.  4.   Hypokalemia.  replaced.                LOS: 6 days    Meghan Hoover 11/25/2012, 11:47 AM   The patient was seen and examined, and I agree with the assessment and plan as documented above, with modifications as noted below. Continues with anasarca due to multifactorial etiology, with probable cor pulmonale based on echo findings. Given albumin to help mobilize fluids for a more optimal diuretic response. BP appears to be well controlled at present. Continue current therapies for now.

## 2012-11-26 ENCOUNTER — Inpatient Hospital Stay (HOSPITAL_COMMUNITY): Payer: BC Managed Care – PPO

## 2012-11-26 DIAGNOSIS — I2789 Other specified pulmonary heart diseases: Secondary | ICD-10-CM

## 2012-11-26 DIAGNOSIS — I2609 Other pulmonary embolism with acute cor pulmonale: Secondary | ICD-10-CM

## 2012-11-26 LAB — CULTURE, BLOOD (ROUTINE X 2): Culture: NO GROWTH

## 2012-11-26 LAB — CBC
MCH: 21.8 pg — ABNORMAL LOW (ref 26.0–34.0)
MCHC: 27.6 g/dL — ABNORMAL LOW (ref 30.0–36.0)
Platelets: 286 10*3/uL (ref 150–400)
RDW: 21.1 % — ABNORMAL HIGH (ref 11.5–15.5)

## 2012-11-26 LAB — BLOOD GAS, ARTERIAL
Drawn by: 317771
FIO2: 85 %
MECHVT: 500 mL
O2 Saturation: 92.3 %
Patient temperature: 37
RATE: 16 resp/min

## 2012-11-26 LAB — BASIC METABOLIC PANEL
Calcium: 9.6 mg/dL (ref 8.4–10.5)
Chloride: 97 mEq/L (ref 96–112)
GFR calc non Af Amer: 81 mL/min — ABNORMAL LOW (ref >90)
Glucose, Bld: 162 mg/dL — ABNORMAL HIGH (ref 70–99)
Sodium: 143 mEq/L (ref 135–145)

## 2012-11-26 MED ORDER — FUROSEMIDE 10 MG/ML IJ SOLN
3.0000 mg/h | INTRAVENOUS | Status: DC
  Administered 2012-11-26: 10 mg/h via INTRAVENOUS
  Administered 2012-11-26 – 2012-11-28 (×2): 4 mg/h via INTRAVENOUS
  Filled 2012-11-26 (×2): qty 25

## 2012-11-26 MED ORDER — PRO-STAT SUGAR FREE PO LIQD
30.0000 mL | Freq: Two times a day (BID) | ORAL | Status: DC
  Administered 2012-11-26 – 2012-11-27 (×2): 30 mL
  Filled 2012-11-26 (×2): qty 30

## 2012-11-26 NOTE — Progress Notes (Signed)
[facility] PROGRESS NOTE  Meghan Hoover ZOX:[id number] DOB: [birth year] DOA: 11/19/2012 PCP: Kirk Ruths, MD  Summary: 52 year old woman presented to the emergency department with increasing shortness of breath. Initial evaluation was notable for hypoxic respiratory failure, massive volume overload from feet up to abdomen suggesting CHF.  Assessment/Plan: 1. VDRF, acute respiratory acidosis: No significant change. I suspect the primary driving component is acute diastolic heart failure complicated by suspected obesity hypoventilation syndrome, suspected sleep apnea, suspected COPD. I do not think pneumonia is playing a large role at this point. We have been unable to wean her due to her very high oxygen requirement. 2. Community acquired pneumonia: Remains afebrile, hemodynamically stable. Lactic acid was normal. Sputum culture was unremarkable. WBC count is normal. I do not feel that this is playing a significant role in her respiratory failure.  Will discontinue antibiotics and observe. 3. Acute diastolic congestive heart failure with anasarca: Complicated by pulmonary hypertension, suspected cor pulmonale, long-term smoking. Creatinine preserved. Continue diuretics. Dr. Juanetta Gosling has added albumin infusions to see if this will help mobilize fluid. Diuresis has been unimpressive.  Will await further recommendations from cardiology regarding any other means to optimize diuresis. 4. Anemia of critical illness: Overall appears stable. Transfuse for hemoglobin less than 7. 5. Hypokalemia: Replete. 6. Hypertension: Stable. 7. Morbid obesity 8. Tobacco dependence: Patient has no desire to quit.  Code Status: full code DVT prophylaxis: Lovenox Family Communication: Discussed at bedside with husband [name] Disposition Plan: home when improved  Erick Blinks, MD  [facility]  Pager [phone] If 7PM-7AM, please contact night-coverage at [email or web address], password [facility] 11/26/2012, 11:48  AM  LOS: 7 days   Consultants:  Pulmonology  Cardiology  Procedures:  2-D echocardiogram: LVEF 65-70%; Grade 2 diastolic dysfunction. Moderate pulmonary HTN, possible cor pulmonale.  11/1: PICC line placement  Intubation 11/1  HPI/Subjective: Patient remains intubated and sedated on ventilator.  Objective: Filed Vitals:   11/26/12 0615 11/26/12 0630 11/26/12 0645 11/26/12 0828  BP: 103/54 104/53 103/53   Pulse: 67 67 67   Temp:      TempSrc:      Resp: 16 16 16    Height:      Weight:      SpO2: 93% 93% 92% 94%    Intake/Output Summary (Last 24 hours) at 11/26/12 1148 Last data filed at 11/26/12 1059  Gross per 24 hour  Intake 3479.68 ml  Output   5000 ml  Net -1520.32 ml     Filed Weights   11/24/12 0500 11/25/12 0500 11/26/12 0500  Weight: 197.315 kg (435 lb) 195.863 kg (431 lb 12.8 oz) 190.1 kg (419 lb 1.5 oz)    Exam:   Afebrile, vital signs are stable. FiO2 85%.  General: Intubated, sedated, appears calm  Eyes: Pupils equal, round, react to light, normal lids.  ENT: Lips appear unremarkable.  Cardiovascular: Regular rate and rhythm. No murmur, rub or gallop. Perhaps some decrease in bilateral lower extremity edema, 2+.  Respiratory: Clear to auscultation bilaterally. Fair air movement. No air trapping on ventilator waveform. No frank wheezes, rales or rhonchi.  Abdomen: Obese, soft.  Skin: No new rash or induration seen.  Neurologic: Cannot assess  Psychiatric: Cannot assess  Scheduled Meds: . ipratropium  0.5 mg Nebulization Q4H   And  . albuterol  2.5 mg Nebulization Q4H  . antiseptic oral rinse  15 mL Mouth Rinse QID  . aspirin  81 mg Oral Daily  . azithromycin  500 mg Intravenous Q24H  . cefTRIAXone (ROCEPHIN)  IV  1 g Intravenous Q24H  . chlorhexidine  15 mL Mouth Rinse BID  . enoxaparin (LOVENOX) injection  100 mg Subcutaneous Q24H  . feeding supplement (VITAL AF 1.2 CAL)  1,000 mL Per Tube Q24H  . methylPREDNISolone  (SOLU-MEDROL) injection  40 mg Intravenous Q12H  . multivitamin  5 mL Oral Daily  . nicotine  21 mg Transdermal Daily  . pantoprazole (PROTONIX) IV  40 mg Intravenous Daily  . potassium chloride  40 mEq Per Tube TID  . sodium chloride  10-40 mL Intracatheter Q12H  . sodium chloride  3 mL Intravenous Q12H  . vancomycin  1,250 mg Intravenous Q12H   Continuous Infusions: . sodium chloride 20 mL/hr at 11/26/12 0600  . fentaNYL infusion INTRAVENOUS 100 mcg/hr (11/26/12 0600)  . furosemide (LASIX) infusion 4 mg/hr (11/26/12 1142)  . propofol 30 mcg/kg/min (11/26/12 0945)    Principal Problem:   Acute respiratory failure with hypoxia Active Problems:   Volume overload   Bilateral lower extremity edema   HTN (hypertension)   Normocytic anemia   Tobacco dependence   Acute diastolic congestive heart failure   CAP (community acquired pneumonia)   Sepsis   Time spent 30 minutes

## 2012-11-26 NOTE — Progress Notes (Signed)
Subjective: She remains intubated on the ventilator. She has not diuresed a substantial amount since admission is negative only about 5 L. Her blood pressure has been somewhat marginal.  Objective: Vital signs in last 24 hours: Temp:  [97.3 F (36.3 C)-98.4 F (36.9 C)] 97.3 F (36.3 C) (11/06 0400) Pulse Rate:  [61-87] 67 (11/06 0645) Resp:  [15-20] 16 (11/06 0645) BP: (96-153)/(49-84) 103/53 mmHg (11/06 0645) SpO2:  [88 %-96 %] 94 % (11/06 0828) FiO2 (%):  [85 %] 85 % (11/06 0828) Weight:  [190.1 kg (419 lb 1.5 oz)] 190.1 kg (419 lb 1.5 oz) (11/06 0500) Weight change: -5.763 kg (-12 lb 11.3 oz) Last BM Date: 11/19/12  Intake/Output from previous day: 11/05 0701 - 11/06 0700 In: 3811.3 [I.V.:1716.9; ZO/XW:9604.5; IV Piggyback:1050] Out: 3750 [Urine:3750]  PHYSICAL EXAM General appearance: morbidly obese and Intubated sedated and still with excess fluid Resp: rhonchi bilaterally Cardio: regular rate and rhythm, S1, S2 normal, no murmur, click, rub or gallop GI: She still has some fluid in her abdomen  Lab Results:    Basic Metabolic Panel:  Recent Labs  40/98/11 0930 11/25/12 0430 11/26/12 0435  NA  --  140 143  K  --  3.8 4.1  CL  --  93* 97  CO2  --  41* 40*  GLUCOSE  --  152* 162*  BUN  --  15 23  CREATININE  --  0.83 0.81  CALCIUM  --  9.3 9.6  MG 1.9 2.1  --    Liver Function Tests:  Recent Labs  11/24/12 0448 11/25/12 0430  AST 23 27  ALT 11 15  ALKPHOS 68 71  BILITOT 0.6 0.4  PROT 6.6 6.7  ALBUMIN 2.7* 2.6*   No results found for this basename: LIPASE, AMYLASE,  in the last 72 hours No results found for this basename: AMMONIA,  in the last 72 hours CBC:  Recent Labs  11/26/12 0435  WBC 9.6  HGB 10.0*  HCT 36.2  MCV 78.9  PLT 286   Cardiac Enzymes: No results found for this basename: CKTOTAL, CKMB, CKMBINDEX, TROPONINI,  in the last 72 hours BNP: No results found for this basename: PROBNP,  in the last 72 hours D-Dimer: No results  found for this basename: DDIMER,  in the last 72 hours CBG: No results found for this basename: GLUCAP,  in the last 72 hours Hemoglobin A1C: No results found for this basename: HGBA1C,  in the last 72 hours Fasting Lipid Panel:  Recent Labs  11/24/12 0448  TRIG 129   Thyroid Function Tests: No results found for this basename: TSH, T4TOTAL, FREET4, T3FREE, THYROIDAB,  in the last 72 hours Anemia Panel: No results found for this basename: VITAMINB12, FOLATE, FERRITIN, TIBC, IRON, RETICCTPCT,  in the last 72 hours Coagulation: No results found for this basename: LABPROT, INR,  in the last 72 hours Urine Drug Screen: Drugs of Abuse  No results found for this basename: labopia, cocainscrnur, labbenz, amphetmu, thcu, labbarb    Alcohol Level: No results found for this basename: ETH,  in the last 72 hours Urinalysis: No results found for this basename: COLORURINE, APPERANCEUR, LABSPEC, PHURINE, GLUCOSEU, HGBUR, BILIRUBINUR, KETONESUR, PROTEINUR, UROBILINOGEN, NITRITE, LEUKOCYTESUR,  in the last 72 hours Misc. Labs:  ABGS  Recent Labs  11/25/12 0353 11/26/12 0338  PHART 7.420 7.450  PO2ART 60.7* 67.8*  TCO2 36.3  --   HCO3 39.3* 37.8*   CULTURES Recent Results (from the past 240 hour(s))  MRSA PCR SCREENING  Status: None   Collection Time    11/20/12  9:29 PM      Result Value Range Status   MRSA by PCR NEGATIVE  NEGATIVE Final   Comment:            The GeneXpert MRSA Assay (FDA     approved for NASAL specimens     only), is one component of a     comprehensive MRSA colonization     surveillance program. It is not     intended to diagnose MRSA     infection nor to guide or     monitor treatment for     MRSA infections.  CULTURE, RESPIRATORY (NON-EXPECTORATED)     Status: None   Collection Time    11/21/12  9:27 AM      Result Value Range Status   Specimen Description TRACHEAL ASPIRATE   Final   Special Requests NONE   Final   Gram Stain     Final   Value:  ABUNDANT WBC PRESENT, PREDOMINANTLY PMN     RARE SQUAMOUS EPITHELIAL CELLS PRESENT     MODERATE GRAM POSITIVE COCCI IN PAIRS AND CHAINS     Performed at [facility]   Culture     Final   Value: Non-Pathogenic Oropharyngeal-type Flora Isolated.     Performed at [facility]   Report Status 11/24/2012 FINAL   Final  CULTURE, BLOOD (ROUTINE X 2)     Status: None   Collection Time    11/21/12  9:30 AM      Result Value Range Status   Specimen Description BLOOD RIGHT HAND   Final   Special Requests BOTTLES DRAWN AEROBIC AND ANAEROBIC 8CC   Final   Culture NO GROWTH 4 DAYS   Final   Report Status PENDING   Incomplete  CULTURE, BLOOD (ROUTINE X 2)     Status: None   Collection Time    11/21/12  9:39 AM      Result Value Range Status   Specimen Description BLOOD LEFT HAND   Final   Special Requests BOTTLES DRAWN AEROBIC AND ANAEROBIC 8CC   Final   Culture NO GROWTH 4 DAYS   Final   Report Status PENDING   Incomplete   Studies/Results: Dg Chest Port 1 View  11/26/2012   CLINICAL DATA:  Acute respiratory failure with hypoxia.  EXAM: PORTABLE CHEST - 1 VIEW  COMPARISON:  11/25/2012  FINDINGS: Support lines and tubes in appropriate position. Low lung volumes again demonstrated. Diffuse interstitial infiltrates are stable as well as the left retrocardiac atelectasis or consolidation. Cardiomegaly is also stable. No pneumothorax identified.  IMPRESSION: No significant change compared with prior exam.   Electronically Signed   By: Myles Rosenthal M.D.   On: 11/26/2012 08:19   Dg Chest Port 1 View  11/25/2012   CLINICAL DATA:  Respiratory failure  EXAM: PORTABLE CHEST - 1 VIEW  COMPARISON:  November 24, 2012.  FINDINGS: The lungs remain hypo inflated. The esophagogastric tube tip projects off the film. An endotracheal tube is is felt to be present with the tip 1 cm below the inferior margin of the clavicular heads. A left PICC catheter is present with the tip in the region of the proximal  to mid SVC. The cardiac silhouette and aortic silhouette remains prominent and it margin is indistinct. The left hemidiaphragm remains obscured. Atelectatic change in the right lower lung is less conspicuous today.  IMPRESSION: 1. The retrocardiac region remains  quite dense consistent with atelectasis and or pneumonia. Pleural fluid is not excluded. 2. The interstitial markings in the right lung remain increased but some resolution of subsegmental atelectasis may be present. 3. The endotracheal tube tip appears to lie approximately 1 cm below the inferior margin of the clavicular head.   Electronically Signed   By: David  [place]   On: 11/25/2012 08:01   Dg Chest Port 1 View  11/24/2012   CLINICAL DATA:  Status post OG tube placement  EXAM: PORTABLE CHEST - 1 VIEW  COMPARISON:  None.  FINDINGS: The cardiac shadow is stable. The lungs are stable in appearance. AA gastric catheter is noted however the tip of the catheter is not visualized on this film. It can be traced at least to the level of the aortic knob. An abdomen film was recommended for further evaluation.   Electronically Signed   By: Alcide Clever M.D.   On: 11/24/2012 13:48    Medications:  Scheduled: . ipratropium  0.5 mg Nebulization Q4H   And  . albuterol  2.5 mg Nebulization Q4H  . antiseptic oral rinse  15 mL Mouth Rinse QID  . aspirin  81 mg Oral Daily  . azithromycin  500 mg Intravenous Q24H  . cefTRIAXone (ROCEPHIN)  IV  1 g Intravenous Q24H  . chlorhexidine  15 mL Mouth Rinse BID  . enoxaparin (LOVENOX) injection  100 mg Subcutaneous Q24H  . feeding supplement (VITAL AF 1.2 CAL)  1,000 mL Per Tube Q24H  . furosemide  60 mg Intravenous Q8H  . methylPREDNISolone (SOLU-MEDROL) injection  40 mg Intravenous Q12H  . multivitamin  5 mL Oral Daily  . nicotine  21 mg Transdermal Daily  . pantoprazole (PROTONIX) IV  40 mg Intravenous Daily  . potassium chloride  40 mEq Per Tube TID  . sodium chloride  10-40 mL Intracatheter Q12H  .  sodium chloride  3 mL Intravenous Q12H  . vancomycin  1,250 mg Intravenous Q12H   Continuous: . sodium chloride 20 mL/hr at 11/26/12 0600  . fentaNYL infusion INTRAVENOUS 100 mcg/hr (11/26/12 0600)  . propofol 20 mcg/kg/min (11/26/12 0600)   ZOX:WRUEAV chloride, acetaminophen, acetaminophen, fentaNYL, ondansetron (ZOFRAN) IV, sodium chloride, sodium chloride, sodium chloride, sodium chloride  Assesment: She has acute respiratory failure with hypoxia which is multifactorial. She does have community-acquired pneumonia. She has acute diastolic congestive heart failure and is still volume overloaded. She has a significant component of COPD, obesity hypoventilation, and sleep apnea. I think she is still volume overloaded. I do not think this represents ARDS at this point because she's not taking very high pressures to ventilate her but she is on high FiO2 and PEEP Principal Problem:   Acute respiratory failure with hypoxia Active Problems:   Volume overload   Bilateral lower extremity edema   HTN (hypertension)   Normocytic anemia   Tobacco dependence   Acute diastolic congestive heart failure   CAP (community acquired pneumonia)   Sepsis    Plan: I discussed her situation with cardiology consultants. I would like to see if they think they can help [place] with getting more substantial diuresis which I think will allow [place] to make some progress with her weaning    LOS: 7 days   Jerrik Housholder L 11/26/2012, 8:40 AM

## 2012-11-26 NOTE — Progress Notes (Signed)
ANTIBIOTIC CONSULT NOTE  Pharmacy Consult for Vancomycin Indication: pneumonia  Allergies  Allergen Reactions  . Augmentin [Amoxicillin-Pot Clavulanate] Other (See Comments)    NOT SURE/ IT'S BEEN AWHILE   Patient Measurements: Height: 5\' 7"  (170.2 cm) Weight: 419 lb 1.5 oz (190.1 kg) IBW/kg (Calculated) : 61.6  Vital Signs: Temp: 97.3 F (36.3 C) (11/06 0400) Temp src: Axillary (11/06 0400) BP: 103/53 mmHg (11/06 0645) Pulse Rate: 67 (11/06 0645) Intake/Output from previous day: 11/05 0701 - 11/06 0700 In: 3811.3 [I.V.:1716.9; NG/GT:1044.3; IV Piggyback:1050] Out: 3750 [Urine:3750] Intake/Output from this shift: Total I/O In: -  Out: 1450 [Urine:1450]  Labs:  Recent Labs  11/24/12 0448 11/25/12 0430 11/26/12 0435  WBC  --   --  9.6  HGB  --   --  10.0*  PLT  --   --  286  CREATININE 0.92 0.83 0.81   Estimated Creatinine Clearance: 143.3 ml/min (by C-G formula based on Cr of 0.81).  Recent Labs  11/23/12 1122  VANCOTROUGH 20.7*    Microbiology: Recent Results (from the past 720 hour(s))  MRSA PCR SCREENING     Status: None   Collection Time    11/20/12  9:29 PM      Result Value Range Status   MRSA by PCR NEGATIVE  NEGATIVE Final   Comment:            The GeneXpert MRSA Assay (FDA     approved for NASAL specimens     only), is one component of a     comprehensive MRSA colonization     surveillance program. It is not     intended to diagnose MRSA     infection nor to guide or     monitor treatment for     MRSA infections.  CULTURE, RESPIRATORY (NON-EXPECTORATED)     Status: None   Collection Time    11/21/12  9:27 AM      Result Value Range Status   Specimen Description TRACHEAL ASPIRATE   Final   Special Requests NONE   Final   Gram Stain     Final   Value: ABUNDANT WBC PRESENT, PREDOMINANTLY PMN     RARE SQUAMOUS EPITHELIAL CELLS PRESENT     MODERATE GRAM POSITIVE COCCI IN PAIRS AND CHAINS     Performed at [facility]   Culture      Final   Value: Non-Pathogenic Oropharyngeal-type Flora Isolated.     Performed at [facility]   Report Status 11/24/2012 FINAL   Final  CULTURE, BLOOD (ROUTINE X 2)     Status: None   Collection Time    11/21/12  9:30 AM      Result Value Range Status   Specimen Description BLOOD RIGHT HAND   Final   Special Requests BOTTLES DRAWN AEROBIC AND ANAEROBIC 8CC   Final   Culture NO GROWTH 4 DAYS   Final   Report Status PENDING   Incomplete  CULTURE, BLOOD (ROUTINE X 2)     Status: None   Collection Time    11/21/12  9:39 AM      Result Value Range Status   Specimen Description BLOOD LEFT HAND   Final   Special Requests BOTTLES DRAWN AEROBIC AND ANAEROBIC 8CC   Final   Culture NO GROWTH 4 DAYS   Final   Report Status PENDING   Incomplete   Medical History: Past Medical History  Diagnosis Date  . Hypertension   . GERD (gastroesophageal reflux  disease)    Medications:  Scheduled:  . ipratropium  0.5 mg Nebulization Q4H   And  . albuterol  2.5 mg Nebulization Q4H  . antiseptic oral rinse  15 mL Mouth Rinse QID  . aspirin  81 mg Oral Daily  . azithromycin  500 mg Intravenous Q24H  . cefTRIAXone (ROCEPHIN)  IV  1 g Intravenous Q24H  . chlorhexidine  15 mL Mouth Rinse BID  . enoxaparin (LOVENOX) injection  100 mg Subcutaneous Q24H  . feeding supplement (VITAL AF 1.2 CAL)  1,000 mL Per Tube Q24H  . furosemide  60 mg Intravenous Q8H  . methylPREDNISolone (SOLU-MEDROL) injection  40 mg Intravenous Q12H  . multivitamin  5 mL Oral Daily  . nicotine  21 mg Transdermal Daily  . pantoprazole (PROTONIX) IV  40 mg Intravenous Daily  . potassium chloride  40 mEq Per Tube TID  . sodium chloride  10-40 mL Intracatheter Q12H  . sodium chloride  3 mL Intravenous Q12H  . vancomycin  1,250 mg Intravenous Q12H   Assessment: 52yo female who is morbidly obese admitted with suspected pna.  She is currently on day#6 empiric antibiotics.  She has been slow to progress, however per MD notes  this appears to be related to volume status not PNA.   Pt has good renal fxn.    Goal of Therapy:  Vancomycin trough level 15-20 mcg/ml  Plan:  Vancomycin to 1250 mg IV every 12 hours Vancomycin trough level weekly Continue Rocephin 1 GM IV every 24 hr Continue Azithromycin 500 mg IV every 24 hours (change to PO when appropriate) Monitor labs, renal fxn, and cultures Duration of therapy per MD - if PNA resolved consider d/c antibiotics or streamline to Rocephin as a single agent.   Elson Clan 11/26/2012,9:18 AM

## 2012-11-26 NOTE — Progress Notes (Signed)
NUTRITION FOLLOW UP  Intervention:    Continuous Vital AF @ 20 ml/hr. This will provide 576 kcals, 36 grams protein, and 389 ml fluid daily at rate.   Add ProStat 30 ml BID via orogastric tube  With propofol at current rate, calories provided will be 1700  kcals and 66 grams of protein, which meets  100% of estimated calorie needs and 54%  minimum  estimated protein needs. Likely will be unable to meet protein goals at this time until pt able to decrease propofol rate.  Nutrition Dx:   Inadequate oral intake related to inability to eat as evidenced by NPO status.   Goal:   Enteral nutrition to provide 60-70% of estimated calorie needs (22-25 kcals/kg ideal body weight) and 100% of estimated protein needs, based on ASPEN guidelines for permissive underfeeding in critically ill obese individuals. Progressing.  Monitor:    Respiratory status, nutrition support measures, wt changes and labs  Assessment:  Pt enteral feeding resumed. She continues on ventilator support. Propofol rate decreased. Will add protein modular to better meet protein requirements.  Estimated needs re-assessed. No BM since 11/19/12. MV: 7.5 mL/min Temp:Temp (24hrs), Avg:98.2 F (36.8 C), Min:97.3 F (36.3 C), Max:98.4 F (36.9 C)  Propofol: 35 ml/hr provides caloric lipid load of 924 kcals q 24 hr at current rate.  Height: Ht Readings from Last 1 Encounters:  11/21/12 5\' 7"  (1.702 m)  IBW- 135# (61.3 kg)  Weight Status:   Wt Readings from Last 1 Encounters:  11/26/12 419 lb 1.5 oz (190.1 kg)    Re-estimated needs:  Kcal: 2822 (60-70%=1693-1975 kcal daily) Protein:122-134 gr  Fluid: per team goals  Skin: intact  Diet Order: NPO   Intake/Output Summary (Last 24 hours) at 11/26/12 1445 Last data filed at 11/26/12 1300  Gross per 24 hour  Intake 3795.9 ml  Output   5000 ml  Net -1204.1 ml  Net since admission 11/19/12 (-3.040 liters)   Last BM: 11/19/12   Labs:   Recent Labs Lab  11/23/12 0430 11/24/12 0448 11/24/12 0930 11/25/12 0430 11/26/12 0435  NA 144 141  --  140 143  K 2.5* 3.0*  --  3.8 4.1  CL 92* 92*  --  93* 97  CO2 43* 43*  --  41* 40*  BUN 11 12  --  15 23  CREATININE 0.95 0.92  --  0.83 0.81  CALCIUM 8.6 9.0  --  9.3 9.6  MG 1.8  --  1.9 2.1  --   GLUCOSE 119* 111*  --  152* 162*    CBG (last 3)  No results found for this basename: GLUCAP,  in the last 72 hours  Scheduled Meds: . ipratropium  0.5 mg Nebulization Q4H   And  . albuterol  2.5 mg Nebulization Q4H  . antiseptic oral rinse  15 mL Mouth Rinse QID  . aspirin  81 mg Oral Daily  . azithromycin  500 mg Intravenous Q24H  . cefTRIAXone (ROCEPHIN)  IV  1 g Intravenous Q24H  . chlorhexidine  15 mL Mouth Rinse BID  . enoxaparin (LOVENOX) injection  100 mg Subcutaneous Q24H  . feeding supplement (VITAL AF 1.2 CAL)  1,000 mL Per Tube Q24H  . methylPREDNISolone (SOLU-MEDROL) injection  40 mg Intravenous Q12H  . multivitamin  5 mL Oral Daily  . nicotine  21 mg Transdermal Daily  . pantoprazole (PROTONIX) IV  40 mg Intravenous Daily  . potassium chloride  40 mEq Per Tube TID  . sodium  chloride  10-40 mL Intracatheter Q12H  . sodium chloride  3 mL Intravenous Q12H  . vancomycin  1,250 mg Intravenous Q12H    Continuous Infusions: . sodium chloride 20 mL/hr at 11/26/12 0600  . fentaNYL infusion INTRAVENOUS 100 mcg/hr (11/26/12 0600)  . furosemide (LASIX) infusion 4 mg/hr (11/26/12 1300)  . propofol 30 mcg/kg/min (11/26/12 1300)    Royann Shivers MS,RD,CSG,LDN Office: [phone] Pager: [phone]

## 2012-11-26 NOTE — Progress Notes (Signed)
Consulting cardiologist: Purvis Sheffield MD  Subjective:    Intubated and sedated.   Objective:   Temp:  [97.3 F (36.3 C)-98.4 F (36.9 C)] 97.3 F (36.3 C) (11/06 0400) Pulse Rate:  [61-87] 67 (11/06 0645) Resp:  [15-20] 16 (11/06 0645) BP: (103-153)/(50-84) 103/53 mmHg (11/06 0645) SpO2:  [88 %-96 %] 94 % (11/06 0828) FiO2 (%):  [85 %] 85 % (11/06 0828) Weight:  [419 lb 1.5 oz (190.1 kg)] 419 lb 1.5 oz (190.1 kg) (11/06 0500) Last BM Date: 11/19/12  Filed Weights   11/24/12 0500 11/25/12 0500 11/26/12 0500  Weight: 435 lb (197.315 kg) 431 lb 12.8 oz (195.863 kg) 419 lb 1.5 oz (190.1 kg)    Intake/Output Summary (Last 24 hours) at 11/26/12 0946 Last data filed at 11/26/12 0900  Gross per 24 hour  Intake 3566.68 ml  Output   5200 ml  Net -1633.32 ml    Telemetry: NSR 70's.   Exam:  General: No acute distress.  HEENT: Conjunctiva and lids normal, oropharynx clear.  Lungs: Clear to auscultation, nonlabored.  Cardiac: No elevated JVP or bruits. RRR, no gallop or rub.   Abdomen: Normoactive bowel sounds, nontender, nondistended.  Extremities: No pitting edema, distal pulses full.  Neuropsychiatric: Alert and oriented x3, affect appropriate.   Lab Results:  Basic Metabolic Panel:  Recent Labs Lab 11/23/12 0430 11/24/12 0448 11/24/12 0930 11/25/12 0430 11/26/12 0435  NA 144 141  --  140 143  K 2.5* 3.0*  --  3.8 4.1  CL 92* 92*  --  93* 97  CO2 43* 43*  --  41* 40*  GLUCOSE 119* 111*  --  152* 162*  BUN 11 12  --  15 23  CREATININE 0.95 0.92  --  0.83 0.81  CALCIUM 8.6 9.0  --  9.3 9.6  MG 1.8  --  1.9 2.1  --     Liver Function Tests:  Recent Labs Lab 11/24/12 0448 11/25/12 0430  AST 23 27  ALT 11 15  ALKPHOS 68 71  BILITOT 0.6 0.4  PROT 6.6 6.7  ALBUMIN 2.7* 2.6*    CBC:  Recent Labs Lab 11/22/12 0451 11/23/12 0430 11/26/12 0435  WBC 10.5 10.3 9.6  HGB 10.1* 9.8* 10.0*  HCT 36.7 35.4* 36.2  MCV 77.9* 77.3* 78.9  PLT 293  276 286    Cardiac Enzymes:  Recent Labs Lab 11/19/12 1428  TROPONINI <0.30     Recent Labs  11/19/12 1428  PROBNP 3418.0*    Coagulation:  Recent Labs Lab 11/19/12 1428  INR 1.14    Radiology: Dg Chest Port 1 View  11/26/2012   CLINICAL DATA:  Acute respiratory failure with hypoxia.  EXAM: PORTABLE CHEST - 1 VIEW  COMPARISON:  11/25/2012  FINDINGS: Support lines and tubes in appropriate position. Low lung volumes again demonstrated. Diffuse interstitial infiltrates are stable as well as the left retrocardiac atelectasis or consolidation. Cardiomegaly is also stable. No pneumothorax identified.  IMPRESSION: No significant change compared with prior exam.   Electronically Signed   By: Myles Rosenthal M.D.   On: 11/26/2012 08:19   Dg Chest Port 1 View  11/25/2012   CLINICAL DATA:  Respiratory failure  EXAM: PORTABLE CHEST - 1 VIEW  COMPARISON:  November 24, 2012.  FINDINGS: The lungs remain hypo inflated. The esophagogastric tube tip projects off the film. An endotracheal tube is is felt to be present with the tip 1 cm below the inferior margin of the clavicular heads.  A left PICC catheter is present with the tip in the region of the proximal to mid SVC. The cardiac silhouette and aortic silhouette remains prominent and it margin is indistinct. The left hemidiaphragm remains obscured. Atelectatic change in the right lower lung is less conspicuous today.  IMPRESSION: 1. The retrocardiac region remains quite dense consistent with atelectasis and or pneumonia. Pleural fluid is not excluded. 2. The interstitial markings in the right lung remain increased but some resolution of subsegmental atelectasis may be present. 3. The endotracheal tube tip appears to lie approximately 1 cm below the inferior margin of the clavicular head.   Electronically Signed   By: David  [place]   On: 11/25/2012 08:01       Medications:   Scheduled Medications: . ipratropium  0.5 mg Nebulization Q4H   And  .  albuterol  2.5 mg Nebulization Q4H  . antiseptic oral rinse  15 mL Mouth Rinse QID  . aspirin  81 mg Oral Daily  . azithromycin  500 mg Intravenous Q24H  . cefTRIAXone (ROCEPHIN)  IV  1 g Intravenous Q24H  . chlorhexidine  15 mL Mouth Rinse BID  . enoxaparin (LOVENOX) injection  100 mg Subcutaneous Q24H  . feeding supplement (VITAL AF 1.2 CAL)  1,000 mL Per Tube Q24H  . furosemide  60 mg Intravenous Q8H  . methylPREDNISolone (SOLU-MEDROL) injection  40 mg Intravenous Q12H  . multivitamin  5 mL Oral Daily  . nicotine  21 mg Transdermal Daily  . pantoprazole (PROTONIX) IV  40 mg Intravenous Daily  . potassium chloride  40 mEq Per Tube TID  . sodium chloride  10-40 mL Intracatheter Q12H  . sodium chloride  3 mL Intravenous Q12H  . vancomycin  1,250 mg Intravenous Q12H     Infusions: . sodium chloride 20 mL/hr at 11/26/12 0600  . fentaNYL infusion INTRAVENOUS 100 mcg/hr (11/26/12 0600)  . propofol 30 mcg/kg/min (11/26/12 0945)     PRN Medications:  sodium chloride, acetaminophen, acetaminophen, fentaNYL, ondansetron (ZOFRAN) IV, sodium chloride, sodium chloride, sodium chloride, sodium chloride   Assessment and Plan:   1. Cor Pulmonale: She has had very little significant diureses over the last few days. She continues large amount of dependent edema. Will change to a lasix gtt and consider milronone gtt as well as this is preferred over dobutamine by studies. Creatinine stable. LV fx is normal per echo this month. Decreased incidence of ventricular arrhythmias.   2. Hypoventilation Syndrome with VDRF: She continues intubated and becoming difficult to wean. O2 sat's 92 %.. Continues on abx. Followed by Dr. Juanetta Gosling.  3. Hypotensive: BP low normal presently.  She is not on any antihypertensives presently.   4. Hypokalemia: Resolved.   Bettey Mare. Lyman Bishop NP [facility] 11/26/2012, 9:46 AM`

## 2012-11-26 NOTE — Progress Notes (Signed)
The patient was seen and examined, and I agree with the assessment and plan as documented above, with modifications as noted below. She continues to have dependent pitting pedal edema, but according to family members (husband and daughter), this has improved significantly. I examined her for the first time yesterday. While she has cor pulmonale and diastolic heart failure which is due to both RV dilatation and dysfunction (leading to interventricular septal shifting) as well as intrinsic left ventricular diastolic dysfunction, she is not in cardiogenic shock. For this reason, inotropes (milrinone, etc) are not indicated at this time. However, for a more optimal diuretic response, will initiate a lasix infusion at 4 mg/hr. However, we must monitor her hemodynamics carefully as patients with cor pulmonale are preload dependent, and over-diuresis can lead to a drop in cardiac output. Potassium levels will also have to be monitored carefully so that hypokalemia will not result in increased dysrhythmias. I've also asked nursing to concentrate her IV infusions such that she will have a significantly greater output than input. Will continue to monitor.

## 2012-11-27 ENCOUNTER — Inpatient Hospital Stay (HOSPITAL_COMMUNITY): Payer: BC Managed Care – PPO

## 2012-11-27 LAB — BLOOD GAS, ARTERIAL
Acid-Base Excess: 12.5 mmol/L — ABNORMAL HIGH (ref 0.0–2.0)
Bicarbonate: 37.3 mEq/L — ABNORMAL HIGH (ref 20.0–24.0)
Drawn by: 213101
FIO2: 85 %
O2 Saturation: 93.3 %
Patient temperature: 37
TCO2: 34.3 mmol/L (ref 0–100)
pCO2 arterial: 56.1 mmHg — ABNORMAL HIGH (ref 35.0–45.0)
pH, Arterial: 7.438 (ref 7.350–7.450)
pO2, Arterial: 71 mmHg — ABNORMAL LOW (ref 80.0–100.0)

## 2012-11-27 LAB — BASIC METABOLIC PANEL
BUN: 29 mg/dL — ABNORMAL HIGH (ref 6–23)
CO2: 38 mEq/L — ABNORMAL HIGH (ref 19–32)
Creatinine, Ser: 0.8 mg/dL (ref 0.50–1.10)
GFR calc Af Amer: >90 mL/min (ref >90)
GFR calc non Af Amer: 83 mL/min — ABNORMAL LOW (ref >90)
Glucose, Bld: 143 mg/dL — ABNORMAL HIGH (ref 70–99)
Potassium: 4.6 mEq/L (ref 3.5–5.1)
Sodium: 143 mEq/L (ref 135–145)

## 2012-11-27 LAB — TRIGLYCERIDES: Triglycerides: 140 mg/dL (ref <150)

## 2012-11-27 MED ORDER — PRO-STAT SUGAR FREE PO LIQD
30.0000 mL | Freq: Four times a day (QID) | ORAL | Status: DC
  Administered 2012-11-27 – 2012-12-01 (×15): 30 mL
  Filled 2012-11-27 (×15): qty 30

## 2012-11-27 MED ORDER — VITAL HIGH PROTEIN PO LIQD
1000.0000 mL | ORAL | Status: DC
  Administered 2012-11-29 (×2)
  Administered 2012-11-29: 1000 mL
  Administered 2012-11-29 (×3)
  Administered 2012-11-30 – 2012-12-01 (×3): 1000 mL
  Administered 2012-12-01 (×2)
  Filled 2012-11-27 (×7): qty 1000

## 2012-11-27 NOTE — Progress Notes (Signed)
NUTRITION FOLLOW UP  Intervention:    Change enteral formula and rate: Vital High Protein @ 10 ml/hr. This will provide 240 kcals, 21 grams protein, and 389 ml fluid q 24 hr.   Increase ProStat 30 ml QID via orogastric tube (provides 400 kcal, 60 gr protein).  Flush tube per adult enteral protocol  With propofol included, nutrition support provides 1880 kcals and 81 grams of protein, which meets 100% of estimated calorie needs and 66% minimum estimated protein needs.  Nutrition Dx:   Inadequate oral intake related to inability to eat as evidenced by NPO status.   Goal:   Enteral nutrition to provide 60-70% of estimated calorie needs (22-25 kcals/kg ideal body weight) and 100% of estimated protein needs, based on ASPEN guidelines for permissive underfeeding in critically ill obese individuals. Progressing.  Monitor:    Respiratory status, nutrition support measures, wt changes and labs  Assessment:  She continues on ventilator support. Propofol rate increased today. Will change enteral formula and increase protein modular to more adequately meet protein requirements.  Estimated needs re-assessed. No BM since 11/19/12. MV: 8 mL/min Temp:Temp (24hrs), Avg:98.3 F (36.8 C), Min:97.5 F (36.4 C), Max:98.9 F (37.2 C)  Propofol:  47 ml/hr provides caloric lipid load of 1240 kcals q 24 hr at current rate.  Height: Ht Readings from Last 1 Encounters:  11/21/12 5\' 7"  (1.702 m)  IBW- 135# (61.3 kg)  Weight Status:   Wt Readings from Last 1 Encounters:  11/27/12 422 lb 6.4 oz (191.6 kg)   Re-estimated needs:  Kcal: 2822 (60-70%=1693-1975 kcal daily) Protein:122-134 gr  Fluid: per team goals  Skin: intact  Diet Order: NPO   Intake/Output Summary (Last 24 hours) at 11/27/12 1549 Last data filed at 11/27/12 1400  Gross per 24 hour  Intake 2715.64 ml  Output   6250 ml  Net -3534.36 ml  Net since admission 11/19/12 (-9.383 liters)   Last BM: 11/19/12   Labs:   Recent  Labs Lab 11/23/12 0430  11/24/12 0930 11/25/12 0430 11/26/12 0435 11/27/12 0418  NA 144  < >  --  140 143 143  K 2.5*  < >  --  3.8 4.1 4.6  CL 92*  < >  --  93* 97 96  CO2 43*  < >  --  41* 40* 38*  BUN 11  < >  --  15 23 29*  CREATININE 0.95  < >  --  0.83 0.81 0.80  CALCIUM 8.6  < >  --  9.3 9.6 10.0  MG 1.8  --  1.9 2.1  --   --   GLUCOSE 119*  < >  --  152* 162* 143*  < > = values in this interval not displayed.  CBG (last 3)  No results found for this basename: GLUCAP,  in the last 72 hours  Scheduled Meds: . ipratropium  0.5 mg Nebulization Q4H   And  . albuterol  2.5 mg Nebulization Q4H  . antiseptic oral rinse  15 mL Mouth Rinse QID  . aspirin  81 mg Oral Daily  . chlorhexidine  15 mL Mouth Rinse BID  . enoxaparin (LOVENOX) injection  100 mg Subcutaneous Q24H  . feeding supplement (PRO-STAT SUGAR FREE 64)  30 mL Per Tube BID  . feeding supplement (VITAL AF 1.2 CAL)  1,000 mL Per Tube Q24H  . methylPREDNISolone (SOLU-MEDROL) injection  40 mg Intravenous Q12H  . multivitamin  5 mL Oral Daily  . nicotine  21  mg Transdermal Daily  . pantoprazole (PROTONIX) IV  40 mg Intravenous Daily  . potassium chloride  40 mEq Per Tube TID  . sodium chloride  10-40 mL Intracatheter Q12H  . sodium chloride  3 mL Intravenous Q12H    Continuous Infusions: . sodium chloride 20 mL/hr at 11/27/12 1504  . fentaNYL infusion INTRAVENOUS 150 mcg/hr (11/27/12 1400)  . furosemide (LASIX) infusion 4 mg/hr (11/27/12 1400)  . propofol 40 mcg/kg/min (11/27/12 1503)    Royann Shivers MS,RD,CSG,LDN Office: [phone] Pager: [phone]

## 2012-11-27 NOTE — Consult Note (Signed)
error 

## 2012-11-27 NOTE — Progress Notes (Signed)
[facility] PROGRESS NOTE  Meghan Hoover NUU:[id number] DOB: 15-Feb-1960 DOA: 11/19/2012 PCP: Kirk Ruths, MD  Summary: 52 year old woman presented to the emergency department with increasing shortness of breath. Initial evaluation was notable for hypoxic respiratory failure, massive volume overload from feet up to abdomen suggesting CHF.  Assessment/Plan: 1. VDRF, acute respiratory acidosis: No significant change. I suspect the primary driving component is acute diastolic heart failure complicated by suspected obesity hypoventilation syndrome, suspected sleep apnea, suspected COPD. I do not think pneumonia is playing a large role at this point. Dr. Juanetta Gosling is weaning down oxygen as tolerated. 2. Community acquired pneumonia: Remains afebrile, hemodynamically stable. Lactic acid was normal. Sputum culture was unremarkable. WBC count is normal. I do not feel that this is playing a significant role in her respiratory failure.  Antibiotics discontinued. 3. Acute diastolic congestive heart failure with anasarca: Complicated by pulmonary hypertension, suspected cor pulmonale, long-term smoking. Creatinine preserved. Started on lasix infusion per cardiology with good diuresis overnight.  Will continue with current treatments and try to wean down oxygen as tolerated.. 4. Anemia of critical illness: Overall appears stable. Transfuse for hemoglobin less than 7. 5. Hypokalemia: improved. 6. Hypertension: Stable. 7. Morbid obesity 8. Tobacco dependence: Patient has no desire to quit.  Code Status: full code DVT prophylaxis: Lovenox Family Communication: no family present Disposition Plan: home when improved  Erick Blinks, MD  [facility]  Pager [phone] If 7PM-7AM, please contact night-coverage at [email or web address], password [facility] 11/27/2012, 9:57 AM  LOS: 8 days   Consultants:  Pulmonology  Cardiology  Procedures:  2-D echocardiogram: LVEF 65-70%; Grade 2 diastolic  dysfunction. Moderate pulmonary HTN, possible cor pulmonale.  11/1: PICC line placement  Intubation 11/1  HPI/Subjective: Patient remains intubated and sedated on ventilator.  Objective: Filed Vitals:   11/27/12 0600 11/27/12 0700 11/27/12 0730 11/27/12 0800  BP: 121/66 116/63 115/63 113/65  Pulse: 63 64  60  Temp:   97.5 F (36.4 C)   TempSrc:   Axillary   Resp: 16 16  16   Height:      Weight:      SpO2: 94% 93%  94%    Intake/Output Summary (Last 24 hours) at 11/27/12 0957 Last data filed at 11/27/12 0800  Gross per 24 hour  Intake 2753.86 ml  Output   4250 ml  Net -1496.14 ml     Filed Weights   11/25/12 0500 11/26/12 0500 11/27/12 0500  Weight: 195.863 kg (431 lb 12.8 oz) 190.1 kg (419 lb 1.5 oz) 191.6 kg (422 lb 6.4 oz)    Exam:   Afebrile, vital signs are stable. FiO2 70%.  General: Intubated, sedated, appears calm  Eyes: Pupils equal, round, react to light, normal lids.  ENT: Lips appear unremarkable.  Cardiovascular: Regular rate and rhythm. No murmur, rub or gallop. Perhaps some decrease in bilateral lower extremity edema, 2+.  Respiratory: Clear to auscultation bilaterally. Fair air movement. No air trapping on ventilator waveform. No frank wheezes, rales or rhonchi.  Abdomen: Obese, soft.  Skin: No new rash or induration seen.  Neurologic: Cannot assess  Psychiatric: Cannot assess  Scheduled Meds: . ipratropium  0.5 mg Nebulization Q4H   And  . albuterol  2.5 mg Nebulization Q4H  . antiseptic oral rinse  15 mL Mouth Rinse QID  . aspirin  81 mg Oral Daily  . azithromycin  500 mg Intravenous Q24H  . cefTRIAXone (ROCEPHIN)  IV  1 g Intravenous Q24H  . chlorhexidine  15 mL Mouth Rinse BID  .  enoxaparin (LOVENOX) injection  100 mg Subcutaneous Q24H  . feeding supplement (PRO-STAT SUGAR FREE 64)  30 mL Per Tube BID  . feeding supplement (VITAL AF 1.2 CAL)  1,000 mL Per Tube Q24H  . methylPREDNISolone (SOLU-MEDROL) injection  40 mg  Intravenous Q12H  . multivitamin  5 mL Oral Daily  . nicotine  21 mg Transdermal Daily  . pantoprazole (PROTONIX) IV  40 mg Intravenous Daily  . potassium chloride  40 mEq Per Tube TID  . sodium chloride  10-40 mL Intracatheter Q12H  . sodium chloride  3 mL Intravenous Q12H  . vancomycin  1,250 mg Intravenous Q12H   Continuous Infusions: . sodium chloride 20 mL/hr at 11/26/12 0600  . fentaNYL infusion INTRAVENOUS 30 mcg/hr (11/27/12 0926)  . furosemide (LASIX) infusion 4 mg/hr (11/27/12 0800)  . propofol 15 mcg/kg/min (11/27/12 0800)    Principal Problem:   Acute respiratory failure with hypoxia Active Problems:   Volume overload   Bilateral lower extremity edema   HTN (hypertension)   Normocytic anemia   Tobacco dependence   Acute diastolic congestive heart failure   CAP (community acquired pneumonia)   Sepsis   Time spent 30 minutes

## 2012-11-27 NOTE — Progress Notes (Signed)
Consulting cardiologist: Purvis Sheffield MD  Subjective:    Intubated and sedated.   Objective:   Temp:  [97.5 F (36.4 C)-98.9 F (37.2 C)] 97.5 F (36.4 C) (11/07 0730) Pulse Rate:  [58-81] 60 (11/07 0800) Resp:  [14-17] 16 (11/07 0800) BP: (98-138)/(50-81) 113/65 mmHg (11/07 0800) SpO2:  [89 %-96 %] 94 % (11/07 0800) FiO2 (%):  [70 %-85 %] 70 % (11/07 0800) Weight:  [422 lb 6.4 oz (191.6 kg)] 422 lb 6.4 oz (191.6 kg) (11/07 0500) Last BM Date: 11/19/12  Filed Weights   11/25/12 0500 11/26/12 0500 11/27/12 0500  Weight: 431 lb 12.8 oz (195.863 kg) 419 lb 1.5 oz (190.1 kg) 422 lb 6.4 oz (191.6 kg)    Intake/Output Summary (Last 24 hours) at 11/27/12 0806 Last data filed at 11/27/12 0800  Gross per 24 hour  Intake 2696.96 ml  Output   5700 ml  Net -3003.04 ml    Telemetry: NSR sinus brady 60's  Exam:  General: No acute distress.  HEENT: Conjunctiva and lids normal, oropharynx clear.  Lungs: Clear to auscultation, anterior, without wheezes or rhonchi. Remains intubated.  Cardiac: No elevated JVP or bruits. Neck obese, difficult to assess. RRR, no gallop or rub.   Abdomen: Normoactive bowel sounds, nontender, nondistended.  Extremities: 1+-2+ pitting edema, distal pulses full.  Neuropsychiatric: Sedated. Unable to assess.  Lab Results:  Basic Metabolic Panel:  Recent Labs Lab 11/23/12 0430  11/24/12 0930 11/25/12 0430 11/26/12 0435 11/27/12 0418  NA 144  < >  --  140 143 143  K 2.5*  < >  --  3.8 4.1 4.6  CL 92*  < >  --  93* 97 96  CO2 43*  < >  --  41* 40* 38*  GLUCOSE 119*  < >  --  152* 162* 143*  BUN 11  < >  --  15 23 29*  CREATININE 0.95  < >  --  0.83 0.81 0.80  CALCIUM 8.6  < >  --  9.3 9.6 10.0  MG 1.8  --  1.9 2.1  --   --   < > = values in this interval not displayed.  Liver Function Tests:  Recent Labs Lab 11/24/12 0448 11/25/12 0430  AST 23 27  ALT 11 15  ALKPHOS 68 71  BILITOT 0.6 0.4  PROT 6.6 6.7  ALBUMIN 2.7* 2.6*     CBC:  Recent Labs Lab 11/22/12 0451 11/23/12 0430 11/26/12 0435  WBC 10.5 10.3 9.6  HGB 10.1* 9.8* 10.0*  HCT 36.7 35.4* 36.2  MCV 77.9* 77.3* 78.9  PLT 293 276 286    BNP:  Recent Labs  11/19/12 1428  PROBNP 3418.0*    Radiology: Dg Chest Port 1 View  11/27/2012   CLINICAL DATA:  Respiratory failure.  EXAM: PORTABLE CHEST - 1 VIEW  COMPARISON:  11/26/2012.  FINDINGS: Exam is under penetrated due to body habitus and portable technique. Left subclavian central line remains present with the tip not visualized due to technique. There appears to be an endotracheal tube and enteric tube which are poorly visualized. Lower lung volumes with increasing perihilar airspace disease. Opacification of the left hemithorax has increased, apparently Due to airspace disease and atelectasis.  IMPRESSION: 1. Technically suboptimal exam due to underpenetration. 2. Lower lung volumes and worsening aeration. 3. Grossly stable support apparatus.   Electronically Signed   By: Andreas Newport M.D.   On: 11/27/2012 07:39   Dg Chest [facility]  11/26/2012   CLINICAL DATA:  Acute respiratory failure with hypoxia.  EXAM: PORTABLE CHEST - 1 VIEW  COMPARISON:  11/25/2012  FINDINGS: Support lines and tubes in appropriate position. Low lung volumes again demonstrated. Diffuse interstitial infiltrates are stable as well as the left retrocardiac atelectasis or consolidation. Cardiomegaly is also stable. No pneumothorax identified.  IMPRESSION: No significant change compared with prior exam.   Electronically Signed   By: Myles Rosenthal M.D.   On: 11/26/2012 08:19     Medications:   Scheduled Medications: . ipratropium  0.5 mg Nebulization Q4H   And  . albuterol  2.5 mg Nebulization Q4H  . antiseptic oral rinse  15 mL Mouth Rinse QID  . aspirin  81 mg Oral Daily  . azithromycin  500 mg Intravenous Q24H  . cefTRIAXone (ROCEPHIN)  IV  1 g Intravenous Q24H  . chlorhexidine  15 mL Mouth Rinse BID  . enoxaparin  (LOVENOX) injection  100 mg Subcutaneous Q24H  . feeding supplement (PRO-STAT SUGAR FREE 64)  30 mL Per Tube BID  . feeding supplement (VITAL AF 1.2 CAL)  1,000 mL Per Tube Q24H  . methylPREDNISolone (SOLU-MEDROL) injection  40 mg Intravenous Q12H  . multivitamin  5 mL Oral Daily  . nicotine  21 mg Transdermal Daily  . pantoprazole (PROTONIX) IV  40 mg Intravenous Daily  . potassium chloride  40 mEq Per Tube TID  . sodium chloride  10-40 mL Intracatheter Q12H  . sodium chloride  3 mL Intravenous Q12H  . vancomycin  1,250 mg Intravenous Q12H     Infusions: . sodium chloride 20 mL/hr at 11/26/12 0600  . fentaNYL infusion INTRAVENOUS 75 mcg/hr (11/27/12 0800)  . furosemide (LASIX) infusion 4 mg/hr (11/27/12 0800)  . propofol 30 mcg/kg/min (11/27/12 0630)     PRN Medications:  sodium chloride, acetaminophen, acetaminophen, fentaNYL, ondansetron (ZOFRAN) IV, sodium chloride, sodium chloride, sodium chloride, sodium chloride   Assessment and Plan:   1. Acute on Chronic Cor [place]: Now on lasix gtt at 4 mg hour. She has diuresed 3 liters overnight. She continues significant dependent edema, sacral edema. Heart rate is controled to bradycardic without AV nodal blocking agents. She is hemodynamically stable at present.  2. VDRF: Management per Dr. Juanetta Gosling. Slow weaning parameters in place.  3. Hypotension: Stable.   Meghan Hoover. Lyman Bishop NP [facility] 11/27/2012, 8:06 AM

## 2012-11-27 NOTE — Progress Notes (Signed)
The patient was seen and examined, and I agree with the assessment and plan as documented above, with modifications as noted below.  She continues to have dependent pitting pedal edema, but this has improved significantly after having nearly 3 liters of urine output, and oxygenation also appears to have improved. Her BUN is up to 29 from 23, although her CO2 is down to 38 from 40. Will need to monitor her renal function carefully so as not to over-diurese. We must continue to monitor her hemodynamics carefully as patients with cor pulmonale are preload dependent, and over-diuresis can lead to a drop in cardiac output. Potassium levels will also have to be monitored carefully so that hypokalemia will not result in increased dysrhythmias, and today her K is normal.  Continue present dosing of Lasix infusion at 4 mg/hr for now.

## 2012-11-27 NOTE — Progress Notes (Signed)
Subjective: She remains intubated and on the ventilator. She was able to diurese almost 3 L yesterday.  Objective: Vital signs in last 24 hours: Temp:  [97.5 F (36.4 C)-98.9 F (37.2 C)] 97.5 F (36.4 C) (11/07 0730) Pulse Rate:  [58-81] 60 (11/07 0800) Resp:  [14-17] 16 (11/07 0800) BP: (98-138)/(50-81) 113/65 mmHg (11/07 0800) SpO2:  [89 %-96 %] 94 % (11/07 0800) FiO2 (%):  [70 %-85 %] 70 % (11/07 0800) Weight:  [191.6 kg (422 lb 6.4 oz)] 191.6 kg (422 lb 6.4 oz) (11/07 0500) Weight change: 1.5 kg (3 lb 4.9 oz) Last BM Date: 11/19/12  Intake/Output from previous day: 11/06 0701 - 11/07 0700 In: 2685.8 [I.V.:1335.8; NG/GT:500; IV Piggyback:850] Out: 4850 [Urine:4850]  PHYSICAL EXAM General appearance: alert, moderate distress and morbidly obese Resp: rhonchi bilaterally Cardio: regular rate and rhythm, S1, S2 normal, no murmur, click, rub or gallop GI: She still has some abdominal fluid Extremities: Her edema is better. She has a leg wound on her left leg. She has chronic venous stasis  Lab Results:    Basic Metabolic Panel:  Recent Labs  16/10/96 0930  11/25/12 0430 11/26/12 0435 11/27/12 0418  NA  --   < > 140 143 143  K  --   < > 3.8 4.1 4.6  CL  --   < > 93* 97 96  CO2  --   < > 41* 40* 38*  GLUCOSE  --   < > 152* 162* 143*  BUN  --   < > 15 23 29*  CREATININE  --   < > 0.83 0.81 0.80  CALCIUM  --   < > 9.3 9.6 10.0  MG 1.9  --  2.1  --   --   < > = values in this interval not displayed. Liver Function Tests:  Recent Labs  11/25/12 0430  AST 27  ALT 15  ALKPHOS 71  BILITOT 0.4  PROT 6.7  ALBUMIN 2.6*   No results found for this basename: LIPASE, AMYLASE,  in the last 72 hours No results found for this basename: AMMONIA,  in the last 72 hours CBC:  Recent Labs  11/26/12 0435  WBC 9.6  HGB 10.0*  HCT 36.2  MCV 78.9  PLT 286   Cardiac Enzymes: No results found for this basename: CKTOTAL, CKMB, CKMBINDEX, TROPONINI,  in the last 72  hours BNP: No results found for this basename: PROBNP,  in the last 72 hours D-Dimer: No results found for this basename: DDIMER,  in the last 72 hours CBG: No results found for this basename: GLUCAP,  in the last 72 hours Hemoglobin A1C: No results found for this basename: HGBA1C,  in the last 72 hours Fasting Lipid Panel:  Recent Labs  11/27/12 0417  TRIG 140   Thyroid Function Tests: No results found for this basename: TSH, T4TOTAL, FREET4, T3FREE, THYROIDAB,  in the last 72 hours Anemia Panel: No results found for this basename: VITAMINB12, FOLATE, FERRITIN, TIBC, IRON, RETICCTPCT,  in the last 72 hours Coagulation: No results found for this basename: LABPROT, INR,  in the last 72 hours Urine Drug Screen: Drugs of Abuse  No results found for this basename: labopia, cocainscrnur, labbenz, amphetmu, thcu, labbarb    Alcohol Level: No results found for this basename: ETH,  in the last 72 hours Urinalysis: No results found for this basename: COLORURINE, APPERANCEUR, LABSPEC, PHURINE, GLUCOSEU, HGBUR, BILIRUBINUR, KETONESUR, PROTEINUR, UROBILINOGEN, NITRITE, LEUKOCYTESUR,  in the last 72 hours Misc.  Labs:  ABGS  Recent Labs  11/27/12 0450  PHART 7.438  PO2ART 71.0*  TCO2 34.3  HCO3 37.3*   CULTURES Recent Results (from the past 240 hour(s))  MRSA PCR SCREENING     Status: None   Collection Time    11/20/12  9:29 PM      Result Value Range Status   MRSA by PCR NEGATIVE  NEGATIVE Final   Comment:            The GeneXpert MRSA Assay (FDA     approved for NASAL specimens     only), is one component of a     comprehensive MRSA colonization     surveillance program. It is not     intended to diagnose MRSA     infection nor to guide or     monitor treatment for     MRSA infections.  CULTURE, RESPIRATORY (NON-EXPECTORATED)     Status: None   Collection Time    11/21/12  9:27 AM      Result Value Range Status   Specimen Description TRACHEAL ASPIRATE   Final    Special Requests NONE   Final   Gram Stain     Final   Value: ABUNDANT WBC PRESENT, PREDOMINANTLY PMN     RARE SQUAMOUS EPITHELIAL CELLS PRESENT     MODERATE GRAM POSITIVE COCCI IN PAIRS AND CHAINS     Performed at [facility]   Culture     Final   Value: Non-Pathogenic Oropharyngeal-type Flora Isolated.     Performed at [facility]   Report Status 11/24/2012 FINAL   Final  CULTURE, BLOOD (ROUTINE X 2)     Status: None   Collection Time    11/21/12  9:30 AM      Result Value Range Status   Specimen Description BLOOD RIGHT HAND   Final   Special Requests BOTTLES DRAWN AEROBIC AND ANAEROBIC 8CC   Final   Culture NO GROWTH 5 DAYS   Final   Report Status 11/26/2012 FINAL   Final  CULTURE, BLOOD (ROUTINE X 2)     Status: None   Collection Time    11/21/12  9:39 AM      Result Value Range Status   Specimen Description BLOOD LEFT HAND   Final   Special Requests BOTTLES DRAWN AEROBIC AND ANAEROBIC 8CC   Final   Culture NO GROWTH 5 DAYS   Final   Report Status 11/26/2012 FINAL   Final   Studies/Results: Dg Chest Port 1 View  11/27/2012   CLINICAL DATA:  Respiratory failure.  EXAM: PORTABLE CHEST - 1 VIEW  COMPARISON:  11/26/2012.  FINDINGS: Exam is under penetrated due to body habitus and portable technique. Left subclavian central line remains present with the tip not visualized due to technique. There appears to be an endotracheal tube and enteric tube which are poorly visualized. Lower lung volumes with increasing perihilar airspace disease. Opacification of the left hemithorax has increased, apparently Due to airspace disease and atelectasis.  IMPRESSION: 1. Technically suboptimal exam due to underpenetration. 2. Lower lung volumes and worsening aeration. 3. Grossly stable support apparatus.   Electronically Signed   By: Andreas Newport M.D.   On: 11/27/2012 07:39   Dg Chest Port 1 View  11/26/2012   CLINICAL DATA:  Acute respiratory failure with hypoxia.  EXAM: PORTABLE  CHEST - 1 VIEW  COMPARISON:  11/25/2012  FINDINGS: Support lines and tubes in appropriate position. Low lung volumes  again demonstrated. Diffuse interstitial infiltrates are stable as well as the left retrocardiac atelectasis or consolidation. Cardiomegaly is also stable. No pneumothorax identified.  IMPRESSION: No significant change compared with prior exam.   Electronically Signed   By: Myles Rosenthal M.D.   On: 11/26/2012 08:19    Medications:  Scheduled: . ipratropium  0.5 mg Nebulization Q4H   And  . albuterol  2.5 mg Nebulization Q4H  . antiseptic oral rinse  15 mL Mouth Rinse QID  . aspirin  81 mg Oral Daily  . azithromycin  500 mg Intravenous Q24H  . cefTRIAXone (ROCEPHIN)  IV  1 g Intravenous Q24H  . chlorhexidine  15 mL Mouth Rinse BID  . enoxaparin (LOVENOX) injection  100 mg Subcutaneous Q24H  . feeding supplement (PRO-STAT SUGAR FREE 64)  30 mL Per Tube BID  . feeding supplement (VITAL AF 1.2 CAL)  1,000 mL Per Tube Q24H  . methylPREDNISolone (SOLU-MEDROL) injection  40 mg Intravenous Q12H  . multivitamin  5 mL Oral Daily  . nicotine  21 mg Transdermal Daily  . pantoprazole (PROTONIX) IV  40 mg Intravenous Daily  . potassium chloride  40 mEq Per Tube TID  . sodium chloride  10-40 mL Intracatheter Q12H  . sodium chloride  3 mL Intravenous Q12H  . vancomycin  1,250 mg Intravenous Q12H   Continuous: . sodium chloride 20 mL/hr at 11/26/12 0600  . fentaNYL infusion INTRAVENOUS 75 mcg/hr (11/27/12 0800)  . furosemide (LASIX) infusion 4 mg/hr (11/27/12 0800)  . propofol 15 mcg/kg/min (11/27/12 0800)   OZH:YQMVHQ chloride, acetaminophen, acetaminophen, fentaNYL, ondansetron (ZOFRAN) IV, sodium chloride, sodium chloride, sodium chloride, sodium chloride  Assesment: She has acute respiratory failure which is ventilator dependent. She has required very high oxygen concentration. She does have bilateral infiltrates that look like pulmonary edema. I do not think she has ARDS still  because she's not taking high ventilator pressures. Her oxygenation is better now that she's diuresed more. Her chest x-ray is interpreted as being worse but I don't think it's really interpretable because of her body habitus Principal Problem:   Acute respiratory failure with hypoxia Active Problems:   Volume overload   Bilateral lower extremity edema   HTN (hypertension)   Normocytic anemia   Tobacco dependence   Acute diastolic congestive heart failure   CAP (community acquired pneumonia)   Sepsis    Plan: I'm going to decrease her FiO2 today. Continue efforts at diuresis.    LOS: 8 days   Stephan Nelis L 11/27/2012, 8:26 AM

## 2012-11-28 ENCOUNTER — Inpatient Hospital Stay (HOSPITAL_COMMUNITY): Payer: BC Managed Care – PPO

## 2012-11-28 LAB — BASIC METABOLIC PANEL
CO2: 38 mEq/L — ABNORMAL HIGH (ref 19–32)
Calcium: 10.1 mg/dL (ref 8.4–10.5)
Creatinine, Ser: 0.77 mg/dL (ref 0.50–1.10)
GFR calc Af Amer: >90 mL/min (ref >90)
GFR calc non Af Amer: >90 mL/min (ref >90)
Sodium: 141 mEq/L (ref 135–145)

## 2012-11-28 LAB — BLOOD GAS, ARTERIAL
FIO2: 0.75 %
MECHVT: 500 mL
O2 Saturation: 89.3 %
PEEP: 8 cmH2O
Patient temperature: 37
TCO2: 35.6 mmol/L (ref 0–100)
pCO2 arterial: 61 mmHg (ref 35.0–45.0)
pH, Arterial: 7.419 (ref 7.350–7.450)

## 2012-11-28 MED ORDER — FUROSEMIDE 10 MG/ML IJ SOLN
INTRAMUSCULAR | Status: AC
  Filled 2012-11-28: qty 30

## 2012-11-28 MED ORDER — FENTANYL CITRATE 0.05 MG/ML IJ SOLN
INTRAMUSCULAR | Status: AC
  Filled 2012-11-28: qty 50

## 2012-11-28 MED ORDER — INSULIN ASPART 100 UNIT/ML ~~LOC~~ SOLN
0.0000 [IU] | SUBCUTANEOUS | Status: DC
  Administered 2012-11-29 – 2012-12-01 (×10): 2 [IU] via SUBCUTANEOUS

## 2012-11-28 MED ORDER — POLYETHYLENE GLYCOL 3350 17 G PO PACK
17.0000 g | PACK | Freq: Every day | ORAL | Status: DC
  Administered 2012-11-28 – 2012-11-30 (×3): 17 g
  Filled 2012-11-28 (×3): qty 1

## 2012-11-28 NOTE — Progress Notes (Signed)
[facility] PROGRESS NOTE  Meghan Hoover ZOX:[id number] DOB: November 03, 1960 DOA: 11/19/2012 PCP: Kirk Ruths, MD  Summary: 52 year old woman presented to the emergency department with increasing shortness of breath. Initial evaluation was notable for hypoxic respiratory failure, massive volume overload from feet up to abdomen suggesting CHF.  Assessment/Plan: 1. VDRF, acute respiratory acidosis: No significant change. I suspect the primary driving component is acute diastolic heart failure complicated by suspected obesity hypoventilation syndrome, suspected sleep apnea, suspected COPD. I do not think pneumonia is playing a large role at this point. Dr. Juanetta Gosling is weaning down oxygen as tolerated. 2. Community acquired pneumonia: Remains afebrile, hemodynamically stable. Lactic acid was normal. Sputum culture was unremarkable. WBC count is normal. I do not feel that this is playing a significant role in her respiratory failure.  Antibiotics discontinued. 3. Acute diastolic congestive heart failure with anasarca: Complicated by pulmonary hypertension, suspected cor pulmonale, long-term smoking. Creatinine preserved. Started on lasix infusion per cardiology with excellent diuresis overnight.  Will continue with current treatments and try to wean down oxygen as tolerated. 4. Anemia of critical illness: Overall appears stable. Transfuse for hemoglobin less than 7. 5. Hypokalemia: improved. 6. Hypertension: Stable. 7. Morbid obesity 8. Tobacco dependence: Patient has no desire to quit.  Code Status: full code DVT prophylaxis: Lovenox Family Communication: discussed with husband [name] Disposition Plan: home when improved  Erick Blinks, MD  [facility]  Pager [phone] If 7PM-7AM, please contact night-coverage at [email or web address], password [facility] 11/28/2012, 9:54 AM  LOS: 9 days   Consultants:  Pulmonology  Cardiology  Procedures:  2-D echocardiogram: LVEF 65-70%; Grade 2  diastolic dysfunction. Moderate pulmonary HTN, possible cor pulmonale.  11/1: PICC line placement  Intubation 11/1  HPI/Subjective: Patient remains intubated and sedated on ventilator.  Objective: Filed Vitals:   11/28/12 0758 11/28/12 0800 11/28/12 0829 11/28/12 0834  BP:  92/47    Pulse:  66    Temp: 98.5 F (36.9 C)     TempSrc: Axillary     Resp:  16    Height:      Weight:      SpO2:  94% 95% 93%    Intake/Output Summary (Last 24 hours) at 11/28/12 0954 Last data filed at 11/28/12 0700  Gross per 24 hour  Intake 2899.56 ml  Output   7050 ml  Net -4150.44 ml     Filed Weights   11/26/12 0500 11/27/12 0500 11/28/12 0317  Weight: 190.1 kg (419 lb 1.5 oz) 191.6 kg (422 lb 6.4 oz) 190.511 kg (420 lb)    Exam:   Afebrile, vital signs are stable. FiO2 70%.  General: Intubated, sedated, appears calm  Eyes: Pupils equal, round, react to light, normal lids.  ENT: Lips appear unremarkable.  Cardiovascular: Regular rate and rhythm. No murmur, rub or gallop. Perhaps some decrease in bilateral lower extremity edema, 1+.  Respiratory: Clear to auscultation bilaterally. Fair air movement. No air trapping on ventilator waveform. No frank wheezes, rales or rhonchi.  Abdomen: Obese, soft.  Skin: No new rash or induration seen.  Neurologic: Cannot assess  Psychiatric: Cannot assess  Scheduled Meds: . ipratropium  0.5 mg Nebulization Q4H   And  . albuterol  2.5 mg Nebulization Q4H  . antiseptic oral rinse  15 mL Mouth Rinse QID  . aspirin  81 mg Oral Daily  . chlorhexidine  15 mL Mouth Rinse BID  . enoxaparin (LOVENOX) injection  100 mg Subcutaneous Q24H  . feeding supplement (PRO-STAT SUGAR FREE 64)  30 mL  Per Tube QID  . feeding supplement (VITAL AF 1.2 CAL)  1,000 mL Per Tube Q24H  . feeding supplement (VITAL HIGH PROTEIN)  1,000 mL Per Tube Q24H  . methylPREDNISolone (SOLU-MEDROL) injection  40 mg Intravenous Q12H  . multivitamin  5 mL Oral Daily  .  nicotine  21 mg Transdermal Daily  . pantoprazole (PROTONIX) IV  40 mg Intravenous Daily  . potassium chloride  40 mEq Per Tube TID  . sodium chloride  10-40 mL Intracatheter Q12H  . sodium chloride  3 mL Intravenous Q12H   Continuous Infusions: . sodium chloride 20 mL/hr at 11/28/12 0700  . fentaNYL infusion INTRAVENOUS 75 mcg/hr (11/28/12 0700)  . furosemide (LASIX) infusion 4 mg/hr (11/28/12 0111)  . propofol 30 mcg/kg/min (11/28/12 0700)    Principal Problem:   Acute respiratory failure with hypoxia Active Problems:   Volume overload   Bilateral lower extremity edema   HTN (hypertension)   Normocytic anemia   Tobacco dependence   Acute diastolic congestive heart failure   CAP (community acquired pneumonia)   Sepsis   Time spent 30 minutes

## 2012-11-28 NOTE — Progress Notes (Signed)
Did recruitment after neb treatment , increased peep to 10 for low sats

## 2012-11-28 NOTE — Progress Notes (Signed)
Subjective: She continues to have significant diuresis. Her FiO2 has been decreased to 70% and she has tolerated that thus far. No other new issues are noted  Objective: Vital signs in last 24 hours: Temp:  [98.2 F (36.8 C)-98.5 F (36.9 C)] 98.5 F (36.9 C) (11/08 0758) Pulse Rate:  [58-80] 66 (11/08 0800) Resp:  [14-18] 16 (11/08 0800) BP: (91-166)/(40-91) 92/47 mmHg (11/08 0800) SpO2:  [87 %-95 %] 93 % (11/08 0834) FiO2 (%):  [70 %-75 %] 70 % (11/08 0834) Weight:  [190.511 kg (420 lb)] 190.511 kg (420 lb) (11/08 0317) Weight change: -1.089 kg (-2 lb 6.4 oz) Last BM Date: 11/19/12  Intake/Output from previous day: 11/07 0701 - 11/08 0700 In: 3025.3 [I.V.:2486.9; NG/GT:488.3; IV Piggyback:50] Out: 7900 [Urine:7900]  PHYSICAL EXAM General appearance: alert, mild distress, morbidly obese and She is undergoing wakeup assessment now and is moving all 4 extremities and responds with nodding her head Resp: diminished breath sounds bilaterally Cardio: regular rate and rhythm, S1, S2 normal, no murmur, click, rub or gallop GI: I cannot definitely detect an intra-abdominal fluid now Extremities: Her edema is much less. She has chronic venous stasis changes. The wound on her left leg looks better  Lab Results:    Basic Metabolic Panel:  Recent Labs  16/10/96 0418 11/28/12 0447  NA 143 141  K 4.6 4.6  CL 96 93*  CO2 38* 38*  GLUCOSE 143* 134*  BUN 29* 34*  CREATININE 0.80 0.77  CALCIUM 10.0 10.1   Liver Function Tests: No results found for this basename: AST, ALT, ALKPHOS, BILITOT, PROT, ALBUMIN,  in the last 72 hours No results found for this basename: LIPASE, AMYLASE,  in the last 72 hours No results found for this basename: AMMONIA,  in the last 72 hours CBC:  Recent Labs  11/26/12 0435  WBC 9.6  HGB 10.0*  HCT 36.2  MCV 78.9  PLT 286   Cardiac Enzymes: No results found for this basename: CKTOTAL, CKMB, CKMBINDEX, TROPONINI,  in the last 72 hours BNP: No  results found for this basename: PROBNP,  in the last 72 hours D-Dimer: No results found for this basename: DDIMER,  in the last 72 hours CBG: No results found for this basename: GLUCAP,  in the last 72 hours Hemoglobin A1C: No results found for this basename: HGBA1C,  in the last 72 hours Fasting Lipid Panel:  Recent Labs  11/27/12 0417  TRIG 140   Thyroid Function Tests: No results found for this basename: TSH, T4TOTAL, FREET4, T3FREE, THYROIDAB,  in the last 72 hours Anemia Panel: No results found for this basename: VITAMINB12, FOLATE, FERRITIN, TIBC, IRON, RETICCTPCT,  in the last 72 hours Coagulation: No results found for this basename: LABPROT, INR,  in the last 72 hours Urine Drug Screen: Drugs of Abuse  No results found for this basename: labopia, cocainscrnur, labbenz, amphetmu, thcu, labbarb    Alcohol Level: No results found for this basename: ETH,  in the last 72 hours Urinalysis: No results found for this basename: COLORURINE, APPERANCEUR, LABSPEC, PHURINE, GLUCOSEU, HGBUR, BILIRUBINUR, KETONESUR, PROTEINUR, UROBILINOGEN, NITRITE, LEUKOCYTESUR,  in the last 72 hours Misc. Labs:  ABGS  Recent Labs  11/28/12 0500  PHART 7.419  PO2ART 62.6*  TCO2 35.6  HCO3 38.7*   CULTURES Recent Results (from the past 240 hour(s))  MRSA PCR SCREENING     Status: None   Collection Time    11/20/12  9:29 PM      Result Value Range Status  MRSA by PCR NEGATIVE  NEGATIVE Final   Comment:            The GeneXpert MRSA Assay (FDA     approved for NASAL specimens     only), is one component of a     comprehensive MRSA colonization     surveillance program. It is not     intended to diagnose MRSA     infection nor to guide or     monitor treatment for     MRSA infections.  CULTURE, RESPIRATORY (NON-EXPECTORATED)     Status: None   Collection Time    11/21/12  9:27 AM      Result Value Range Status   Specimen Description TRACHEAL ASPIRATE   Final   Special Requests  NONE   Final   Gram Stain     Final   Value: ABUNDANT WBC PRESENT, PREDOMINANTLY PMN     RARE SQUAMOUS EPITHELIAL CELLS PRESENT     MODERATE GRAM POSITIVE COCCI IN PAIRS AND CHAINS     Performed at [facility]   Culture     Final   Value: Non-Pathogenic Oropharyngeal-type Flora Isolated.     Performed at [facility]   Report Status 11/24/2012 FINAL   Final  CULTURE, BLOOD (ROUTINE X 2)     Status: None   Collection Time    11/21/12  9:30 AM      Result Value Range Status   Specimen Description BLOOD RIGHT HAND   Final   Special Requests BOTTLES DRAWN AEROBIC AND ANAEROBIC 8CC   Final   Culture NO GROWTH 5 DAYS   Final   Report Status 11/26/2012 FINAL   Final  CULTURE, BLOOD (ROUTINE X 2)     Status: None   Collection Time    11/21/12  9:39 AM      Result Value Range Status   Specimen Description BLOOD LEFT HAND   Final   Special Requests BOTTLES DRAWN AEROBIC AND ANAEROBIC 8CC   Final   Culture NO GROWTH 5 DAYS   Final   Report Status 11/26/2012 FINAL   Final   Studies/Results: Dg Chest Port 1 View  11/27/2012   CLINICAL DATA:  Respiratory failure.  EXAM: PORTABLE CHEST - 1 VIEW  COMPARISON:  11/26/2012.  FINDINGS: Exam is under penetrated due to body habitus and portable technique. Left subclavian central line remains present with the tip not visualized due to technique. There appears to be an endotracheal tube and enteric tube which are poorly visualized. Lower lung volumes with increasing perihilar airspace disease. Opacification of the left hemithorax has increased, apparently Due to airspace disease and atelectasis.  IMPRESSION: 1. Technically suboptimal exam due to underpenetration. 2. Lower lung volumes and worsening aeration. 3. Grossly stable support apparatus.   Electronically Signed   By: Andreas Newport M.D.   On: 11/27/2012 07:39    Medications:  Scheduled: . ipratropium  0.5 mg Nebulization Q4H   And  . albuterol  2.5 mg Nebulization Q4H  .  antiseptic oral rinse  15 mL Mouth Rinse QID  . aspirin  81 mg Oral Daily  . chlorhexidine  15 mL Mouth Rinse BID  . enoxaparin (LOVENOX) injection  100 mg Subcutaneous Q24H  . feeding supplement (PRO-STAT SUGAR FREE 64)  30 mL Per Tube QID  . feeding supplement (VITAL AF 1.2 CAL)  1,000 mL Per Tube Q24H  . feeding supplement (VITAL HIGH PROTEIN)  1,000 mL Per Tube Q24H  .  methylPREDNISolone (SOLU-MEDROL) injection  40 mg Intravenous Q12H  . multivitamin  5 mL Oral Daily  . nicotine  21 mg Transdermal Daily  . pantoprazole (PROTONIX) IV  40 mg Intravenous Daily  . potassium chloride  40 mEq Per Tube TID  . sodium chloride  10-40 mL Intracatheter Q12H  . sodium chloride  3 mL Intravenous Q12H   Continuous: . sodium chloride 20 mL/hr at 11/28/12 0700  . fentaNYL infusion INTRAVENOUS 75 mcg/hr (11/28/12 0700)  . furosemide (LASIX) infusion 4 mg/hr (11/28/12 0111)  . propofol 30 mcg/kg/min (11/28/12 0700)   ZOX:WRUEAV chloride, acetaminophen, acetaminophen, fentaNYL, ondansetron (ZOFRAN) IV, sodium chloride, sodium chloride, sodium chloride, sodium chloride  Assesment: She has acute ventilator dependent respiratory failure. This is multifactorial and I think includes community-acquired pneumonia, acute diastolic CHF, COPD which have not been formally diagnosed previously, morbid obesity with obesity hypoventilation and sleep apnea. Principal Problem:   Acute respiratory failure with hypoxia Active Problems:   Volume overload   Bilateral lower extremity edema   HTN (hypertension)   Normocytic anemia   Tobacco dependence   Acute diastolic congestive heart failure   CAP (community acquired pneumonia)   Sepsis    Plan: Continue efforts at diuresis. I think if this continues she will improve and we will be able to continue reducing FiO2.    LOS: 9 days   Meghan Hoover 11/28/2012, 9:57 AM

## 2012-11-29 ENCOUNTER — Inpatient Hospital Stay (HOSPITAL_COMMUNITY): Payer: BC Managed Care – PPO

## 2012-11-29 LAB — BLOOD GAS, ARTERIAL
Acid-Base Excess: 14 mmol/L — ABNORMAL HIGH (ref 0.0–2.0)
Bicarbonate: 38.3 mEq/L — ABNORMAL HIGH (ref 20.0–24.0)
FIO2: 0.75 %
O2 Saturation: 90.3 %
RATE: 16 resp/min
TCO2: 34.4 mmol/L (ref 0–100)
pCO2 arterial: 49.4 mmHg — ABNORMAL HIGH (ref 35.0–45.0)
pO2, Arterial: 61 mmHg — ABNORMAL LOW (ref 80.0–100.0)

## 2012-11-29 LAB — BASIC METABOLIC PANEL
BUN: 41 mg/dL — ABNORMAL HIGH (ref 6–23)
CO2: 40 mEq/L (ref 19–32)
Chloride: 89 mEq/L — ABNORMAL LOW (ref 96–112)
GFR calc non Af Amer: 83 mL/min — ABNORMAL LOW (ref >90)
Glucose, Bld: 127 mg/dL — ABNORMAL HIGH (ref 70–99)
Potassium: 4 mEq/L (ref 3.5–5.1)
Sodium: 136 mEq/L (ref 135–145)

## 2012-11-29 NOTE — Progress Notes (Signed)
Did recruitment maneuver, followed by neb treatment increased vt to 550 earlier trying to recruit lungs.

## 2012-11-29 NOTE — Progress Notes (Addendum)
Increased peep to 10 and tidal volume to 550 and doing recruit maneuvers before neb txs. Hoping to increase SATS so oxygen could be turned down. PT's is positional as to oxygen with rt side up her oxygen seems to be slightly better, left side up PTS SAT's seem to be lower. We lost ground tonight at bath and turn had to increase FiO2. Hopefully this is minor setback. Left lung is troublesome with xray. Blood gas showing over ventilation 7.5 PH , PCO2 49, PO2 holding at 61 -- FiO2 75.

## 2012-11-29 NOTE — Progress Notes (Signed)
Subjective: Events of last night are noted this morning she is sedated intubated and on the ventilator. Her oxygenation is okay but on very high FiO2  Objective: Vital signs in last 24 hours: Temp:  [98.4 F (36.9 C)-99.1 F (37.3 C)] 98.4 F (36.9 C) (11/09 0500) Pulse Rate:  [55-73] 57 (11/09 1000) Resp:  [10-17] 17 (11/09 1000) BP: (95-159)/(51-83) 109/55 mmHg (11/09 1000) SpO2:  [86 %-94 %] 88 % (11/09 1000) FiO2 (%):  [70 %-75 %] 75 % (11/09 0826) Weight:  [174 kg (383 lb 9.6 oz)-187.1 kg (412 lb 7.7 oz)] 174 kg (383 lb 9.6 oz) (11/09 0200) Weight change: -3.411 kg (-7 lb 8.3 oz) Last BM Date: 11/29/12  Intake/Output from previous day: 11/08 0701 - 11/09 0700 In: 2134.1 [I.V.:1774.1; NG/GT:360] Out: 7826 [Urine:7825; Stool:1]  PHYSICAL EXAM General appearance: morbidly obese and Sedated and intubated Resp: diminished breath sounds bilaterally Cardio: regular rate and rhythm, S1, S2 normal, no murmur, click, rub or gallop GI: soft, non-tender; bowel sounds normal; no masses,  no organomegaly Extremities: She has less edema and does show chronic venous stasis  Lab Results:    Basic Metabolic Panel:  Recent Labs  16/10/96 0447 11/29/12 0511  NA 141 136  K 4.6 4.0  CL 93* 89*  CO2 38* 40*  GLUCOSE 134* 127*  BUN 34* 41*  CREATININE 0.77 0.80  CALCIUM 10.1 10.6*   Liver Function Tests: No results found for this basename: AST, ALT, ALKPHOS, BILITOT, PROT, ALBUMIN,  in the last 72 hours No results found for this basename: LIPASE, AMYLASE,  in the last 72 hours No results found for this basename: AMMONIA,  in the last 72 hours CBC: No results found for this basename: WBC, NEUTROABS, HGB, HCT, MCV, PLT,  in the last 72 hours Cardiac Enzymes: No results found for this basename: CKTOTAL, CKMB, CKMBINDEX, TROPONINI,  in the last 72 hours BNP: No results found for this basename: PROBNP,  in the last 72 hours D-Dimer: No results found for this basename: DDIMER,  in  the last 72 hours CBG: No results found for this basename: GLUCAP,  in the last 72 hours Hemoglobin A1C: No results found for this basename: HGBA1C,  in the last 72 hours Fasting Lipid Panel:  Recent Labs  11/27/12 0417  TRIG 140   Thyroid Function Tests: No results found for this basename: TSH, T4TOTAL, FREET4, T3FREE, THYROIDAB,  in the last 72 hours Anemia Panel: No results found for this basename: VITAMINB12, FOLATE, FERRITIN, TIBC, IRON, RETICCTPCT,  in the last 72 hours Coagulation: No results found for this basename: LABPROT, INR,  in the last 72 hours Urine Drug Screen: Drugs of Abuse  No results found for this basename: labopia, cocainscrnur, labbenz, amphetmu, thcu, labbarb    Alcohol Level: No results found for this basename: ETH,  in the last 72 hours Urinalysis: No results found for this basename: COLORURINE, APPERANCEUR, LABSPEC, PHURINE, GLUCOSEU, HGBUR, BILIRUBINUR, KETONESUR, PROTEINUR, UROBILINOGEN, NITRITE, LEUKOCYTESUR,  in the last 72 hours Misc. Labs:  ABGS  Recent Labs  11/29/12 0548  PHART 7.502*  PO2ART 61.0*  TCO2 34.4  HCO3 38.3*   CULTURES Recent Results (from the past 240 hour(s))  MRSA PCR SCREENING     Status: None   Collection Time    11/20/12  9:29 PM      Result Value Range Status   MRSA by PCR NEGATIVE  NEGATIVE Final   Comment:  The GeneXpert MRSA Assay (FDA     approved for NASAL specimens     only), is one component of a     comprehensive MRSA colonization     surveillance program. It is not     intended to diagnose MRSA     infection nor to guide or     monitor treatment for     MRSA infections.  CULTURE, RESPIRATORY (NON-EXPECTORATED)     Status: None   Collection Time    11/21/12  9:27 AM      Result Value Range Status   Specimen Description TRACHEAL ASPIRATE   Final   Special Requests NONE   Final   Gram Stain     Final   Value: ABUNDANT WBC PRESENT, PREDOMINANTLY PMN     RARE SQUAMOUS EPITHELIAL CELLS  PRESENT     MODERATE GRAM POSITIVE COCCI IN PAIRS AND CHAINS     Performed at [facility]   Culture     Final   Value: Non-Pathogenic Oropharyngeal-type Flora Isolated.     Performed at [facility]   Report Status 11/24/2012 FINAL   Final  CULTURE, BLOOD (ROUTINE X 2)     Status: None   Collection Time    11/21/12  9:30 AM      Result Value Range Status   Specimen Description BLOOD RIGHT HAND   Final   Special Requests BOTTLES DRAWN AEROBIC AND ANAEROBIC 8CC   Final   Culture NO GROWTH 5 DAYS   Final   Report Status 11/26/2012 FINAL   Final  CULTURE, BLOOD (ROUTINE X 2)     Status: None   Collection Time    11/21/12  9:39 AM      Result Value Range Status   Specimen Description BLOOD LEFT HAND   Final   Special Requests BOTTLES DRAWN AEROBIC AND ANAEROBIC 8CC   Final   Culture NO GROWTH 5 DAYS   Final   Report Status 11/26/2012 FINAL   Final   Studies/Results: Dg Chest Port 1 View  11/29/2012   CLINICAL DATA:  Respiratory failure.  EXAM: PORTABLE CHEST - 1 VIEW  COMPARISON:  11/28/2012  FINDINGS: Endotracheal tube has tip 3.5 cm above the carinal. Nasogastric tube courses into the region of the stomach as tip is not definitely visualized. Left-sided PICC line has tip over the region of the SVC at the level of the carinal. The lungs are somewhat hypoinflated with continued opacification over the left base/ retrocardiac region. There is subtle prominence of the perihilar markings suggesting mild vascular congestion with slight interval improvement. Minimal linear atelectasis right midlung. Stable cardiomegaly. Remainder of the exam is unchanged.  IMPRESSION: Mild perihilar opacification improved likely minimal residual vascular congestion. Persistent left base opacification likely effusions/atelectasis although cannot exclude infection.  Stable cardiomegaly.  Tubes and lines as described.   Electronically Signed   By: Elberta Fortis M.D.   On: 11/29/2012 07:57   Dg Chest  Port 1 View  11/28/2012   CLINICAL DATA:  Respiratory failure  EXAM: PORTABLE CHEST - 1 VIEW  COMPARISON:  11/27/2012  FINDINGS: Suboptimal evaluation with under penetration due to body habitus and portable technique.  Endotracheal tube is poorly visualized but likely terminates 4.3 cm above the carina. Enteric tube is poorly visualized but likely courses below the diaphragm. Left subclavian venous catheter is poorly visualized but likely terminates in the lower SVC.  Increased interstitial markings, more prominently involving the right upper lobe, possibly reflecting infection  or asymmetric pulmonary edema. This appearance grossly unchanged.  Poor visualization of the retrocardiac region. Possible small left pleural effusion. No pneumothorax. .  IMPRESSION: Suboptimal evaluation.  Endotracheal tube is poorly visualized but likely terminates 4.3 cm above the carina. Additional support apparatus are grossly unchanged.  Increased interstitial markings, more prominently involving the right upper lobe, possibly reflecting infection or asymmetric pulmonary edema, grossly unchanged.  Poor visualization of the retrocardiac region. Possible small left pleural effusion.   Electronically Signed   By: Charline Bills M.D.   On: 11/28/2012 10:22    Medications:  Scheduled: . ipratropium  0.5 mg Nebulization Q4H   And  . albuterol  2.5 mg Nebulization Q4H  . antiseptic oral rinse  15 mL Mouth Rinse QID  . aspirin  81 mg Oral Daily  . chlorhexidine  15 mL Mouth Rinse BID  . enoxaparin (LOVENOX) injection  100 mg Subcutaneous Q24H  . feeding supplement (PRO-STAT SUGAR FREE 64)  30 mL Per Tube QID  . feeding supplement (VITAL HIGH PROTEIN)  1,000 mL Per Tube Q24H  . insulin aspart  0-15 Units Subcutaneous Q4H  . methylPREDNISolone (SOLU-MEDROL) injection  40 mg Intravenous Q12H  . multivitamin  5 mL Oral Daily  . nicotine  21 mg Transdermal Daily  . pantoprazole (PROTONIX) IV  40 mg Intravenous Daily  .  polyethylene glycol  17 g Per Tube Daily  . potassium chloride  40 mEq Per Tube TID  . sodium chloride  10-40 mL Intracatheter Q12H  . sodium chloride  3 mL Intravenous Q12H   Continuous: . sodium chloride 20 mL/hr at 11/29/12 0900  . fentaNYL infusion INTRAVENOUS 75 mcg/hr (11/29/12 0900)  . furosemide (LASIX) infusion 4 mg/hr (11/29/12 0600)  . propofol 25 mcg/kg/min (11/29/12 0813)   NFA:OZHYQM chloride, acetaminophen, acetaminophen, fentaNYL, ondansetron (ZOFRAN) IV, sodium chloride, sodium chloride, sodium chloride, sodium chloride  Assesment: She is admitted with acute respiratory failure. This is a combination of multiple factors I think including community acquired pneumonia, acute diastolic congestive heart failure, COPD and morbid obesity with obesity hypoventilation and sleep apnea. She is improving as far as her heart failure and has diuresed significantly. Her pneumonia is being treated. She is still requiring very high FiO2 Principal Problem:   Acute respiratory failure with hypoxia Active Problems:   Volume overload   Bilateral lower extremity edema   HTN (hypertension)   Normocytic anemia   Tobacco dependence   Acute diastolic congestive heart failure   CAP (community acquired pneumonia)   Sepsis    Plan: Continue efforts at reducing oxygen. Continue diuresis.    LOS: 10 days   Owen Pratte L 11/29/2012, 10:16 AM

## 2012-11-29 NOTE — Progress Notes (Signed)
[facility] PROGRESS NOTE  Meghan Hoover FAO:[id number] DOB: [birth year] DOA: 11/19/2012 PCP: Kirk Ruths, MD  Summary: 52 year old woman presented to the emergency department with increasing shortness of breath. Initial evaluation was notable for hypoxic respiratory failure, massive volume overload from feet up to abdomen suggesting CHF.  Assessment/Plan: 1. VDRF, acute respiratory acidosis: No significant change. I suspect the primary driving component is acute diastolic heart failure complicated by suspected obesity hypoventilation syndrome, suspected sleep apnea, suspected COPD. She has been treated for pneumonia with a course of antibiotics. Dr. Juanetta Gosling is weaning down oxygen as tolerated. 2. Community acquired pneumonia: Remains afebrile, hemodynamically stable. Lactic acid was normal. Sputum culture was unremarkable. WBC count is normal. She has completed a course of antibiotics. 3. Acute diastolic congestive heart failure with anasarca: Complicated by pulmonary hypertension, suspected cor pulmonale, long-term smoking. Creatinine preserved. Started on lasix infusion per cardiology with excellent diuresis overnight.  Will continue with current treatments and try to wean down oxygen as tolerated. 4. Anemia of critical illness: Overall appears stable. Transfuse for hemoglobin less than 7. 5. Hypokalemia: improved. 6. Hypertension: Stable. 7. Morbid obesity 8. Tobacco dependence: Patient has no desire to quit.  Code Status: full code DVT prophylaxis: Lovenox Family Communication: discussed with husband [name] Disposition Plan: home when improved  Erick Blinks, MD  [facility]  Pager [phone] If 7PM-7AM, please contact night-coverage at [email or web address], password [facility] 11/29/2012, 11:09 AM  LOS: 10 days   Consultants:  Pulmonology  Cardiology  Procedures:  2-D echocardiogram: LVEF 65-70%; Grade 2 diastolic dysfunction. Moderate pulmonary HTN, possible cor  pulmonale.  11/1: PICC line placement  Intubation 11/1  HPI/Subjective: Patient remains intubated and sedated on ventilator.  Objective: Filed Vitals:   11/29/12 0800 11/29/12 0826 11/29/12 0900 11/29/12 1000  BP: 122/68  131/71 109/55  Pulse: 55  56 57  Temp:      TempSrc:      Resp: 16  17 17   Height:      Weight:      SpO2: 90% 93% 86% 88%    Intake/Output Summary (Last 24 hours) at 11/29/12 1109 Last data filed at 11/29/12 0900  Gross per 24 hour  Intake 1903.39 ml  Output   6276 ml  Net -4372.61 ml     Filed Weights   11/28/12 0317 11/28/12 1400 11/29/12 0200  Weight: 190.511 kg (420 lb) 187.1 kg (412 lb 7.7 oz) 174 kg (383 lb 9.6 oz)    Exam:   Afebrile, vital signs are stable. FiO2 80%.  General: Intubated, sedated, appears calm  Eyes: Pupils equal, round, react to light, normal lids.  ENT: Lips appear unremarkable.  Cardiovascular: Regular rate and rhythm. No murmur, rub or gallop. Some decrease in bilateral lower extremity edema, 1+.  Respiratory: Clear to auscultation bilaterally. Fair air movement. No air trapping on ventilator waveform. No frank wheezes, rales or rhonchi.  Abdomen: Obese, soft.  Skin: No new rash or induration seen.  Neurologic: Cannot assess  Psychiatric: Cannot assess  Scheduled Meds: . ipratropium  0.5 mg Nebulization Q4H   And  . albuterol  2.5 mg Nebulization Q4H  . antiseptic oral rinse  15 mL Mouth Rinse QID  . aspirin  81 mg Oral Daily  . chlorhexidine  15 mL Mouth Rinse BID  . enoxaparin (LOVENOX) injection  100 mg Subcutaneous Q24H  . feeding supplement (PRO-STAT SUGAR FREE 64)  30 mL Per Tube QID  . feeding supplement (VITAL HIGH PROTEIN)  1,000 mL Per Tube Q24H  .  insulin aspart  0-15 Units Subcutaneous Q4H  . methylPREDNISolone (SOLU-MEDROL) injection  40 mg Intravenous Q12H  . multivitamin  5 mL Oral Daily  . nicotine  21 mg Transdermal Daily  . pantoprazole (PROTONIX) IV  40 mg Intravenous Daily  .  polyethylene glycol  17 g Per Tube Daily  . potassium chloride  40 mEq Per Tube TID  . sodium chloride  10-40 mL Intracatheter Q12H  . sodium chloride  3 mL Intravenous Q12H   Continuous Infusions: . sodium chloride 20 mL/hr at 11/29/12 0900  . fentaNYL infusion INTRAVENOUS 75 mcg/hr (11/29/12 0900)  . furosemide (LASIX) infusion 4 mg/hr (11/29/12 0600)  . propofol 40 mcg/kg/min (11/29/12 1055)    Principal Problem:   Acute respiratory failure with hypoxia Active Problems:   Volume overload   Bilateral lower extremity edema   HTN (hypertension)   Normocytic anemia   Tobacco dependence   Acute diastolic congestive heart failure   CAP (community acquired pneumonia)   Sepsis   Time spent 30 minutes

## 2012-11-29 NOTE — Progress Notes (Signed)
Tried recruitment maneuvers before neb treatment, PT still showing little to no change after. Peak pressures still low 35, plateau 26 . Not ARDS pattern.

## 2012-11-30 ENCOUNTER — Inpatient Hospital Stay (HOSPITAL_COMMUNITY): Payer: BC Managed Care – PPO

## 2012-11-30 LAB — BLOOD GAS, ARTERIAL
Bicarbonate: 35.8 mEq/L — ABNORMAL HIGH (ref 20.0–24.0)
FIO2: 0.8 %
MECHVT: 550 mL
O2 Saturation: 92.3 %
PEEP: 10 cmH2O
Patient temperature: 37
pCO2 arterial: 46.8 mmHg — ABNORMAL HIGH (ref 35.0–45.0)
pH, Arterial: 7.495 — ABNORMAL HIGH (ref 7.350–7.450)

## 2012-11-30 LAB — GLUCOSE, CAPILLARY
Glucose-Capillary: 115 mg/dL — ABNORMAL HIGH (ref 70–99)
Glucose-Capillary: 120 mg/dL — ABNORMAL HIGH (ref 70–99)
Glucose-Capillary: 123 mg/dL — ABNORMAL HIGH (ref 70–99)
Glucose-Capillary: 125 mg/dL — ABNORMAL HIGH (ref 70–99)
Glucose-Capillary: 130 mg/dL — ABNORMAL HIGH (ref 70–99)
Glucose-Capillary: 131 mg/dL — ABNORMAL HIGH (ref 70–99)
Glucose-Capillary: 135 mg/dL — ABNORMAL HIGH (ref 70–99)
Glucose-Capillary: 143 mg/dL — ABNORMAL HIGH (ref 70–99)
Glucose-Capillary: 124 mg/dL — ABNORMAL HIGH (ref 70–99)
Glucose-Capillary: 105 mg/dL — ABNORMAL HIGH (ref 70–99)
Glucose-Capillary: 123 mg/dL — ABNORMAL HIGH (ref 70–99)

## 2012-11-30 LAB — BASIC METABOLIC PANEL
BUN: 47 mg/dL — ABNORMAL HIGH (ref 6–23)
Calcium: 10.5 mg/dL (ref 8.4–10.5)
Chloride: 92 mEq/L — ABNORMAL LOW (ref 96–112)
Creatinine, Ser: 0.88 mg/dL (ref 0.50–1.10)
GFR calc Af Amer: 85 mL/min — ABNORMAL LOW (ref >90)
GFR calc non Af Amer: 74 mL/min — ABNORMAL LOW (ref >90)
Glucose, Bld: 123 mg/dL — ABNORMAL HIGH (ref 70–99)
Potassium: 4.2 mEq/L (ref 3.5–5.1)
Sodium: 140 mEq/L (ref 135–145)

## 2012-11-30 MED ORDER — VANCOMYCIN HCL 10 G IV SOLR
1250.0000 mg | Freq: Two times a day (BID) | INTRAVENOUS | Status: DC
  Administered 2012-12-01 – 2012-12-02 (×3): 1250 mg via INTRAVENOUS
  Filled 2012-11-30 (×5): qty 1250

## 2012-11-30 MED ORDER — VANCOMYCIN HCL 10 G IV SOLR
2000.0000 mg | INTRAVENOUS | Status: AC
  Administered 2012-11-30: 2000 mg via INTRAVENOUS
  Filled 2012-11-30: qty 2000

## 2012-11-30 MED ORDER — PIPERACILLIN-TAZOBACTAM 3.375 G IVPB
3.3750 g | Freq: Three times a day (TID) | INTRAVENOUS | Status: DC
  Administered 2012-11-30 – 2012-12-03 (×9): 3.375 g via INTRAVENOUS
  Filled 2012-11-30 (×12): qty 50

## 2012-11-30 NOTE — Progress Notes (Signed)
PT seems to be improving as of this note. Titrate Oxygen down still doing recruit maneuvers at neb and vent checks. Pressures still low, lung compliance good. Hopefully no set backs will occur. Recommend stay the course. Lungs x-ray clearing.

## 2012-11-30 NOTE — Progress Notes (Signed)
Patient spiked T 102.3 A. No diarrhea noted per primary RN. Dr. Kerry Hough notified and orders given.

## 2012-11-30 NOTE — Progress Notes (Signed)
[facility] PROGRESS NOTE  Meghan Hoover KGM:[id number] DOB: [birth year] DOA: 11/19/2012 PCP: Kirk Ruths, MD  Summary: 52 year old woman presented to the emergency department with increasing shortness of breath. Initial evaluation was notable for hypoxic respiratory failure, massive volume overload from feet up to abdomen suggesting CHF.  Assessment/Plan: 1. VDRF, acute respiratory acidosis: No significant change. I suspect the primary driving component is acute diastolic heart failure complicated by suspected obesity hypoventilation syndrome, suspected sleep apnea, suspected COPD. She has been treated for pneumonia with a course of antibiotics. Appreciate Pulmonology assistance with vent management. 2. Community acquired pneumonia: Remains afebrile, hemodynamically stable. Lactic acid was normal. Sputum culture was unremarkable. WBC count is normal. She has completed a course of antibiotics. 3. Acute diastolic congestive heart failure with anasarca: Complicated by pulmonary hypertension, suspected cor pulmonale, long-term smoking. Creatinine preserved. Lasix infusion titrated by cardiology. Diuresis has been excellent on lasix infusion. 4. Anemia of critical illness: Overall appears stable. Transfuse for hemoglobin less than 7. 5. Hypokalemia: improved. 6. Hypertension: Stable. 7. Morbid obesity 8. Tobacco dependence: Patient has no desire to quit.  Code Status: full code DVT prophylaxis: Lovenox Family Communication: discussed with husband [name] Disposition Plan: home when improved  Erick Blinks, MD  [facility]  Pager [phone] If 7PM-7AM, please contact night-coverage at [email or web address], password [facility] 11/30/2012, 11:44 AM  LOS: 11 days   Consultants:  Pulmonology  Cardiology  Procedures:  2-D echocardiogram: LVEF 65-70%; Grade 2 diastolic dysfunction. Moderate pulmonary HTN, possible cor pulmonale.  11/1: PICC line placement  Intubation  11/1  HPI/Subjective: Patient remains intubated and sedated on ventilator.  Objective: Filed Vitals:   11/30/12 1030 11/30/12 1045 11/30/12 1100 11/30/12 1115  BP: 126/65  127/68   Pulse: 76 76 76 78  Temp:      TempSrc:      Resp: 16 16 16 16   Height:      Weight:      SpO2: 92% 90% 89% 89%    Intake/Output Summary (Last 24 hours) at 11/30/12 1144 Last data filed at 11/30/12 0911  Gross per 24 hour  Intake 1050.3 ml  Output   6500 ml  Net -5449.7 ml     Filed Weights   11/28/12 1400 11/29/12 0200 11/30/12 0500  Weight: 187.1 kg (412 lb 7.7 oz) 174 kg (383 lb 9.6 oz) 173 kg (381 lb 6.3 oz)    Exam:   Afebrile, vital signs are stable. FiO2 80%.  General: Intubated, sedated, appears calm  Eyes: Pupils equal, round, react to light, normal lids.  ENT: Lips appear unremarkable.  Cardiovascular: Regular rate and rhythm. No murmur, rub or gallop. Some decrease in bilateral lower extremity edema, 1+.  Respiratory: Clear to auscultation bilaterally. Fair air movement. No air trapping on ventilator waveform. No frank wheezes, rales or rhonchi.  Abdomen: Obese, soft.  Skin: No new rash or induration seen.  Neurologic: Cannot assess  Psychiatric: Cannot assess  Scheduled Meds: . ipratropium  0.5 mg Nebulization Q4H   And  . albuterol  2.5 mg Nebulization Q4H  . antiseptic oral rinse  15 mL Mouth Rinse QID  . aspirin  81 mg Oral Daily  . chlorhexidine  15 mL Mouth Rinse BID  . enoxaparin (LOVENOX) injection  100 mg Subcutaneous Q24H  . feeding supplement (PRO-STAT SUGAR FREE 64)  30 mL Per Tube QID  . feeding supplement (VITAL HIGH PROTEIN)  1,000 mL Per Tube Q24H  . insulin aspart  0-15 Units Subcutaneous Q4H  . methylPREDNISolone (SOLU-MEDROL)  injection  40 mg Intravenous Q12H  . multivitamin  5 mL Oral Daily  . nicotine  21 mg Transdermal Daily  . pantoprazole (PROTONIX) IV  40 mg Intravenous Daily  . polyethylene glycol  17 g Per Tube Daily  . potassium  chloride  40 mEq Per Tube TID  . sodium chloride  10-40 mL Intracatheter Q12H  . sodium chloride  3 mL Intravenous Q12H   Continuous Infusions: . sodium chloride 10 mL/hr at 11/29/12 1800  . fentaNYL infusion INTRAVENOUS 75 mcg/hr (11/29/12 1800)  . furosemide (LASIX) infusion 3 mg/hr (11/30/12 1058)  . propofol 26.347 mcg/kg/min (11/30/12 1134)    Principal Problem:   Acute respiratory failure with hypoxia Active Problems:   Volume overload   Bilateral lower extremity edema   HTN (hypertension)   Normocytic anemia   Tobacco dependence   Acute diastolic congestive heart failure   CAP (community acquired pneumonia)   Sepsis   Time spent 30 minutes

## 2012-11-30 NOTE — Progress Notes (Signed)
ANTIBIOTIC CONSULT NOTE  Pharmacy Consult for Vancomycin and Zosyn Indication: pneumonia and fever  Allergies  Allergen Reactions  . Augmentin [Amoxicillin-Pot Clavulanate] Other (See Comments)    thrush   Patient Measurements: Height: 5\' 8"  (172.7 cm) Weight: 381 lb 6.3 oz (173 kg) IBW/kg (Calculated) : 63.9  Vital Signs: Temp: 99.4 F (37.4 C) (11/10 0822) Temp src: Oral (11/10 0822) BP: 127/68 mmHg (11/10 1100) Pulse Rate: 78 (11/10 1115) Intake/Output from previous day: 11/09 0701 - 11/10 0700 In: 1488.6 [I.V.:1308.6; NG/GT:180] Out: 7300 [Urine:7300] Intake/Output from this shift: Total I/O In: -  Out: 1200 [Urine:1200]  Labs:  Recent Labs  11/28/12 0447 11/29/12 0511 11/30/12 0457  CREATININE 0.77 0.80 0.88   Estimated Creatinine Clearance: 125.5 ml/min (by C-G formula based on Cr of 0.88). No results found for this basename: VANCOTROUGH, Leodis Binet, VANCORANDOM, GENTTROUGH, GENTPEAK, GENTRANDOM, TOBRATROUGH, TOBRAPEAK, TOBRARND, AMIKACINPEAK, AMIKACINTROU, AMIKACIN,  in the last 72 hours  Microbiology: Recent Results (from the past 720 hour(s))  MRSA PCR SCREENING     Status: None   Collection Time    11/20/12  9:29 PM      Result Value Range Status   MRSA by PCR NEGATIVE  NEGATIVE Final   Comment:            The GeneXpert MRSA Assay (FDA     approved for NASAL specimens     only), is one component of a     comprehensive MRSA colonization     surveillance program. It is not     intended to diagnose MRSA     infection nor to guide or     monitor treatment for     MRSA infections.  CULTURE, RESPIRATORY (NON-EXPECTORATED)     Status: None   Collection Time    11/21/12  9:27 AM      Result Value Range Status   Specimen Description TRACHEAL ASPIRATE   Final   Special Requests NONE   Final   Gram Stain     Final   Value: ABUNDANT WBC PRESENT, PREDOMINANTLY PMN     RARE SQUAMOUS EPITHELIAL CELLS PRESENT     MODERATE GRAM POSITIVE COCCI IN PAIRS AND  CHAINS     Performed at [facility]   Culture     Final   Value: Non-Pathogenic Oropharyngeal-type Flora Isolated.     Performed at [facility]   Report Status 11/24/2012 FINAL   Final  CULTURE, BLOOD (ROUTINE X 2)     Status: None   Collection Time    11/21/12  9:30 AM      Result Value Range Status   Specimen Description BLOOD RIGHT HAND   Final   Special Requests BOTTLES DRAWN AEROBIC AND ANAEROBIC 8CC   Final   Culture NO GROWTH 5 DAYS   Final   Report Status 11/26/2012 FINAL   Final  CULTURE, BLOOD (ROUTINE X 2)     Status: None   Collection Time    11/21/12  9:39 AM      Result Value Range Status   Specimen Description BLOOD LEFT HAND   Final   Special Requests BOTTLES DRAWN AEROBIC AND ANAEROBIC 8CC   Final   Culture NO GROWTH 5 DAYS   Final   Report Status 11/26/2012 FINAL   Final   Medical History: Past Medical History  Diagnosis Date  . Hypertension   . GERD (gastroesophageal reflux disease)    Medications:  Scheduled:  . ipratropium  0.5 mg Nebulization  Q4H   And  . albuterol  2.5 mg Nebulization Q4H  . antiseptic oral rinse  15 mL Mouth Rinse QID  . aspirin  81 mg Oral Daily  . chlorhexidine  15 mL Mouth Rinse BID  . enoxaparin (LOVENOX) injection  100 mg Subcutaneous Q24H  . feeding supplement (PRO-STAT SUGAR FREE 64)  30 mL Per Tube QID  . feeding supplement (VITAL HIGH PROTEIN)  1,000 mL Per Tube Q24H  . insulin aspart  0-15 Units Subcutaneous Q4H  . methylPREDNISolone (SOLU-MEDROL) injection  40 mg Intravenous Q12H  . multivitamin  5 mL Oral Daily  . nicotine  21 mg Transdermal Daily  . pantoprazole (PROTONIX) IV  40 mg Intravenous Daily  . polyethylene glycol  17 g Per Tube Daily  . potassium chloride  40 mEq Per Tube TID  . sodium chloride  10-40 mL Intracatheter Q12H  . sodium chloride  3 mL Intravenous Q12H   Assessment: 52yo female who is morbidly obese admitted with suspected pna.  She has VDRF in ICU.  She has recently  completed a course of ABX for CAP but has now spiked a temp and therefore asked to restart Zosyn and Vancomycin.  Previous Vancomycin regimen noted.  Pt has good renal fxn.  Estimated Creatinine Clearance: 125.5 ml/min (by C-G formula based on Cr of 0.88).  Augmentin allergy is listed as thrush.  Tracheal aspirate from 11/1 with moderate GPC.  Rocephin 11/1 >> 11/7 Zithromax 11/1 >> 11/6 Vancomycin 11/1 >> 11/7 then 11/10 >> Zosyn 11/10  Goal of Therapy:  Vancomycin trough level 15-20 mcg/ml  Plan:  Vancomycin 2000 mg IV now x 1 dose then Vancomycin 1250mg  IV q12hrs Vancomycin trough level at steady state Zosyn 3.375gm IV q8h to be infused over 4 hrs (Augmentin allergy noted -- thrush) Monitor labs, renal fxn, and cultures  Meghan Hoover A 11/30/2012,12:20 PM

## 2012-11-30 NOTE — Progress Notes (Signed)
Consulting cardiologist: Dina Rich MD  Subjective:   Intubated and sedated but is opening her eyes to verbal stimulation. Denies pain.   Objective:   Temp:  [98.8 F (37.1 C)-99.4 F (37.4 C)] 99.4 F (37.4 C) (11/10 0822) Pulse Rate:  [53-73] 73 (11/10 0915) Resp:  [10-18] 18 (11/10 0915) BP: (92-141)/(48-74) 132/68 mmHg (11/10 0900) SpO2:  [88 %-100 %] 97 % (11/10 0915) FiO2 (%):  [60 %-80 %] 60 % (11/10 0806) Weight:  [381 lb 6.3 oz (173 kg)] 381 lb 6.3 oz (173 kg) (11/10 0500) Last BM Date: 11/28/12  Filed Weights   11/28/12 1400 11/29/12 0200 11/30/12 0500  Weight: 412 lb 7.7 oz (187.1 kg) 383 lb 9.6 oz (174 kg) 381 lb 6.3 oz (173 kg)    Intake/Output Summary (Last 24 hours) at 11/30/12 0951 Last data filed at 11/30/12 0911  Gross per 24 hour  Intake 1247.5 ml  Output   8500 ml  Net -7252.5 ml    Telemetry:  NSR rates in the 70's.   Exam:  General: No acute distress.Intubated.  HEENT: Conjunctiva and lids normal, oropharynx clear.  Lungs: Clear to auscultation, anterior, diminished in the bases. Coughing against the ET   Cardiac: No elevated JVP or bruits. RRR, no gallop or rub.   Abdomen: Normoactive bowel sounds, nontender, nondistended.  Extremities: Non- pitting edema, thickened wrinkled skin. Still with dependent edema distal pulses full.Dressings to bilateral tibia.   Neuropsychiatric: Sedated but opens eyes.   Lab Results:  Basic Metabolic Panel:  Recent Labs Lab 11/24/12 0930 11/25/12 0430  11/28/12 0447 11/29/12 0511 11/30/12 0457  NA  --  140  < > 141 136 140  K  --  3.8  < > 4.6 4.0 4.2  CL  --  93*  < > 93* 89* 92*  CO2  --  41*  < > 38* 40* 37*  GLUCOSE  --  152*  < > 134* 127* 123*  BUN  --  15  < > 34* 41* 47*  CREATININE  --  0.83  < > 0.77 0.80 0.88  CALCIUM  --  9.3  < > 10.1 10.6* 10.5  MG 1.9 2.1  --   --   --   --   < > = values in this interval not displayed.  Liver Function Tests:  Recent Labs Lab  11/24/12 0448 11/25/12 0430  AST 23 27  ALT 11 15  ALKPHOS 68 71  BILITOT 0.6 0.4  PROT 6.6 6.7  ALBUMIN 2.7* 2.6*    CBC:  Recent Labs Lab 11/26/12 0435  WBC 9.6  HGB 10.0*  HCT 36.2  MCV 78.9  PLT 286    BNP:  Recent Labs  11/19/12 1428  PROBNP 3418.0*     Radiology: Dg Chest Port 1 View  11/30/2012   CLINICAL DATA:  Ventilator dependence.  EXAM: PORTABLE CHEST - 1 VIEW  COMPARISON:  11/29/2012  FINDINGS: 0551 hr. Patient is rotated to the right. Retrieved tube tip is approximately 4.5 cm above the base of the chronic. Lung volumes are low. There is left base atelectasis or infiltrate with small left pleural effusion. Vascular congestion noted without overt airspace pulmonary edema. There appears to be an NG tube in place although it is almost imperceptible against the mediastinum and upper abdomen. It does appear did pass below the level of the esophagogastric junction. A left sided PICC line tip overlies the mid SVC level there is also difficult to  discern against the background soft tissues.  IMPRESSION: Cardiomegaly with low volumes and vascular congestion.  Left base atelectasis or infiltrate with left effusion.  Left PICC line and NG tube are not well seen.   Electronically Signed   By: Kennith Center M.D.   On: 11/30/2012 07:46   Dg Chest Port 1 View  11/29/2012   CLINICAL DATA:  Respiratory failure.  EXAM: PORTABLE CHEST - 1 VIEW  COMPARISON:  11/28/2012  FINDINGS: Endotracheal tube has tip 3.5 cm above the carinal. Nasogastric tube courses into the region of the stomach as tip is not definitely visualized. Left-sided PICC line has tip over the region of the SVC at the level of the carinal. The lungs are somewhat hypoinflated with continued opacification over the left base/ retrocardiac region. There is subtle prominence of the perihilar markings suggesting mild vascular congestion with slight interval improvement. Minimal linear atelectasis right midlung. Stable  cardiomegaly. Remainder of the exam is unchanged.  IMPRESSION: Mild perihilar opacification improved likely minimal residual vascular congestion. Persistent left base opacification likely effusions/atelectasis although cannot exclude infection.  Stable cardiomegaly.  Tubes and lines as described.   Electronically Signed   By: Elberta Fortis M.D.   On: 11/29/2012 07:57     Medications:   Scheduled Medications: . ipratropium  0.5 mg Nebulization Q4H   And  . albuterol  2.5 mg Nebulization Q4H  . antiseptic oral rinse  15 mL Mouth Rinse QID  . aspirin  81 mg Oral Daily  . chlorhexidine  15 mL Mouth Rinse BID  . enoxaparin (LOVENOX) injection  100 mg Subcutaneous Q24H  . feeding supplement (PRO-STAT SUGAR FREE 64)  30 mL Per Tube QID  . feeding supplement (VITAL HIGH PROTEIN)  1,000 mL Per Tube Q24H  . insulin aspart  0-15 Units Subcutaneous Q4H  . methylPREDNISolone (SOLU-MEDROL) injection  40 mg Intravenous Q12H  . multivitamin  5 mL Oral Daily  . nicotine  21 mg Transdermal Daily  . pantoprazole (PROTONIX) IV  40 mg Intravenous Daily  . polyethylene glycol  17 g Per Tube Daily  . potassium chloride  40 mEq Per Tube TID  . sodium chloride  10-40 mL Intracatheter Q12H  . sodium chloride  3 mL Intravenous Q12H    Infusions: . sodium chloride 10 mL/hr at 11/29/12 1800  . fentaNYL infusion INTRAVENOUS 75 mcg/hr (11/29/12 1800)  . furosemide (LASIX) infusion 4 mg/hr (11/29/12 1800)  . propofol 24.647 mcg/kg/min (11/30/12 0850)    PRN Medications: sodium chloride, acetaminophen, acetaminophen, fentaNYL, ondansetron (ZOFRAN) IV, sodium chloride, sodium chloride, sodium chloride, sodium chloride   Assessment and Plan:   1. Acute on Chronic Cor [place]: Continues on lasix gtt at 4 mg/hr. She has diuresed avg of 5,600 cc daily with normal creatinine. A total weight loss of 51 bls if weights are accurate. Careful evaluation of BP and ongoing status to prevent pre-load reduction. Currently  is she is stable. Uncertain dry wt at this time. Still has mild dependent edema, but markedly improved since admission. Potassium status is stable.  2.VDRF: She is being weaned on O2 delivery. Down to 60% with improving oxygenation.   3. Hypertension: BP is stable. She is not on any antihypertensive medications at this time.  Bettey Mare. Lyman Bishop NP [facility] 11/30/2012, 9:51 AM  Attending Note Patient seen and discussed with NP Lyman Bishop. From a cardiac standpoint, we have been helping manage her left ventricular diastolic and right ventricular systolic heart failure. She has diuresed 23  liters since admission, and continues to be net negative several liters per day. Her ventilatory oxygen requirements are slowly decreasing. She continues to have evidence of volume overload on exam, her renal function remains stable despite aggressive diuresis. Will decrease lasix gtt, diuresing net negative 5 liters per day puts her at risk for developing AKI as she gets closer to euvolemia. Her right sided dysfunction is likely multifactorial due to left sided dysfunction and multiple respiratory problems including COPD and obesity hypoventilation. Her PASP was reported at 50 mmHg, once euvolemic consider repeat echo to reevaluate pulmonary pressures and RV function once her wedge pressure is decreased.    Dina Rich MD

## 2012-11-30 NOTE — Progress Notes (Signed)
NUTRITION FOLLOW UP  Intervention:    Continuous Vital High Protein @ 10 ml/hr. Providing 240 kcals, 21 grams protein, and 389 ml fluid q 24 hr.   ProStat 30 ml QID via orogastric tube (provides 400 kcal, 60 gr protein).  Flush tube per adult enteral protocol  With propofol included, nutrition support provides 1722 kcals and 81 grams of protein, which meets 100% of minimum estimated calorie needs and 66% minimum estimated protein needs.  Nutrition Dx:   Inadequate oral intake related to inability to eat as evidenced by NPO status.   Goal:   Enteral nutrition to provide 60-70% of estimated calorie needs (22-25 kcals/kg ideal body weight) and 100% of estimated protein needs, based on ASPEN guidelines for permissive underfeeding in critically ill obese individuals. Progressing.  Monitor:    Respiratory status, nutrition support measures, wt changes and labs  Assessment:  She continues sedated on ventilator support.  Tolerating TF. Continue current nutrition care and advance rate as able based on propofol rate titration. BM and desirable wt decrease noted.  MV: 9 mL/min Temp:Temp (24hrs), Avg:98.7 F (37.1 C), Min:98.4 F (36.9 C), Max:99 F (37.2 C)  Propofol: 41 ml/hr provides caloric lipid load of  1082 kcals q 24 hr at current rate.  Height: Ht Readings from Last 1 Encounters:  11/30/12 5\' 8"  (1.727 m)  IBW- 135# (61.3 kg)  Weight Status:   Wt Readings from Last 1 Encounters:  11/29/12 383 lb 9.6 oz (174 kg)  11/19/12 wt. 380#  Re-estimated needs:  Kcal: 2822 (60-70%=1693-1975 kcal daily) Protein:122-134 gr  Fluid: per team goals  Skin: intact  Diet Order: NPO   Intake/Output Summary (Last 24 hours) at 11/30/12 0148 Last data filed at 11/29/12 2211  Gross per 24 hour  Intake 1673.06 ml  Output   7851 ml  Net -6177.94 ml  Net since admission 11/19/12 (-22.2 liters)   Last BM: 11/28/12 small, soft brown stool   Labs:   Recent Labs Lab 11/23/12 0430   11/24/12 0930 11/25/12 0430  11/27/12 0418 11/28/12 0447 11/29/12 0511  NA 144  < >  --  140  < > 143 141 136  K 2.5*  < >  --  3.8  < > 4.6 4.6 4.0  CL 92*  < >  --  93*  < > 96 93* 89*  CO2 43*  < >  --  41*  < > 38* 38* 40*  BUN 11  < >  --  15  < > 29* 34* 41*  CREATININE 0.95  < >  --  0.83  < > 0.80 0.77 0.80  CALCIUM 8.6  < >  --  9.3  < > 10.0 10.1 10.6*  MG 1.8  --  1.9 2.1  --   --   --   --   GLUCOSE 119*  < >  --  152*  < > 143* 134* 127*  < > = values in this interval not displayed.  CBG (last 3)  No results found for this basename: GLUCAP,  in the last 72 hours  Scheduled Meds: . ipratropium  0.5 mg Nebulization Q4H   And  . albuterol  2.5 mg Nebulization Q4H  . antiseptic oral rinse  15 mL Mouth Rinse QID  . aspirin  81 mg Oral Daily  . chlorhexidine  15 mL Mouth Rinse BID  . enoxaparin (LOVENOX) injection  100 mg Subcutaneous Q24H  . feeding supplement (PRO-STAT SUGAR FREE 64)  30  mL Per Tube QID  . feeding supplement (VITAL HIGH PROTEIN)  1,000 mL Per Tube Q24H  . insulin aspart  0-15 Units Subcutaneous Q4H  . methylPREDNISolone (SOLU-MEDROL) injection  40 mg Intravenous Q12H  . multivitamin  5 mL Oral Daily  . nicotine  21 mg Transdermal Daily  . pantoprazole (PROTONIX) IV  40 mg Intravenous Daily  . polyethylene glycol  17 g Per Tube Daily  . potassium chloride  40 mEq Per Tube TID  . sodium chloride  10-40 mL Intracatheter Q12H  . sodium chloride  3 mL Intravenous Q12H    Continuous Infusions: . sodium chloride 10 mL/hr at 11/29/12 1800  . fentaNYL infusion INTRAVENOUS 75 mcg/hr (11/29/12 1800)  . furosemide (LASIX) infusion 4 mg/hr (11/29/12 1800)  . propofol 35 mcg/kg/min (11/30/12 0130)    Royann Shivers MS,RD,CSG,LDN Office: [phone] Pager: [phone]

## 2012-11-30 NOTE — Progress Notes (Signed)
Subjective: She has no new complaints noted. She is still sedated this morning. Her oxygenation is improving.  Objective: Vital signs in last 24 hours: Temp:  [98.8 F (37.1 C)-99.1 F (37.3 C)] 99.1 F (37.3 C) (11/10 0400) Pulse Rate:  [53-65] 65 (11/10 0600) Resp:  [10-18] 16 (11/10 0600) BP: (92-135)/(48-71) 113/62 mmHg (11/10 0600) SpO2:  [86 %-100 %] 94 % (11/10 0600) FiO2 (%):  [70 %-80 %] 70 % (11/10 0600) Weight:  [173 kg (381 lb 6.3 oz)] 173 kg (381 lb 6.3 oz) (11/10 0500) Weight change: -14.1 kg (-31 lb 1.4 oz) Last BM Date: 11/28/12  Intake/Output from previous day: 11/09 0701 - 11/10 0700 In: 1488.6 [I.V.:1308.6; NG/GT:180] Out: 7300 [Urine:7300]  PHYSICAL EXAM General appearance: no distress, morbidly obese and Intubated and sedated Resp: diminished breath sounds bilaterally Cardio: regular rate and rhythm, S1, S2 normal, no murmur, click, rub or gallop GI: soft, non-tender; bowel sounds normal; no masses,  no organomegaly Extremities: She still has some edema but last. Her venous stasis changes are still present. She has ulcerated areas on both lower legs  Lab Results:    Basic Metabolic Panel:  Recent Labs  16/10/96 0511 11/30/12 0457  NA 136 140  K 4.0 4.2  CL 89* 92*  CO2 40* 37*  GLUCOSE 127* 123*  BUN 41* 47*  CREATININE 0.80 0.88  CALCIUM 10.6* 10.5   Liver Function Tests: No results found for this basename: AST, ALT, ALKPHOS, BILITOT, PROT, ALBUMIN,  in the last 72 hours No results found for this basename: LIPASE, AMYLASE,  in the last 72 hours No results found for this basename: AMMONIA,  in the last 72 hours CBC: No results found for this basename: WBC, NEUTROABS, HGB, HCT, MCV, PLT,  in the last 72 hours Cardiac Enzymes: No results found for this basename: CKTOTAL, CKMB, CKMBINDEX, TROPONINI,  in the last 72 hours BNP: No results found for this basename: PROBNP,  in the last 72 hours D-Dimer: No results found for this basename:  DDIMER,  in the last 72 hours CBG: No results found for this basename: GLUCAP,  in the last 72 hours Hemoglobin A1C: No results found for this basename: HGBA1C,  in the last 72 hours Fasting Lipid Panel:  Recent Labs  11/30/12 0456  TRIG 181*   Thyroid Function Tests: No results found for this basename: TSH, T4TOTAL, FREET4, T3FREE, THYROIDAB,  in the last 72 hours Anemia Panel: No results found for this basename: VITAMINB12, FOLATE, FERRITIN, TIBC, IRON, RETICCTPCT,  in the last 72 hours Coagulation: No results found for this basename: LABPROT, INR,  in the last 72 hours Urine Drug Screen: Drugs of Abuse  No results found for this basename: labopia, cocainscrnur, labbenz, amphetmu, thcu, labbarb    Alcohol Level: No results found for this basename: ETH,  in the last 72 hours Urinalysis: No results found for this basename: COLORURINE, APPERANCEUR, LABSPEC, PHURINE, GLUCOSEU, HGBUR, BILIRUBINUR, KETONESUR, PROTEINUR, UROBILINOGEN, NITRITE, LEUKOCYTESUR,  in the last 72 hours Misc. Labs:  ABGS  Recent Labs  11/30/12 0428  PHART 7.495*  PO2ART 67.8*  TCO2 32.0  HCO3 35.8*   CULTURES Recent Results (from the past 240 hour(s))  MRSA PCR SCREENING     Status: None   Collection Time    11/20/12  9:29 PM      Result Value Range Status   MRSA by PCR NEGATIVE  NEGATIVE Final   Comment:            The  GeneXpert MRSA Assay (FDA     approved for NASAL specimens     only), is one component of a     comprehensive MRSA colonization     surveillance program. It is not     intended to diagnose MRSA     infection nor to guide or     monitor treatment for     MRSA infections.  CULTURE, RESPIRATORY (NON-EXPECTORATED)     Status: None   Collection Time    11/21/12  9:27 AM      Result Value Range Status   Specimen Description TRACHEAL ASPIRATE   Final   Special Requests NONE   Final   Gram Stain     Final   Value: ABUNDANT WBC PRESENT, PREDOMINANTLY PMN     RARE SQUAMOUS  EPITHELIAL CELLS PRESENT     MODERATE GRAM POSITIVE COCCI IN PAIRS AND CHAINS     Performed at [facility]   Culture     Final   Value: Non-Pathogenic Oropharyngeal-type Flora Isolated.     Performed at [facility]   Report Status 11/24/2012 FINAL   Final  CULTURE, BLOOD (ROUTINE X 2)     Status: None   Collection Time    11/21/12  9:30 AM      Result Value Range Status   Specimen Description BLOOD RIGHT HAND   Final   Special Requests BOTTLES DRAWN AEROBIC AND ANAEROBIC 8CC   Final   Culture NO GROWTH 5 DAYS   Final   Report Status 11/26/2012 FINAL   Final  CULTURE, BLOOD (ROUTINE X 2)     Status: None   Collection Time    11/21/12  9:39 AM      Result Value Range Status   Specimen Description BLOOD LEFT HAND   Final   Special Requests BOTTLES DRAWN AEROBIC AND ANAEROBIC 8CC   Final   Culture NO GROWTH 5 DAYS   Final   Report Status 11/26/2012 FINAL   Final   Studies/Results: Dg Chest Port 1 View  11/30/2012   CLINICAL DATA:  Ventilator dependence.  EXAM: PORTABLE CHEST - 1 VIEW  COMPARISON:  11/29/2012  FINDINGS: 0551 hr. Patient is rotated to the right. Retrieved tube tip is approximately 4.5 cm above the base of the chronic. Lung volumes are low. There is left base atelectasis or infiltrate with small left pleural effusion. Vascular congestion noted without overt airspace pulmonary edema. There appears to be an NG tube in place although it is almost imperceptible against the mediastinum and upper abdomen. It does appear did pass below the level of the esophagogastric junction. A left sided PICC line tip overlies the mid SVC level there is also difficult to discern against the background soft tissues.  IMPRESSION: Cardiomegaly with low volumes and vascular congestion.  Left base atelectasis or infiltrate with left effusion.  Left PICC line and NG tube are not well seen.   Electronically Signed   By: Kennith Center M.D.   On: 11/30/2012 07:46   Dg Chest Port 1  View  11/29/2012   CLINICAL DATA:  Respiratory failure.  EXAM: PORTABLE CHEST - 1 VIEW  COMPARISON:  11/28/2012  FINDINGS: Endotracheal tube has tip 3.5 cm above the carinal. Nasogastric tube courses into the region of the stomach as tip is not definitely visualized. Left-sided PICC line has tip over the region of the SVC at the level of the carinal. The lungs are somewhat hypoinflated with continued opacification over the left  base/ retrocardiac region. There is subtle prominence of the perihilar markings suggesting mild vascular congestion with slight interval improvement. Minimal linear atelectasis right midlung. Stable cardiomegaly. Remainder of the exam is unchanged.  IMPRESSION: Mild perihilar opacification improved likely minimal residual vascular congestion. Persistent left base opacification likely effusions/atelectasis although cannot exclude infection.  Stable cardiomegaly.  Tubes and lines as described.   Electronically Signed   By: Elberta Fortis M.D.   On: 11/29/2012 07:57   Dg Chest Port 1 View  11/28/2012   CLINICAL DATA:  Respiratory failure  EXAM: PORTABLE CHEST - 1 VIEW  COMPARISON:  11/27/2012  FINDINGS: Suboptimal evaluation with under penetration due to body habitus and portable technique.  Endotracheal tube is poorly visualized but likely terminates 4.3 cm above the carina. Enteric tube is poorly visualized but likely courses below the diaphragm. Left subclavian venous catheter is poorly visualized but likely terminates in the lower SVC.  Increased interstitial markings, more prominently involving the right upper lobe, possibly reflecting infection or asymmetric pulmonary edema. This appearance grossly unchanged.  Poor visualization of the retrocardiac region. Possible small left pleural effusion. No pneumothorax. .  IMPRESSION: Suboptimal evaluation.  Endotracheal tube is poorly visualized but likely terminates 4.3 cm above the carina. Additional support apparatus are grossly unchanged.   Increased interstitial markings, more prominently involving the right upper lobe, possibly reflecting infection or asymmetric pulmonary edema, grossly unchanged.  Poor visualization of the retrocardiac region. Possible small left pleural effusion.   Electronically Signed   By: Charline Bills M.D.   On: 11/28/2012 10:22    Medications:  Scheduled: . ipratropium  0.5 mg Nebulization Q4H   And  . albuterol  2.5 mg Nebulization Q4H  . antiseptic oral rinse  15 mL Mouth Rinse QID  . aspirin  81 mg Oral Daily  . chlorhexidine  15 mL Mouth Rinse BID  . enoxaparin (LOVENOX) injection  100 mg Subcutaneous Q24H  . feeding supplement (PRO-STAT SUGAR FREE 64)  30 mL Per Tube QID  . feeding supplement (VITAL HIGH PROTEIN)  1,000 mL Per Tube Q24H  . insulin aspart  0-15 Units Subcutaneous Q4H  . methylPREDNISolone (SOLU-MEDROL) injection  40 mg Intravenous Q12H  . multivitamin  5 mL Oral Daily  . nicotine  21 mg Transdermal Daily  . pantoprazole (PROTONIX) IV  40 mg Intravenous Daily  . polyethylene glycol  17 g Per Tube Daily  . potassium chloride  40 mEq Per Tube TID  . sodium chloride  10-40 mL Intracatheter Q12H  . sodium chloride  3 mL Intravenous Q12H   Continuous: . sodium chloride 10 mL/hr at 11/29/12 1800  . fentaNYL infusion INTRAVENOUS 75 mcg/hr (11/29/12 1800)  . furosemide (LASIX) infusion 4 mg/hr (11/29/12 1800)  . propofol 35 mcg/kg/min (11/30/12 0550)   ZOX:WRUEAV chloride, acetaminophen, acetaminophen, fentaNYL, ondansetron (ZOFRAN) IV, sodium chloride, sodium chloride, sodium chloride, sodium chloride  Assesment: She has acute respiratory failure which is multifactorial. She has acute diastolic CHF, COPD community acquired pneumonia and obesity hypoventilation. She has had significant diuresis now and her oxygenation is better. She is not taking high pressures to ventilate and I do not think this represents ARDS. Principal Problem:   Acute respiratory failure with  hypoxia Active Problems:   Volume overload   Bilateral lower extremity edema   HTN (hypertension)   Normocytic anemia   Tobacco dependence   Acute diastolic congestive heart failure   CAP (community acquired pneumonia)   Sepsis    Plan: Continue current medications  and treatments    LOS: 11 days   Salem Lembke L 11/30/2012, 7:56 AM

## 2012-12-01 ENCOUNTER — Inpatient Hospital Stay (HOSPITAL_COMMUNITY): Payer: BC Managed Care – PPO

## 2012-12-01 DIAGNOSIS — J9600 Acute respiratory failure, unspecified whether with hypoxia or hypercapnia: Secondary | ICD-10-CM

## 2012-12-01 LAB — URINALYSIS, ROUTINE W REFLEX MICROSCOPIC
Bilirubin Urine: NEGATIVE
Ketones, ur: NEGATIVE mg/dL
Nitrite: NEGATIVE
Protein, ur: NEGATIVE mg/dL
Specific Gravity, Urine: 1.017 (ref 1.005–1.030)
Urobilinogen, UA: 0.2 mg/dL (ref 0.0–1.0)

## 2012-12-01 LAB — URINE CULTURE: Culture: NO GROWTH

## 2012-12-01 LAB — COMPREHENSIVE METABOLIC PANEL
AST: 12 U/L (ref 0–37)
Albumin: 3.3 g/dL — ABNORMAL LOW (ref 3.5–5.2)
Alkaline Phosphatase: 66 U/L (ref 39–117)
BUN: 39 mg/dL — ABNORMAL HIGH (ref 6–23)
CO2: 29 mEq/L (ref 19–32)
Chloride: 96 mEq/L (ref 96–112)
Potassium: 4.1 mEq/L (ref 3.5–5.1)
Sodium: 137 mEq/L (ref 135–145)
Total Bilirubin: 0.6 mg/dL (ref 0.3–1.2)
Total Protein: 7.5 g/dL (ref 6.0–8.3)

## 2012-12-01 LAB — POCT I-STAT 3, ART BLOOD GAS (G3+)
Acid-Base Excess: 8 mmol/L — ABNORMAL HIGH (ref 0.0–2.0)
O2 Saturation: 93 %
pCO2 arterial: 60.5 mmHg (ref 35.0–45.0)

## 2012-12-01 LAB — BLOOD GAS, ARTERIAL
Drawn by: 213101
O2 Saturation: 91.1 %
PEEP: 10 cmH2O
RATE: 16 resp/min
pCO2 arterial: 49.3 mmHg — ABNORMAL HIGH (ref 35.0–45.0)
pH, Arterial: 7.447 (ref 7.350–7.450)
pO2, Arterial: 65.8 mmHg — ABNORMAL LOW (ref 80.0–100.0)

## 2012-12-01 LAB — GLUCOSE, CAPILLARY
Glucose-Capillary: 150 mg/dL — ABNORMAL HIGH (ref 70–99)
Glucose-Capillary: 119 mg/dL — ABNORMAL HIGH (ref 70–99)
Glucose-Capillary: 130 mg/dL — ABNORMAL HIGH (ref 70–99)
Glucose-Capillary: 135 mg/dL — ABNORMAL HIGH (ref 70–99)
Glucose-Capillary: 116 mg/dL — ABNORMAL HIGH (ref 70–99)

## 2012-12-01 LAB — CBC WITH DIFFERENTIAL/PLATELET
Basophils Absolute: 0 10*3/uL (ref 0.0–0.1)
Eosinophils Absolute: 0 10*3/uL (ref 0.0–0.7)
Eosinophils Relative: 0 % (ref 0–5)
Lymphocytes Relative: 7 % — ABNORMAL LOW (ref 12–46)
MCHC: 29.6 g/dL — ABNORMAL LOW (ref 30.0–36.0)
MCV: 75.2 fL — ABNORMAL LOW (ref 78.0–100.0)
Neutro Abs: 16.8 10*3/uL — ABNORMAL HIGH (ref 1.7–7.7)
Neutrophils Relative %: 88 % — ABNORMAL HIGH (ref 43–77)
Platelets: 300 10*3/uL (ref 150–400)
RBC: 5.21 MIL/uL — ABNORMAL HIGH (ref 3.87–5.11)
RDW: 21.2 % — ABNORMAL HIGH (ref 11.5–15.5)

## 2012-12-01 LAB — URINE MICROSCOPIC-ADD ON

## 2012-12-01 LAB — BASIC METABOLIC PANEL
BUN: 44 mg/dL — ABNORMAL HIGH (ref 6–23)
CO2: 34 mEq/L — ABNORMAL HIGH (ref 19–32)
GFR calc Af Amer: 78 mL/min — ABNORMAL LOW (ref >90)
GFR calc non Af Amer: 67 mL/min — ABNORMAL LOW (ref >90)
Potassium: 3.9 mEq/L (ref 3.5–5.1)
Sodium: 141 mEq/L (ref 135–145)

## 2012-12-01 LAB — APTT: aPTT: 21 seconds — ABNORMAL LOW (ref 24–37)

## 2012-12-01 LAB — PROTIME-INR: Prothrombin Time: 13.6 seconds (ref 11.6–15.2)

## 2012-12-01 LAB — LACTIC ACID, PLASMA: Lactic Acid, Venous: 0.9 mmol/L (ref 0.5–2.2)

## 2012-12-01 LAB — PHOSPHORUS: Phosphorus: 6.4 mg/dL — ABNORMAL HIGH (ref 2.3–4.6)

## 2012-12-01 LAB — MAGNESIUM: Magnesium: 2.8 mg/dL — ABNORMAL HIGH (ref 1.5–2.5)

## 2012-12-01 LAB — MRSA PCR SCREENING: MRSA by PCR: NEGATIVE

## 2012-12-01 MED ORDER — POTASSIUM CHLORIDE 20 MEQ/15ML (10%) PO LIQD
40.0000 meq | Freq: Three times a day (TID) | ORAL | Status: AC
  Administered 2012-12-01 (×2): 40 meq
  Filled 2012-12-01 (×2): qty 30

## 2012-12-01 MED ORDER — SODIUM CHLORIDE 0.9 % IV SOLN: 0.0000 mg/h | INTRAVENOUS | Status: DC

## 2012-12-01 MED ORDER — SODIUM CHLORIDE 0.9 % IV SOLN: 0.0000 ug/h | INTRAVENOUS | Status: DC

## 2012-12-01 MED ORDER — FENTANYL BOLUS VIA INFUSION: 50.0000 ug | INTRAVENOUS | Status: DC | PRN

## 2012-12-01 MED ORDER — PANTOPRAZOLE SODIUM 40 MG IV SOLR
40.0000 mg | INTRAVENOUS | Status: DC
  Filled 2012-12-01: qty 40

## 2012-12-01 MED ORDER — ALBUTEROL SULFATE HFA 108 (90 BASE) MCG/ACT IN AERS: 8.0000 | INHALATION_SPRAY | RESPIRATORY_TRACT | Status: DC | PRN

## 2012-12-01 MED ORDER — PRO-STAT SUGAR FREE PO LIQD
60.0000 mL | Freq: Three times a day (TID) | ORAL | Status: DC
  Administered 2012-12-01 – 2012-12-02 (×3): 60 mL
  Filled 2012-12-01 (×5): qty 60

## 2012-12-01 MED ORDER — IPRATROPIUM BROMIDE 0.02 % IN SOLN
0.5000 mg | RESPIRATORY_TRACT | Status: DC
  Administered 2012-12-01 – 2012-12-06 (×32): 0.5 mg via RESPIRATORY_TRACT
  Filled 2012-12-01 (×30): qty 2.5

## 2012-12-01 MED ORDER — INSULIN ASPART 100 UNIT/ML ~~LOC~~ SOLN
2.0000 [IU] | SUBCUTANEOUS | Status: DC
  Administered 2012-12-01 – 2012-12-02 (×3): 2 [IU] via SUBCUTANEOUS
  Administered 2012-12-02: 4 [IU] via SUBCUTANEOUS
  Administered 2012-12-02 – 2012-12-03 (×2): 2 [IU] via SUBCUTANEOUS
  Administered 2012-12-03: 4 [IU] via SUBCUTANEOUS
  Administered 2012-12-03 – 2012-12-05 (×6): 2 [IU] via SUBCUTANEOUS
  Administered 2012-12-06 – 2012-12-07 (×7): 4 [IU] via SUBCUTANEOUS
  Administered 2012-12-07 (×5): 2 [IU] via SUBCUTANEOUS
  Administered 2012-12-08: 4 [IU] via SUBCUTANEOUS
  Administered 2012-12-08: 2 [IU] via SUBCUTANEOUS
  Administered 2012-12-08: 4 [IU] via SUBCUTANEOUS
  Administered 2012-12-08 – 2012-12-09 (×5): 2 [IU] via SUBCUTANEOUS

## 2012-12-01 MED ORDER — FENTANYL CITRATE 0.05 MG/ML IJ SOLN: 100.0000 ug | INTRAMUSCULAR | Status: DC | PRN

## 2012-12-01 MED ORDER — HEPARIN SODIUM (PORCINE) 5000 UNIT/ML IJ SOLN: 5000.0000 [IU] | Freq: Three times a day (TID) | INTRAMUSCULAR | Status: DC

## 2012-12-01 MED ORDER — FUROSEMIDE 10 MG/ML IJ SOLN
3.0000 mg/h | INTRAVENOUS | Status: DC
  Filled 2012-12-01: qty 25

## 2012-12-01 MED ORDER — MIDAZOLAM HCL 5 MG/ML IJ SOLN
1.0000 mg/h | INTRAMUSCULAR | Status: DC
  Filled 2012-12-01: qty 10

## 2012-12-01 MED ORDER — PIPERACILLIN-TAZOBACTAM 3.375 G IVPB
3.3750 g | Freq: Three times a day (TID) | INTRAVENOUS | Status: DC
  Filled 2012-12-01 (×2): qty 50

## 2012-12-01 MED ORDER — VANCOMYCIN HCL IN DEXTROSE 1-5 GM/200ML-% IV SOLN: 1000.0000 mg | Freq: Two times a day (BID) | INTRAVENOUS | Status: DC

## 2012-12-01 MED ORDER — FENTANYL CITRATE 0.05 MG/ML IJ SOLN: 50.0000 ug | Freq: Once | INTRAMUSCULAR | Status: DC

## 2012-12-01 MED ORDER — ROCURONIUM BROMIDE 50 MG/5ML IV SOLN: 50.0000 mg | Freq: Once | INTRAVENOUS | Status: DC

## 2012-12-01 MED ORDER — MIDAZOLAM BOLUS VIA INFUSION: 1.0000 mg | INTRAVENOUS | Status: DC | PRN

## 2012-12-01 MED ORDER — ALBUTEROL SULFATE (5 MG/ML) 0.5% IN NEBU
2.5000 mg | INHALATION_SOLUTION | RESPIRATORY_TRACT | Status: DC
  Administered 2012-12-01 – 2012-12-06 (×32): 2.5 mg via RESPIRATORY_TRACT
  Filled 2012-12-01 (×30): qty 0.5

## 2012-12-01 MED ORDER — IPRATROPIUM-ALBUTEROL 20-100 MCG/ACT IN AERS
6.0000 | INHALATION_SPRAY | RESPIRATORY_TRACT | Status: DC
  Filled 2012-12-01: qty 4

## 2012-12-01 MED ORDER — FUROSEMIDE 10 MG/ML IJ SOLN
4.0000 mg/h | INTRAVENOUS | Status: AC
  Filled 2012-12-01: qty 25

## 2012-12-01 NOTE — H&P (Signed)
PULMONARY  / CRITICAL CARE MEDICINE  Name: Meghan Hoover MRN: [id number] DOB: 09/21/1960    ADMISSION DATE:  11/19/2012 CONSULTATION DATE:  12/01/12  REFERRING MD :  Dr. Kerry Hough PRIMARY SERVICE: PCCM  CHIEF COMPLAINT:  Shortness of breath  BRIEF PATIENT DESCRIPTION: 52 y.o. Current smoker  With PMH of HTN presented to [facility] ED 10/30 with shortness of breath and massive edema.  Patient admitted to [facility] ICU and treated for CHF and CAP.  Patient to be transferred to [facility] for trach and further care.    SIGNIFICANT EVENTS / STUDIES:  10/30 admitted [facility] for fluid overload and respiratory failure. 11/11 transfer to [facility] for tracheostomy and further care.  LINES / TUBES: [employer] TLC 11/10>>> PICC line unknown placement date>>>11/10 ET tube 10/30>>> NGT 10/30>>>  CULTURES: C. Diff 11/10>>>NTD. Urine 11/10>>>NTD Blood 11/10>>>NTD Blood 11/1>>>NTD Sputum 11/1>>>NTD  ANTIBIOTICS: Vanc unknown start date>>> Zosyn unknown start date>>>  HISTORY OF PRESENT ILLNESS:  52 y.o. Current smoker  With PMH of HTN presented to [facility] ED 10/30 with shortness of breath and massive edema.  Patient admitted to [facility] ICU and treated for CHF and CAP.  Patient to be transferred to [facility] for trach and further care.   PAST MEDICAL HISTORY :  Past Medical History  Diagnosis Date  . Hypertension   . GERD (gastroesophageal reflux disease)    Past Surgical History  Procedure Laterality Date  . Achilles tendon surgery    . Back surgery     Prior to Admission medications   Medication Sig Start Date End Date Taking? Authorizing Provider  betamethasone dipropionate (DIPROLENE) 0.05 % cream Apply 1 application topically 2 (two) times daily as needed. rash 11/06/12  Yes Historical Provider, MD  esomeprazole (NEXIUM) 40 MG capsule Take 40 mg by mouth daily before breakfast.   Yes Historical Provider, MD  furosemide (LASIX) 20 MG tablet Take 10 mg by mouth daily.    Yes Historical Provider, MD  losartan (COZAAR)  100 MG tablet Take 100 mg by mouth daily.   Yes Historical Provider, MD  methocarbamol (ROBAXIN) 500 MG tablet Take 500 mg by mouth daily.    Yes Historical Provider, MD   Allergies  Allergen Reactions  . Augmentin [Amoxicillin-Pot Clavulanate] Other (See Comments)    thrush   FAMILY HISTORY:  Family History  Problem Relation Age of Onset  . Diabetes Sister    SOCIAL HISTORY:  reports that she has been smoking Cigarettes.  She has been smoking about 2.00 packs per day. She does not have any smokeless tobacco history on file. She reports that she does not drink alcohol or use illicit drugs.  REVIEW OF SYSTEMS:  Sedated and intubated, unable to obtain.  SUBJECTIVE: Sedated and intubated.  VITAL SIGNS: Temp:  [98.6 F (37 C)-102.3 F (39.1 C)] 99.1 F (37.3 C) (11/11 0759) Pulse Rate:  [57-93] 61 (11/11 0800) Resp:  [14-22] 16 (11/11 0800) BP: (92-140)/(44-83) 103/57 mmHg (11/11 0800) SpO2:  [87 %-97 %] 91 % (11/11 0800) FiO2 (%):  [60 %-70 %] 70 % (11/11 0712) Weight:  [400 lb 2.2 oz (181.5 kg)] 400 lb 2.2 oz (181.5 kg) (11/11 0500) HEMODYNAMICS:   VENTILATOR SETTINGS: Vent Mode:  [-] PRVC FiO2 (%):  [60 %-70 %] 70 % Set Rate:  [16 bmp] 16 bmp Vt Set:  [550 mL] 550 mL PEEP:  [10 cmH20] 10 cmH20 Plateau Pressure:  [23 cmH20-27 cmH20] 23 cmH20 INTAKE / OUTPUT: Intake/Output     11/10 0701 -  11/11 0700 11/11 0701 - 11/12 0700   I.V. (mL/kg) 1355.6 (7.5)    NG/GT 480    IV Piggyback 300    Total Intake(mL/kg) 2135.6 (11.8)    Urine (mL/kg/hr) 4750 (1.1) 650 (2.8)   Total Output 4750 650   Net -2614.4 -650        Stool Occurrence 1 x      PHYSICAL EXAMINATION: General:  Chronically ill appearing morbidly obese female. Neuro:  Arousable, follows some commands, moves all ext to command. HEENT:  [place]/AT, PERRL, EOM-I and MMM. Cardiovascular:  RRR, Nl S1/S2, -M/R/G. Lungs:  Diffuse crackles, coarse BS. Abdomen:  Soft, NT, ND and +BS. Musculoskeletal:  Diffuse edema  2+. Skin:  Intact.  LABS:  Recent Labs Lab 11/24/12 0930  11/25/12 0430  11/26/12 0435  11/29/12 0511 11/29/12 0548 11/30/12 0428 11/30/12 0457 12/01/12 0426 12/01/12 0525  HGB  --   --   --   --  10.0*  --   --   --   --   --   --   --   WBC  --   --   --   --  9.6  --   --   --   --   --   --   --   PLT  --   --   --   --  286  --   --   --   --   --   --   --   NA  --   < > 140  --  143  < > 136  --   --  140 141  --   K  --   < > 3.8  --  4.1  < > 4.0  --   --  4.2 3.9  --   CL  --   < > 93*  --  97  < > 89*  --   --  92* 96  --   CO2  --   < > 41*  --  40*  < > 40*  --   --  37* 34*  --   GLUCOSE  --   < > 152*  --  162*  < > 127*  --   --  123* 138*  --   BUN  --   < > 15  --  23  < > 41*  --   --  47* 44*  --   CREATININE  --   < > 0.83  --  0.81  < > 0.80  --   --  0.88 0.95  --   CALCIUM  --   < > 9.3  --  9.6  < > 10.6*  --   --  10.5 10.1  --   MG 1.9  --  2.1  --   --   --   --   --   --   --   --   --   AST  --   --  27  --   --   --   --   --   --   --   --   --   ALT  --   --  15  --   --   --   --   --   --   --   --   --   ALKPHOS  --   --  71  --   --   --   --   --   --   --   --   --  BILITOT  --   --  0.4  --   --   --   --   --   --   --   --   --   PROT  --   --  6.7  --   --   --   --   --   --   --   --   --   ALBUMIN  --   --  2.6*  --   --   --   --   --   --   --   --   --   PHART  --   < >  --   < >  --   < >  --  7.502* 7.495*  --   --  7.447  PCO2ART  --   < >  --   < >  --   < >  --  49.4* 46.8*  --   --  49.3*  PO2ART  --   < >  --   < >  --   < >  --  61.0* 67.8*  --   --  65.8*  < > = values in this interval not displayed.  Recent Labs Lab 11/30/12 2019 12/01/12 0029 12/01/12 0046 12/01/12 0307 12/01/12 0720  GLUCAP 105* 140* 141* 150* 119*    CXR: Poor quality, ET tube   ASSESSMENT / PLAN:  PULMONARY A: VDRF due fluid overload with CHF and anasarca.  Concern was for PNA on presentation.  Exact date of starting abx is  unknown. P:   - Maintain full vent support. - ABG now. - CXR now. - Adjust vent accordingly. - Aggressive diureses. - CXR and ABG in AM.  CARDIOVASCULAR A: CHF by history. P:  - 2D echo. - BNP. - Diureses as ordered.  RENAL A:  Anasarca, renal function holding. P:   - Lasix 4 mg/hr. - Replace K. - Labs now.  GASTROINTESTINAL A:  No active issues. P:   - OGT. - TF per nutrition.  HEMATOLOGIC A:  No active issues. P:  - CBC now. - CBC in AM.  INFECTIOUS A:  Vanc and zosyn for ?HCAP. P:   - Vanc and zosyn. - Pan culture. - ?when abx were started, needs 8 days.  ENDOCRINE A:  DM.   P:   - Cortisol level. - ISS. - CBGs.  NEUROLOGIC A:  No active issues. P:   - Propofol and fentanyl. - If not improving in AM with active diureses recommend change to versed/fentanyl pushes.  Husband updated bedside.  I have personally obtained a history, examined the patient, evaluated laboratory and imaging results, formulated the assessment and plan and placed orders.  CRITICAL CARE: The patient is critically ill with multiple organ systems failure and requires high complexity decision making for assessment and support, frequent evaluation and titration of therapies, application of advanced monitoring technologies and extensive interpretation of multiple databases. Critical Care Time devoted to patient care services described in this note is 45 minutes.   Alyson Reedy, M.D. Pulmonary and Critical Care Medicine [facility] Pager: [phone]  12/01/2012, 8:17 AM

## 2012-12-01 NOTE — Progress Notes (Signed)
Subjective: She had fever during the night. She remains intubated and on the ventilator.  Objective: Vital signs in last 24 hours: Temp:  [98.6 F (37 C)-102.3 F (39.1 C)] 99.1 F (37.3 C) (11/11 0759) Pulse Rate:  [57-93] 67 (11/11 0830) Resp:  [14-22] 15 (11/11 0830) BP: (92-137)/(44-83) 103/57 mmHg (11/11 0800) SpO2:  [87 %-97 %] 90 % (11/11 0830) FiO2 (%):  [60 %-80 %] 80 % (11/11 0800) Weight:  [171.1 kg (377 lb 3.3 oz)-181.5 kg (400 lb 2.2 oz)] 171.1 kg (377 lb 3.3 oz) (11/11 0800) Weight change: 8.5 kg (18 lb 11.8 oz) Last BM Date: 12/01/12  Intake/Output from previous day: 11/10 0701 - 11/11 0700 In: 1989 [I.V.:1199; NG/GT:490; IV Piggyback:300] Out: 4750 [Urine:4750]  PHYSICAL EXAM General appearance: morbidly obese and Intubated and sedated Resp: rhonchi bilaterally Cardio: regular rate and rhythm, S1, S2 normal, no murmur, click, rub or gallop GI: soft, non-tender; bowel sounds normal; no masses,  no organomegaly Extremities: venous stasis dermatitis noted  Lab Results:    Basic Metabolic Panel:  Recent Labs  16/10/96 0457 12/01/12 0426  NA 140 141  K 4.2 3.9  CL 92* 96  CO2 37* 34*  GLUCOSE 123* 138*  BUN 47* 44*  CREATININE 0.88 0.95  CALCIUM 10.5 10.1   Liver Function Tests: No results found for this basename: AST, ALT, ALKPHOS, BILITOT, PROT, ALBUMIN,  in the last 72 hours No results found for this basename: LIPASE, AMYLASE,  in the last 72 hours No results found for this basename: AMMONIA,  in the last 72 hours CBC: No results found for this basename: WBC, NEUTROABS, HGB, HCT, MCV, PLT,  in the last 72 hours Cardiac Enzymes: No results found for this basename: CKTOTAL, CKMB, CKMBINDEX, TROPONINI,  in the last 72 hours BNP: No results found for this basename: PROBNP,  in the last 72 hours D-Dimer: No results found for this basename: DDIMER,  in the last 72 hours CBG:  Recent Labs  11/30/12 1659 11/30/12 2019 12/01/12 0029  12/01/12 0046 12/01/12 0307 12/01/12 0720  GLUCAP 123* 105* 140* 141* 150* 119*   Hemoglobin A1C: No results found for this basename: HGBA1C,  in the last 72 hours Fasting Lipid Panel:  Recent Labs  11/30/12 0456  TRIG 181*   Thyroid Function Tests: No results found for this basename: TSH, T4TOTAL, FREET4, T3FREE, THYROIDAB,  in the last 72 hours Anemia Panel: No results found for this basename: VITAMINB12, FOLATE, FERRITIN, TIBC, IRON, RETICCTPCT,  in the last 72 hours Coagulation: No results found for this basename: LABPROT, INR,  in the last 72 hours Urine Drug Screen: Drugs of Abuse  No results found for this basename: labopia, cocainscrnur, labbenz, amphetmu, thcu, labbarb    Alcohol Level: No results found for this basename: ETH,  in the last 72 hours Urinalysis: No results found for this basename: COLORURINE, APPERANCEUR, LABSPEC, PHURINE, GLUCOSEU, HGBUR, BILIRUBINUR, KETONESUR, PROTEINUR, UROBILINOGEN, NITRITE, LEUKOCYTESUR,  in the last 72 hours Misc. Labs:  ABGS  Recent Labs  12/01/12 0525  PHART 7.447  PO2ART 65.8*  TCO2 30.4  HCO3 33.5*   CULTURES Recent Results (from the past 240 hour(s))  CULTURE, RESPIRATORY (NON-EXPECTORATED)     Status: None   Collection Time    11/21/12  9:27 AM      Result Value Range Status   Specimen Description TRACHEAL ASPIRATE   Final   Special Requests NONE   Final   Gram Stain     Final   Value:  ABUNDANT WBC PRESENT, PREDOMINANTLY PMN     RARE SQUAMOUS EPITHELIAL CELLS PRESENT     MODERATE GRAM POSITIVE COCCI IN PAIRS AND CHAINS     Performed at [facility]   Culture     Final   Value: Non-Pathogenic Oropharyngeal-type Flora Isolated.     Performed at [facility]   Report Status 11/24/2012 FINAL   Final  CULTURE, BLOOD (ROUTINE X 2)     Status: None   Collection Time    11/21/12  9:30 AM      Result Value Range Status   Specimen Description BLOOD RIGHT HAND   Final   Special Requests BOTTLES  DRAWN AEROBIC AND ANAEROBIC 8CC   Final   Culture NO GROWTH 5 DAYS   Final   Report Status 11/26/2012 FINAL   Final  CULTURE, BLOOD (ROUTINE X 2)     Status: None   Collection Time    11/21/12  9:39 AM      Result Value Range Status   Specimen Description BLOOD LEFT HAND   Final   Special Requests BOTTLES DRAWN AEROBIC AND ANAEROBIC 8CC   Final   Culture NO GROWTH 5 DAYS   Final   Report Status 11/26/2012 FINAL   Final  CLOSTRIDIUM DIFFICILE BY PCR     Status: None   Collection Time    11/30/12  1:13 PM      Result Value Range Status   C difficile by pcr NEGATIVE  NEGATIVE Final   Studies/Results: Dg Chest Port 1 View  12/01/2012   CLINICAL DATA:  Respiratory failure  EXAM: PORTABLE CHEST - 1 VIEW  COMPARISON:  Prior chest x-ray 11/30/2012  FINDINGS: The endotracheal tube is approximately 3 cm above the carinal. A left upper extremity approach PICC in good position with the tip terminating in the mid SVC. Incompletely imaged nasogastric tube. The tip lies below the field of view, presumably within the stomach.  Unchanged marked enlargement of the cardiopericardial silhouette, low inspiratory volumes with layering left pleural effusion, pulmonary edema and left basilar atelectasis versus infiltrate. No significant interval change.  IMPRESSION: 1. No significant interval change in the appearance of the chest. 2. Stable and satisfactory support apparatus.   Electronically Signed   By: Malachy Moan M.D.   On: 12/01/2012 08:20   Dg Chest Port 1 View  11/30/2012   CLINICAL DATA:  Ventilator dependence.  EXAM: PORTABLE CHEST - 1 VIEW  COMPARISON:  11/29/2012  FINDINGS: 0551 hr. Patient is rotated to the right. Retrieved tube tip is approximately 4.5 cm above the base of the chronic. Lung volumes are low. There is left base atelectasis or infiltrate with small left pleural effusion. Vascular congestion noted without overt airspace pulmonary edema. There appears to be an NG tube in place although  it is almost imperceptible against the mediastinum and upper abdomen. It does appear did pass below the level of the esophagogastric junction. A left sided PICC line tip overlies the mid SVC level there is also difficult to discern against the background soft tissues.  IMPRESSION: Cardiomegaly with low volumes and vascular congestion.  Left base atelectasis or infiltrate with left effusion.  Left PICC line and NG tube are not well seen.   Electronically Signed   By: Kennith Center M.D.   On: 11/30/2012 07:46    Medications:  Scheduled: . ipratropium  0.5 mg Nebulization Q4H   And  . albuterol  2.5 mg Nebulization Q4H  . antiseptic oral rinse  15 mL Mouth Rinse QID  . aspirin  81 mg Oral Daily  . chlorhexidine  15 mL Mouth Rinse BID  . enoxaparin (LOVENOX) injection  100 mg Subcutaneous Q24H  . feeding supplement (PRO-STAT SUGAR FREE 64)  30 mL Per Tube QID  . feeding supplement (VITAL HIGH PROTEIN)  1,000 mL Per Tube Q24H  . insulin aspart  0-15 Units Subcutaneous Q4H  . methylPREDNISolone (SOLU-MEDROL) injection  40 mg Intravenous Q12H  . multivitamin  5 mL Oral Daily  . nicotine  21 mg Transdermal Daily  . pantoprazole (PROTONIX) IV  40 mg Intravenous Daily  . piperacillin-tazobactam (ZOSYN)  IV  3.375 g Intravenous Q8H  . potassium chloride  40 mEq Per Tube TID  . sodium chloride  10-40 mL Intracatheter Q12H  . sodium chloride  3 mL Intravenous Q12H  . vancomycin  1,250 mg Intravenous Q12H   Continuous: . sodium chloride 10 mL/hr at 11/29/12 1800  . fentaNYL infusion INTRAVENOUS 75 mcg/hr (12/01/12 0800)  . furosemide (LASIX) infusion 3 mg/hr (11/30/12 1058)  . propofol 35 mcg/kg/min (12/01/12 0901)   ZOX:WRUEAV chloride, acetaminophen, acetaminophen, fentaNYL, ondansetron (ZOFRAN) IV, sodium chloride, sodium chloride, sodium chloride, sodium chloride  Assesment: She was admitted with acute respiratory failure. She's now 12 days in. She had fever last night. She is requiring 70%  oxygen still. I think she's probably going to require tracheostomy and I discussed that with her husband. He has requested transfer to [facility] which I think is appropriate. Principal Problem:   Acute respiratory failure with hypoxia Active Problems:   Volume overload   Bilateral lower extremity edema   HTN (hypertension)   Normocytic anemia   Tobacco dependence   Acute diastolic congestive heart failure   CAP (community acquired pneumonia)   Sepsis    Plan: Transfer to critical care at [facility]    LOS: 12 days   Rogina Schiano L 12/01/2012, 9:02 AM

## 2012-12-01 NOTE — Progress Notes (Signed)
Pt had a bowel movement during the day per report, and had a very runny bowel movement overnight. May consider d/c miralax.

## 2012-12-01 NOTE — Progress Notes (Signed)
[facility] PROGRESS NOTE  Meghan Hoover WUJ:[id number] DOB: [birth year] DOA: 11/19/2012 PCP: Kirk Ruths, MD  Summary: 52 year old woman presented to the emergency department with increasing shortness of breath. Initial evaluation was notable for hypoxic respiratory failure, massive volume overload from feet up to abdomen suggesting CHF. Patient was initially admitted for antibiotics and diuresis, but within 48 hours required intubation for respiratory distress and hypoxia.  Patient has not been able to wean from the vent. She has been on a lasix infusion with excellent diuresis, but still has high oxygen requirements.  It is felt that she will need a tracheostomy and therefore arrangements have been made for her to transfer to [facility].  Assessment/Plan: 1. VDRF, acute respiratory acidosis: No significant change. I suspect the primary driving component is acute diastolic heart failure complicated by suspected obesity hypoventilation syndrome, suspected sleep apnea, suspected COPD. She completed a course of ceftriaxone/azithromycin for pneumonia, but then began developing fevers again yesterday.  Antibiotics were resumed with vancomycin and zosyn. Discussed with Dr. Juanetta Gosling and it is felt that patient will likely require tracheostomy. Due to her size and multiple comorbidities, it is felt that she would be better served at [facility]. Dr. Juanetta Gosling has discussed the case with Critical Care Medicine at [facility] who have accepted the patient in transfer. 2. Community acquired pneumonia: Recurring fevers yesterday, blood cultures repeated and have shown no growth. hemodynamically stable. Lactic acid was normal. Sputum culture was unremarkable. WBC count is normal. She is currently on antibiotics. 3. Acute diastolic congestive heart failure with anasarca: Complicated by pulmonary hypertension, suspected cor pulmonale, long-term smoking. Creatinine preserved. Lasix infusion titrated by  cardiology. Diuresis has been excellent on lasix infusion. 4. Anemia of critical illness: Overall appears stable. Transfuse for hemoglobin less than 7. 5. Hypokalemia: improved. 6. Hypertension: Stable. 7. Morbid obesity 8. Tobacco dependence: Patient has no desire to quit.  Code Status: full code DVT prophylaxis: Lovenox Family Communication: no family at bedside Disposition Plan: Transfer to [facility] for further treatments  Erick Blinks, MD  [facility]  Pager [phone] If 7PM-7AM, please contact night-coverage at [email or web address], password [facility] 12/01/2012, 9:39 AM  LOS: 12 days   Consultants:  Pulmonology  Cardiology  Procedures:  2-D echocardiogram: LVEF 65-70%; Grade 2 diastolic dysfunction. Moderate pulmonary HTN, possible cor pulmonale.  11/1: PICC line placement  Intubation 11/1  HPI/Subjective: Patient remains intubated and sedated on ventilator.  Objective: Filed Vitals:   12/01/12 0730 12/01/12 0759 12/01/12 0800 12/01/12 0830  BP: 95/57  103/57   Pulse: 62  61 67  Temp:  99.1 F (37.3 C)    TempSrc:  Axillary    Resp: 16  16 15   Height:   5\' 8"  (1.727 m)   Weight:   171.1 kg (377 lb 3.3 oz)   SpO2: 93%  91% 90%    Intake/Output Summary (Last 24 hours) at 12/01/12 0939 Last data filed at 12/01/12 0800  Gross per 24 hour  Intake 2047.7 ml  Output   4200 ml  Net -2152.3 ml     Filed Weights   11/30/12 0500 12/01/12 0500 12/01/12 0800  Weight: 173 kg (381 lb 6.3 oz) 181.5 kg (400 lb 2.2 oz) 171.1 kg (377 lb 3.3 oz)    Exam:   Afebrile, vital signs are stable. FiO2 70%.  General: Intubated, sedated, appears calm, morbidly obese  Eyes: Pupils equal, round, react to light, normal lids.  ENT: Lips appear unremarkable.  Cardiovascular: Regular rate and rhythm.  No murmur, rub or gallop. Marked decrease in bilateral lower extremity edema, 1+.  Respiratory: Clear to auscultation bilaterally. Fair air movement. No air trapping on  ventilator waveform. No frank wheezes, rales or rhonchi.  Abdomen: Obese, soft.  Skin: No new rash or induration seen.  Neurologic: Cannot assess  Psychiatric: Cannot assess  Scheduled Meds: . ipratropium  0.5 mg Nebulization Q4H   And  . albuterol  2.5 mg Nebulization Q4H  . antiseptic oral rinse  15 mL Mouth Rinse QID  . aspirin  81 mg Oral Daily  . chlorhexidine  15 mL Mouth Rinse BID  . enoxaparin (LOVENOX) injection  100 mg Subcutaneous Q24H  . feeding supplement (PRO-STAT SUGAR FREE 64)  30 mL Per Tube QID  . feeding supplement (VITAL HIGH PROTEIN)  1,000 mL Per Tube Q24H  . insulin aspart  0-15 Units Subcutaneous Q4H  . methylPREDNISolone (SOLU-MEDROL) injection  40 mg Intravenous Q12H  . multivitamin  5 mL Oral Daily  . nicotine  21 mg Transdermal Daily  . pantoprazole (PROTONIX) IV  40 mg Intravenous Daily  . piperacillin-tazobactam (ZOSYN)  IV  3.375 g Intravenous Q8H  . potassium chloride  40 mEq Per Tube TID  . sodium chloride  10-40 mL Intracatheter Q12H  . sodium chloride  3 mL Intravenous Q12H  . vancomycin  1,250 mg Intravenous Q12H   Continuous Infusions: . sodium chloride 10 mL/hr at 11/29/12 1800  . fentaNYL infusion INTRAVENOUS 75 mcg/hr (12/01/12 0800)  . furosemide (LASIX) infusion 3 mg/hr (11/30/12 1058)  . propofol 35 mcg/kg/min (12/01/12 0901)    Principal Problem:   Acute respiratory failure with hypoxia Active Problems:   Volume overload   Bilateral lower extremity edema   HTN (hypertension)   Normocytic anemia   Tobacco dependence   Acute diastolic congestive heart failure   CAP (community acquired pneumonia)   Sepsis   Time spent 25 minutes

## 2012-12-01 NOTE — Progress Notes (Signed)
UR chart review completed.  

## 2012-12-01 NOTE — Progress Notes (Addendum)
NUTRITION FOLLOW UP / CONSULT  Intervention:    Continue Vital HP @ 10 ml/hr, increase Prostat to 60 ml TID for intake of 840 kcals, 111 gm protein, 201 ml free water daily  Above TF regimen plus Propofol will provide a total of 1928 kcals (71% of estimated needs), 111 gm protein (70% of estimated needs), and 201 ml free water daily.  Unable to meet 100% of protein needs at this time due to Propofol.  Nutrition Dx:   Inadequate oral intake related to inability to eat as evidenced by NPO status. Ongoing.   Goal:   Enteral nutrition to provide 60-70% of estimated calorie needs (22-25 kcals/kg ideal body weight) and 100% of estimated protein needs, based on ASPEN guidelines for permissive underfeeding in critically ill obese individuals. Progressing.  Monitor:    TF tolerance/adequacy, weight trend, labs, vent status.  Assessment:   Patient is a 52 year old female, current smoker with PMH of HTN; presented to [facility] ED on 10/30 with shortness of breath and massive edema. Patient admitted to [facility] ICU and treated for CHF and CAP. Patient was transferred to [facility] on 11/11 for trach and further care.  Has been receiving TF via OGT with Vital HP at 10 ml/h with Prostat 30 ml QID to provide 640 kcals, 81 gm protein, 201 ml free water daily. Was also on a high rate of Propofol to meet the remainder of her calorie needs. TF and Propofol continues.  Nutrition focused physical exam completed.  No muscle or subcutaneous fat depletion noticed.  Patient is currently intubated on ventilator support.  MV: 8.4 L/min Temp:Temp (24hrs), Avg:99.6 F (37.6 C), Min:98.6 F (37 C), Max:100.5 F (38.1 C)  Propofol: 41.2 ml/h providing 1088 kcals per day.   Height: Ht Readings from Last 1 Encounters:  12/01/12 5\' 8"  (1.727 m)    Weight Status:   Wt Readings from Last 1 Encounters:  12/01/12 377 lb 3.3 oz (171.1 kg)  Admission wt. 380 lb on 10/30  Body mass index is 57.37 kg/(m^2).  Re-estimated  needs:  Kcal: 2685 Protein:159 gm  Fluid: 2.4-2.6 L  Skin: stage III pressure ulcer on right ankle  Diet Order: NPO   Intake/Output Summary (Last 24 hours) at 12/01/12 1231 Last data filed at 12/01/12 1100  Gross per 24 hour  Intake 2134.4 ml  Output   4450 ml  Net -2315.6 ml    Last BM: 11/11   Labs:   Recent Labs Lab 11/25/12 0430  11/29/12 0511 11/30/12 0457 12/01/12 0426  NA 140  < > 136 140 141  K 3.8  < > 4.0 4.2 3.9  CL 93*  < > 89* 92* 96  CO2 41*  < > 40* 37* 34*  BUN 15  < > 41* 47* 44*  CREATININE 0.83  < > 0.80 0.88 0.95  CALCIUM 9.3  < > 10.6* 10.5 10.1  MG 2.1  --   --   --   --   GLUCOSE 152*  < > 127* 123* 138*  < > = values in this interval not displayed.  CBG (last 3)   Recent Labs  12/01/12 0307 12/01/12 0720 12/01/12 1103  GLUCAP 150* 119* 130*    Scheduled Meds: . antiseptic oral rinse  15 mL Mouth Rinse QID  . aspirin  81 mg Oral Daily  . chlorhexidine  15 mL Mouth Rinse BID  . enoxaparin (LOVENOX) injection  100 mg Subcutaneous Q24H  . feeding supplement (PRO-STAT SUGAR  FREE 64)  30 mL Per Tube QID  . feeding supplement (VITAL HIGH PROTEIN)  1,000 mL Per Tube Q24H  . insulin aspart  2-6 Units Subcutaneous Q4H  . Ipratropium-Albuterol  6 puff Inhalation Q4H  . methylPREDNISolone (SOLU-MEDROL) injection  40 mg Intravenous Q12H  . multivitamin  5 mL Oral Daily  . nicotine  21 mg Transdermal Daily  . pantoprazole (PROTONIX) IV  40 mg Intravenous Daily  . piperacillin-tazobactam (ZOSYN)  IV  3.375 g Intravenous Q8H  . potassium chloride  40 mEq Per Tube TID  . potassium chloride  40 mEq Per Tube TID  . sodium chloride  10-40 mL Intracatheter Q12H  . sodium chloride  3 mL Intravenous Q12H  . vancomycin  1,250 mg Intravenous Q12H    Continuous Infusions: . sodium chloride 10 mL/hr at 11/29/12 1800  . fentaNYL infusion INTRAVENOUS 100 mcg/hr (12/01/12 1218)  . furosemide (LASIX) infusion    . [START ON 12/02/2012] furosemide  (LASIX) infusion    . propofol 45 mcg/kg/min (12/01/12 1220)    Joaquin Courts, RD, LDN, CNSC Pager [phone] After Hours Pager [phone]

## 2012-12-01 NOTE — Progress Notes (Signed)
PT BEING TRANSFER TO [facility] FOR ANTICIPATED TRACHEOSTOMY.

## 2012-12-02 ENCOUNTER — Encounter (HOSPITAL_COMMUNITY): Payer: Self-pay | Admitting: Radiology

## 2012-12-02 ENCOUNTER — Inpatient Hospital Stay (HOSPITAL_COMMUNITY): Payer: BC Managed Care – PPO

## 2012-12-02 LAB — BASIC METABOLIC PANEL
BUN: 39 mg/dL — ABNORMAL HIGH (ref 6–23)
CO2: 31 mEq/L (ref 19–32)
Calcium: 9.7 mg/dL (ref 8.4–10.5)
Creatinine, Ser: 0.82 mg/dL (ref 0.50–1.10)
GFR calc non Af Amer: 80 mL/min — ABNORMAL LOW (ref >90)
Glucose, Bld: 152 mg/dL — ABNORMAL HIGH (ref 70–99)
Potassium: 3.8 mEq/L (ref 3.5–5.1)

## 2012-12-02 LAB — URINE CULTURE

## 2012-12-02 LAB — POCT I-STAT 3, ART BLOOD GAS (G3+)
Acid-Base Excess: 9 mmol/L — ABNORMAL HIGH (ref 0.0–2.0)
Patient temperature: 98.3
TCO2: 36 mmol/L (ref 0–100)
pCO2 arterial: 50.4 mmHg — ABNORMAL HIGH (ref 35.0–45.0)
pH, Arterial: 7.444 (ref 7.350–7.450)

## 2012-12-02 LAB — CBC
Hemoglobin: 10.9 g/dL — ABNORMAL LOW (ref 12.0–15.0)
MCH: 21.9 pg — ABNORMAL LOW (ref 26.0–34.0)
MCHC: 28.5 g/dL — ABNORMAL LOW (ref 30.0–36.0)
Platelets: 281 10*3/uL (ref 150–400)
RBC: 4.97 MIL/uL (ref 3.87–5.11)
RDW: 21.4 % — ABNORMAL HIGH (ref 11.5–15.5)

## 2012-12-02 LAB — GLUCOSE, CAPILLARY
Glucose-Capillary: 154 mg/dL — ABNORMAL HIGH (ref 70–99)
Glucose-Capillary: 117 mg/dL — ABNORMAL HIGH (ref 70–99)
Glucose-Capillary: 124 mg/dL — ABNORMAL HIGH (ref 70–99)
Glucose-Capillary: 120 mg/dL — ABNORMAL HIGH (ref 70–99)

## 2012-12-02 LAB — BLOOD GAS, ARTERIAL
Bicarbonate: 32.8 mEq/L — ABNORMAL HIGH (ref 20.0–24.0)
PEEP: 16 cmH2O
Patient temperature: 98.6
RATE: 16 resp/min
pCO2 arterial: 55.1 mmHg — ABNORMAL HIGH (ref 35.0–45.0)
pH, Arterial: 7.392 (ref 7.350–7.450)
pO2, Arterial: 86.3 mmHg (ref 80.0–100.0)

## 2012-12-02 LAB — PROCALCITONIN: Procalcitonin: <0.1 ng/mL

## 2012-12-02 LAB — CORTISOL: Cortisol, Plasma: 3.2 ug/dL

## 2012-12-02 LAB — MAGNESIUM: Magnesium: 2.6 mg/dL — ABNORMAL HIGH (ref 1.5–2.5)

## 2012-12-02 MED ORDER — FENTANYL CITRATE 0.05 MG/ML IJ SOLN: 200.0000 ug | Freq: Once | INTRAMUSCULAR | Status: DC

## 2012-12-02 MED ORDER — METHYLPREDNISOLONE SODIUM SUCC 40 MG IJ SOLR
40.0000 mg | INTRAMUSCULAR | Status: DC
  Administered 2012-12-03: 40 mg via INTRAVENOUS
  Filled 2012-12-02 (×2): qty 1

## 2012-12-02 MED ORDER — VITAL HIGH PROTEIN PO LIQD
1000.0000 mL | ORAL | Status: DC
  Administered 2012-12-02 – 2012-12-09 (×6): 1000 mL
  Filled 2012-12-02 (×14): qty 1000

## 2012-12-02 MED ORDER — IOHEXOL 350 MG/ML SOLN
80.0000 mL | Freq: Once | INTRAVENOUS | Status: AC | PRN
  Administered 2012-12-02: 60 mL via INTRAVENOUS

## 2012-12-02 MED ORDER — HYDROCORTISONE SOD SUCCINATE 100 MG IJ SOLR
50.0000 mg | Freq: Four times a day (QID) | INTRAMUSCULAR | Status: DC
  Administered 2012-12-02: 50 mg via INTRAVENOUS
  Filled 2012-12-02 (×4): qty 1

## 2012-12-02 MED ORDER — PRO-STAT SUGAR FREE PO LIQD
30.0000 mL | Freq: Two times a day (BID) | ORAL | Status: DC
  Administered 2012-12-02 – 2012-12-09 (×13): 30 mL
  Filled 2012-12-02 (×15): qty 30

## 2012-12-02 MED ORDER — FUROSEMIDE 10 MG/ML IJ SOLN
40.0000 mg | Freq: Three times a day (TID) | INTRAMUSCULAR | Status: AC
  Administered 2012-12-02 – 2012-12-03 (×4): 40 mg via INTRAVENOUS
  Filled 2012-12-02 (×3): qty 4

## 2012-12-02 MED ORDER — MIDAZOLAM HCL 2 MG/2ML IJ SOLN
4.0000 mg | Freq: Once | INTRAMUSCULAR | Status: AC
  Administered 2012-12-03: 4 mg via INTRAVENOUS
  Filled 2012-12-02: qty 4

## 2012-12-02 MED ORDER — ENOXAPARIN SODIUM 80 MG/0.8ML ~~LOC~~ SOLN
80.0000 mg | SUBCUTANEOUS | Status: DC
  Administered 2012-12-02 – 2012-12-08 (×7): 80 mg via SUBCUTANEOUS
  Filled 2012-12-02 (×8): qty 0.8

## 2012-12-02 MED ORDER — ETOMIDATE 2 MG/ML IV SOLN
40.0000 mg | Freq: Once | INTRAVENOUS | Status: AC
  Administered 2012-12-03: 10 mg via INTRAVENOUS
  Filled 2012-12-02: qty 20

## 2012-12-02 MED ORDER — PROPOFOL 10 MG/ML IV EMUL
5.0000 ug/kg/min | Freq: Once | INTRAVENOUS | Status: AC
Start: 2012-12-02 — End: 2012-12-03
  Administered 2012-12-03: 20 ug/kg/min via INTRAVENOUS
  Filled 2012-12-02: qty 100

## 2012-12-02 MED ORDER — VECURONIUM BROMIDE 10 MG IV SOLR
10.0000 mg | Freq: Once | INTRAVENOUS | Status: AC
  Administered 2012-12-03: 10 mg via INTRAVENOUS
  Filled 2012-12-02: qty 10

## 2012-12-02 MED ORDER — POTASSIUM CHLORIDE 20 MEQ/15ML (10%) PO LIQD
40.0000 meq | Freq: Once | ORAL | Status: AC
  Administered 2012-12-02: 40 meq
  Filled 2012-12-02: qty 30

## 2012-12-02 NOTE — Progress Notes (Addendum)
PULMONARY  / CRITICAL CARE MEDICINE  Name: Meghan Hoover MRN: [id number] DOB: 18-Oct-1960    ADMISSION DATE:  11/19/2012 CONSULTATION DATE:  12/01/12  REFERRING MD :  Dr. Kerry Hough PRIMARY SERVICE: PCCM  CHIEF COMPLAINT:  Shortness of breath  BRIEF PATIENT DESCRIPTION: 52 y.o. Current smoker  With PMH of HTN presented to [facility] ED 10/30 with shortness of breath and massive edema.  Patient admitted to [facility] ICU and treated for CHF and CAP.  Patient to be transferred to [facility] for trach and further care.    SIGNIFICANT EVENTS / STUDIES:  10/30 admitted [facility] for fluid overload and respiratory failure. 11/11 transfer to [facility] for tracheostomy and further care.  LINES / TUBES: L [place] TLC 11/10>>> PICC line unknown placement date>>>11/10 ET tube 10/30>>> NGT 10/30>>>  CULTURES: C. Diff 11/10>>>NTD. Urine 11/10>>>NTD Blood 11/10>>>NTD Blood 11/11>>>NTD Sputum 11/1>>>NTD  ANTIBIOTICS: Vanc 11/1-11/7, 11/10>>> Zosyn 11/11>>>  SUBJECTIVE: AF, comfortable on vent  VITAL SIGNS: Temp:  [98.5 F (36.9 C)-99.7 F (37.6 C)] 98.6 F (37 C) (11/12 0817) Pulse Rate:  [59-88] 71 (11/12 0800) Resp:  [13-21] 18 (11/12 0800) BP: (97-137)/(33-89) 104/53 mmHg (11/12 0700) SpO2:  [91 %-98 %] 97 % (11/12 0800) FiO2 (%):  [70 %-80 %] 70 % (11/12 0855) Weight:  [370 lb 6 oz (168 kg)] 370 lb 6 oz (168 kg) (11/12 0500)  HEMODYNAMICS: CVP:  [12 mmHg-23 mmHg] 16 mmHg  VENTILATOR SETTINGS: Vent Mode:  [-] PRVC FiO2 (%):  [70 %-80 %] 70 % Set Rate:  [16 bmp] 16 bmp Vt Set:  [550 mL] 550 mL PEEP:  [10 cmH20-16 cmH20] 14 cmH20 Plateau Pressure:  [27 cmH20-34 cmH20] 32 cmH20  INTAKE / OUTPUT: Intake/Output     11/11 0701 - 11/12 0700 11/12 0701 - 11/13 0700   I.V. (mL/kg) 1366.6 (8.1) 14 (0.1)   Other 475    NG/GT 760 10   IV Piggyback 612.5 12.5   Total Intake(mL/kg) 3214.1 (19.1) 36.5 (0.2)   Urine (mL/kg/hr) 3070 (0.8) 350 (0.7)   Stool 1850 (0.5)    Total Output 4920 350   Net -1706 -313.5          PHYSICAL EXAMINATION: General:  Chronically ill appearing morbidly obese female. Neuro:  Arousable, follows some commands, moves all ext to command. HEENT:  [place]/AT, PERRL, EOM-I and MMM. Cardiovascular:  RRR, Nl S1/S2, -M/R/G. Lungs:  Diffuse crackles, coarse BS. Abdomen:  Soft, NT, ND and +BS. Musculoskeletal:  Diffuse edema 2+. Skin:  Intact.  LABS:  Recent Labs Lab 11/26/12 0435  12/01/12 0426 12/01/12 0525 12/01/12 1144 12/01/12 1145 12/01/12 1216 12/02/12 0415 12/02/12 0445  HGB 10.0*  --   --   --  11.6*  --   --   --  10.9*  WBC 9.6  --   --   --  19.1*  --   --   --  17.2*  PLT 286  --   --   --  300  --   --   --  281  NA 143  < > 141  --  137  --   --   --  141  K 4.1  < > 3.9  --  4.1  --   --   --  3.8  CL 97  < > 96  --  96  --   --   --  97  CO2 40*  < > 34*  --  29  --   --   --  31  GLUCOSE 162*  < > 138*  --  117*  --   --   --  152*  BUN 23  < > 44*  --  39*  --   --   --  39*  CREATININE 0.81  < > 0.95  --  0.87  --   --   --  0.82  CALCIUM 9.6  < > 10.1  --  9.8  --   --   --  9.7  MG  --   --   --   --  2.8*  --   --   --  2.6*  PHOS  --   --   --   --  6.4*  --   --   --  4.8*  AST  --   --   --   --  12  --   --   --   --   ALT  --   --   --   --  14  --   --   --   --   ALKPHOS  --   --   --   --  66  --   --   --   --   BILITOT  --   --   --   --  0.6  --   --   --   --   PROT  --   --   --   --  7.5  --   --   --   --   ALBUMIN  --   --   --   --  3.3*  --   --   --   --   APTT  --   --   --   --  21*  --   --   --   --   INR  --   --   --   --  1.06  --   --   --   --   LATICACIDVEN  --   --   --   --   --  0.9  --   --   --   PROBNP  --   --   --   --  454.0*  --   --   --   --   PHART  --   < >  --  7.447  --   --  7.374 7.392  --   PCO2ART  --   < >  --  49.3*  --   --  60.5* 55.1*  --   PO2ART  --   < >  --  65.8*  --   --  73.0* 86.3  --   < > = values in this interval not displayed.  Recent Labs Lab 12/01/12 1631  12/01/12 1934 12/02/12 0029 12/02/12 0406 12/02/12 0807  GLUCAP 135* 116* 126* 154* 117*    CXR: 11/12 - ETT in good position.  Improved ATX, improved edema.  ASSESSMENT / PLAN:  PULMONARY A:  VDRF -  Secondary to fluid overload with CHF and anasarca.  Concern was for PNA on presentation.  Exact date of starting abx is unknown. Mild resp acidosis per 11/12 ABG osa? P:   - peep to goal 10 if able, to goal 70% -abg reviewed, maintain current MV needs -neg balance goals, tolerated thus far -trach when able -vent needs out of proportion to pcxr, r/o pa htn,  pe?, consider spiral ct Fixed shunt??peep reduce re eval  CARDIOVASCULAR A:  CHF by history. P:  - 2D echo - Diureses as able, continue same dose as last 24 hrs  RENAL A: Anasarca, renal function holding. P:   - Lasix 4 mg/hr, did well overall, if hemodynamics ok, will transition off to int lasix - Replace K - Trend BMP -kvo  GASTROINTESTINAL A: Obese P:   - OGT. - TF per nutrition, increase to goal  HEMATOLOGIC A:   No active issues. P:  - Trend CBC  INFECTIOUS A:   ? HCAP P:   - Cont zosyn, no defined infiltrates, consider dc vanc - f/u cultures -pct algo to dc all abx  ENDOCRINE A:   DM Relative AI - cortisol 3.2 inaccurate and not in shock P:   - SSI - maintain solumedral but reduce -if drops bp, add florinef  NEUROLOGIC A:   No active issues. P:   - Propofol dc as drop in bp -fentanyl Mobilize as able  Rutherford Guys, PA - S  Ccm time 30 min   I have fully examined this patient and agree with above findings.    And edited in full  Mcarthur Rossetti. Tyson Alias, MD, FACP Pgr: [phone]  [place] [facility]

## 2012-12-02 NOTE — Progress Notes (Signed)
Chaplain offered emotional and spiritual support to pt and her family. Pt was awake, but intubated. Pt's daughter said she was uncomfortable with the breathing tube. Pt's husband explained they had arrived at [facility] from [facility] yesterday. Pt's family did not need chaplain support at this time but said they were grateful for the availability. Will follow up - please page if needed.   Maurene Capes [phone]

## 2012-12-02 NOTE — Progress Notes (Signed)
UR Completed.  Allyana Vogan Jane [phone] 12/02/2012  

## 2012-12-02 NOTE — Progress Notes (Signed)
NUTRITION FOLLOW UP  Intervention:    Continue Vital HP via OGT, increase by 10 ml every 4 hours to goal rate of 65 ml/h with Prostat 30 ml BID to provide 1760 kcals (69% estimated needs), 167 gm protein (100% of estimated needs), 1304 ml free water daily.  Nutrition Dx:   Inadequate oral intake related to inability to eat as evidenced by NPO status. Ongoing.   Goal:   Enteral nutrition to provide 60-70% of estimated calorie needs (22-25 kcals/kg ideal body weight) and 100% of estimated protein needs, based on ASPEN guidelines for permissive underfeeding in critically ill obese individuals. Progressing.  Monitor:    TF tolerance/adequacy, weight trend, labs, vent status.  Assessment:   Patient is a 52 year old female, current smoker with PMH of HTN; presented to [facility] ED on 10/30 with shortness of breath and massive edema. Patient admitted to [facility] ICU and treated for CHF and CAP. Patient was transferred to [facility] on 11/11 for trach and further care.  Has been receiving TF via OGT with Vital HP at 10 ml/h with Prostat 60 ml TID to provide 840 kcals, 111 gm protein, 201 ml free water daily.   Has also been on a high rate of Propofol to meet the remainder of her calorie needs. Discussed patient in ICU rounds today. Patient is no longer receiving Propofol. Weight is trending down with negative fluid balance.  Patient is currently intubated on ventilator support.  MV: 8.2 L/min Temp:Temp (24hrs), Avg:98.8 F (37.1 C), Min:98.5 F (36.9 C), Max:99.3 F (37.4 C)   Height: Ht Readings from Last 1 Encounters:  12/01/12 5\' 8"  (1.727 m)    Weight Status:   Wt Readings from Last 1 Encounters:  12/02/12 370 lb 6 oz (168 kg)  12/01/12  377 lb 3.3 oz (171.1 kg)  Admission wt. 380 lb on 10/30  Body mass index is 56.33 kg/(m^2).  Re-estimated needs:  Kcal: 2540 Protein:159 gm  Fluid: 2.4-2.6 L  Skin: stage III pressure ulcer on right ankle  Diet Order: NPO   Intake/Output Summary  (Last 24 hours) at 12/02/12 1154 Last data filed at 12/02/12 1100  Gross per 24 hour  Intake 3210.15 ml  Output   4770 ml  Net -1559.85 ml    Last BM: 11/12   Labs:   Recent Labs Lab 12/01/12 0426 12/01/12 1144 12/02/12 0445  NA 141 137 141  K 3.9 4.1 3.8  CL 96 96 97  CO2 34* 29 31  BUN 44* 39* 39*  CREATININE 0.95 0.87 0.82  CALCIUM 10.1 9.8 9.7  MG  --  2.8* 2.6*  PHOS  --  6.4* 4.8*  GLUCOSE 138* 117* 152*    CBG (last 3)   Recent Labs  12/02/12 0029 12/02/12 0406 12/02/12 0807  GLUCAP 126* 154* 117*    Scheduled Meds: . ipratropium  0.5 mg Nebulization Q4H   And  . albuterol  2.5 mg Nebulization Q4H  . antiseptic oral rinse  15 mL Mouth Rinse QID  . aspirin  81 mg Oral Daily  . chlorhexidine  15 mL Mouth Rinse BID  . enoxaparin (LOVENOX) injection  100 mg Subcutaneous Q24H  . feeding supplement (PRO-STAT SUGAR FREE 64)  60 mL Per Tube TID  . feeding supplement (VITAL HIGH PROTEIN)  1,000 mL Per Tube Q24H  . insulin aspart  2-6 Units Subcutaneous Q4H  . methylPREDNISolone (SOLU-MEDROL) injection  40 mg Intravenous Q12H  . multivitamin  5 mL Oral Daily  .  pantoprazole (PROTONIX) IV  40 mg Intravenous Daily  . piperacillin-tazobactam (ZOSYN)  IV  3.375 g Intravenous Q8H  . sodium chloride  10-40 mL Intracatheter Q12H  . sodium chloride  3 mL Intravenous Q12H  . vancomycin  1,250 mg Intravenous Q12H    Continuous Infusions: . sodium chloride 10 mL/hr at 12/02/12 0634  . fentaNYL infusion INTRAVENOUS 100 mcg/hr (12/01/12 1218)  . furosemide (LASIX) infusion 4 mg/hr (12/01/12 1230)  . furosemide (LASIX) infusion    . propofol 10 mcg/kg/min (12/02/12 1610)    Joaquin Courts, RD, LDN, CNSC Pager [phone] After Hours Pager [phone]

## 2012-12-03 ENCOUNTER — Inpatient Hospital Stay (HOSPITAL_COMMUNITY): Payer: BC Managed Care – PPO

## 2012-12-03 LAB — BLOOD GAS, ARTERIAL
Acid-Base Excess: 9.8 mmol/L — ABNORMAL HIGH (ref 0.0–2.0)
Bicarbonate: 33.6 mEq/L — ABNORMAL HIGH (ref 20.0–24.0)
Drawn by: 31101
FIO2: 0.7 %
PEEP: 12 cmH2O
RATE: 20 resp/min
pCO2 arterial: 44.2 mmHg (ref 35.0–45.0)
pH, Arterial: 7.494 — ABNORMAL HIGH (ref 7.350–7.450)
pO2, Arterial: 69.3 mmHg — ABNORMAL LOW (ref 80.0–100.0)

## 2012-12-03 LAB — CBC
HCT: 41 % (ref 36.0–46.0)
Hemoglobin: 11.8 g/dL — ABNORMAL LOW (ref 12.0–15.0)
MCH: 22.3 pg — ABNORMAL LOW (ref 26.0–34.0)
MCV: 77.5 fL — ABNORMAL LOW (ref 78.0–100.0)
RBC: 5.29 MIL/uL — ABNORMAL HIGH (ref 3.87–5.11)
WBC: 13.3 10*3/uL — ABNORMAL HIGH (ref 4.0–10.5)

## 2012-12-03 LAB — CULTURE, RESPIRATORY W GRAM STAIN

## 2012-12-03 LAB — GLUCOSE, CAPILLARY
Glucose-Capillary: 107 mg/dL — ABNORMAL HIGH (ref 70–99)
Glucose-Capillary: 110 mg/dL — ABNORMAL HIGH (ref 70–99)
Glucose-Capillary: 113 mg/dL — ABNORMAL HIGH (ref 70–99)
Glucose-Capillary: 155 mg/dL — ABNORMAL HIGH (ref 70–99)

## 2012-12-03 LAB — CULTURE, RESPIRATORY: Special Requests: NORMAL

## 2012-12-03 LAB — BASIC METABOLIC PANEL
BUN: 35 mg/dL — ABNORMAL HIGH (ref 6–23)
Chloride: 100 mEq/L (ref 96–112)
Chloride: 103 mEq/L (ref 96–112)
Creatinine, Ser: 0.79 mg/dL (ref 0.50–1.10)
GFR calc Af Amer: >90 mL/min (ref >90)
GFR calc Af Amer: >90 mL/min (ref >90)
GFR calc non Af Amer: >90 mL/min (ref >90)
GFR calc non Af Amer: >90 mL/min (ref >90)
Glucose, Bld: 108 mg/dL — ABNORMAL HIGH (ref 70–99)
Potassium: 2.5 mEq/L — CL (ref 3.5–5.1)
Potassium: 3.5 mEq/L (ref 3.5–5.1)
Sodium: 145 mEq/L (ref 135–145)
Sodium: 148 mEq/L — ABNORMAL HIGH (ref 135–145)

## 2012-12-03 LAB — POCT I-STAT 3, ART BLOOD GAS (G3+)
Bicarbonate: 35.4 mEq/L — ABNORMAL HIGH (ref 20.0–24.0)
O2 Saturation: 96 %
Patient temperature: 98.6
pCO2 arterial: 65.1 mmHg (ref 35.0–45.0)
pH, Arterial: 7.344 — ABNORMAL LOW (ref 7.350–7.450)
pO2, Arterial: 92 mmHg (ref 80.0–100.0)

## 2012-12-03 LAB — PROCALCITONIN: Procalcitonin: 0.12 ng/mL

## 2012-12-03 MED ORDER — PROPOFOL 10 MG/ML IV EMUL
INTRAVENOUS | Status: AC
  Filled 2012-12-03: qty 100

## 2012-12-03 MED ORDER — WHITE PETROLATUM GEL
Status: AC
  Administered 2012-12-03: 0.2
  Filled 2012-12-03: qty 5

## 2012-12-03 MED ORDER — FENTANYL CITRATE 0.05 MG/ML IJ SOLN: 25.0000 ug | INTRAMUSCULAR | Status: AC | PRN

## 2012-12-03 MED ORDER — POTASSIUM CHLORIDE 20 MEQ/15ML (10%) PO LIQD
40.0000 meq | ORAL | Status: AC
  Administered 2012-12-03 (×3): 40 meq
  Filled 2012-12-03 (×3): qty 30

## 2012-12-03 NOTE — Progress Notes (Signed)
[facility] ADULT ICU REPLACEMENT PROTOCOL FOR AM LAB REPLACEMENT ONLY  The patient does apply for the [facility] Adult ICU Electrolyte Replacment Protocol based on the criteria listed below:   1. Is GFR >/= 40 ml/min? yes  Patient's GFR today is >90 2. Is urine output >/= 0.5 ml/kg/hr for the last 6 hours? yes Patient's UOP is 1.5 ml/kg/hr 3. Is BUN < 60 mg/dL? yes  Patient's BUN today is 34 4. Abnormal electrolyte(s): K 2.5 5. Ordered repletion with: per protocol 6. If a panic level lab has been reported, has the CCM MD in charge been notified? yes.   Physician:  Dr Pura Spice, Brydan Downard A 12/03/2012 5:29 AM

## 2012-12-03 NOTE — Procedures (Signed)
Perc trach 6 placed Tolerated well  Blood loss 10 cc See full dictation Mcarthur Rossetti. Tyson Alias, MD, FACP Pgr: [phone] [place] [facility]

## 2012-12-03 NOTE — Progress Notes (Signed)
Vent changes made per MD order. RT will monitor.  

## 2012-12-03 NOTE — Progress Notes (Signed)
PULMONARY  / CRITICAL CARE MEDICINE  Name: TALISHA ERBY MRN: [id number] DOB: 16-Jun-1960    ADMISSION DATE:  11/19/2012 CONSULTATION DATE:  12/01/12  REFERRING MD :  Dr. Kerry Hough PRIMARY SERVICE: PCCM  CHIEF COMPLAINT:  Shortness of breath  BRIEF PATIENT DESCRIPTION: 52 y.o. Current smoker  With PMH of HTN presented to [facility] ED 10/30 with shortness of breath and massive edema.  Patient admitted to [facility] ICU and treated for CHF and CAP.  Patient to be transferred to [facility] for trach and further care.    SIGNIFICANT EVENTS / STUDIES:  10/30 admitted [facility] for fluid overload and respiratory failure. 11/11 transfer to [facility] for tracheostomy and further care. 11/12 Chest CTA >>> neg for PE, volume loss bilateral lower lobes, cholelithiasis.  LINES / TUBES: L [place] TLC 11/10>>> PICC line unknown placement date>>>11/10 ET tube 10/30>>> NGT 10/30>>> Foley 10/30 >>> Rectal tube 11/11>>>  CULTURES: C. Diff 11/10>>>NTD. Urine 11/10>>>neg Blood 11/10>>>NTD Blood 11/11>>>NTD Sputum 11/1>>>neg  ANTIBIOTICS: Vanc 11/1-11/7, 11/10>>> 11/12 Zosyn 11/11>>>  SUBJECTIVE: AF, comfortable on vent, neg 4 liters  VITAL SIGNS: Temp:  [97.9 F (36.6 C)-98.7 F (37.1 C)] 98.1 F (36.7 C) (11/13 0835) Pulse Rate:  [53-78] 64 (11/13 0800) Resp:  [12-22] 20 (11/13 0800) BP: (118-167)/(64-107) 127/79 mmHg (11/13 0800) SpO2:  [89 %-96 %] 92 % (11/13 0800) FiO2 (%):  [60 %-70 %] 60 % (11/13 0800)  HEMODYNAMICS: CVP:  [11 mmHg-14 mmHg] 12 mmHg  VENTILATOR SETTINGS: Vent Mode:  [-] PRVC FiO2 (%):  [60 %-70 %] 60 % Set Rate:  [16 bmp-20 bmp] 20 bmp Vt Set:  [550 mL] 550 mL PEEP:  [10 cmH20-12 cmH20] 10 cmH20 Plateau Pressure:  [20 cmH20-30 cmH20] 26 cmH20  INTAKE / OUTPUT: Intake/Output     11/12 0701 - 11/13 0700 11/13 0701 - 11/14 0700   I.V. (mL/kg) 756.3 (4.5) 15 (0.1)   Other 30    NG/GT 790 50   IV Piggyback 187.5    Total Intake(mL/kg) 1763.8 (10.5) 65 (0.4)   Urine (mL/kg/hr) 5437  (1.3) 1000 (2.2)   Stool 650 (0.2)    Total Output 6087 1000   Net -4323.3 -935         PHYSICAL EXAMINATION: General:  Chronically ill appearing morbidly obese female. Neuro:  awake, follows some commands, moves all ext to command. HEENT:  [place]/AT, PERRL, EOM-I and MMM. Cardiovascular:  RRR, No M/R/G appreciated with respect to body habitus. Lungs:  Coarse breath sounds throughout. Abdomen:  Soft, NT, ND and +BS. Musculoskeletal:  Minimal edema, non-pitting. Skin:  Intact.  LABS:  Recent Labs Lab 12/01/12 0426  12/01/12 1144 12/01/12 1145  12/02/12 0415 12/02/12 0445 12/02/12 1242 12/02/12 1410 12/03/12 0336 12/03/12 0430  HGB  --   --  11.6*  --   --   --  10.9*  --   --   --  11.8*  WBC  --   --  19.1*  --   --   --  17.2*  --   --   --  13.3*  PLT  --   --  300  --   --   --  281  --   --   --  314  NA 141  --  137  --   --   --  141  --   --   --  145  K 3.9  --  4.1  --   --   --  3.8  --   --   --  2.5*  CL 96  --  96  --   --   --  97  --   --   --  100  CO2 34*  --  29  --   --   --  31  --   --   --  33*  GLUCOSE 138*  --  117*  --   --   --  152*  --   --   --  108*  BUN 44*  --  39*  --   --   --  39*  --   --   --  34*  CREATININE 0.95  --  0.87  --   --   --  0.82  --   --   --  0.78  CALCIUM 10.1  --  9.8  --   --   --  9.7  --   --   --  9.9  MG  --   --  2.8*  --   --   --  2.6*  --   --   --   --   PHOS  --   --  6.4*  --   --   --  4.8*  --   --   --   --   AST  --   --  12  --   --   --   --   --   --   --   --   ALT  --   --  14  --   --   --   --   --   --   --   --   ALKPHOS  --   --  66  --   --   --   --   --   --   --   --   BILITOT  --   --  0.6  --   --   --   --   --   --   --   --   PROT  --   --  7.5  --   --   --   --   --   --   --   --   ALBUMIN  --   --  3.3*  --   --   --   --   --   --   --   --   APTT  --   --  21*  --   --   --   --   --   --   --   --   INR  --   --  1.06  --   --   --   --   --   --   --   --   LATICACIDVEN  --    --   --  0.9  --   --   --   --   --   --   --   PROCALCITON  --   --   --   --   --   --   --  <0.10  --   --  0.12  PROBNP  --   --  454.0*  --   --   --   --   --   --   --   --   PHART  --   < >  --   --   < >  7.392  --   --  7.444 7.494*  --   PCO2ART  --   < >  --   --   < > 55.1*  --   --  50.4* 44.2  --   PO2ART  --   < >  --   --   < > 86.3  --   --  76.0* 69.3*  --   < > = values in this interval not displayed.  Recent Labs Lab 12/02/12 1544 12/02/12 1954 12/03/12 0041 12/03/12 0357 12/03/12 0833  GLUCAP 134* 120* 113* 122* 110*    CXR: 11/13 - ETT pulled back some, at levels of clavicles.  Bilateral lower lobe collapse.  ASSESSMENT / PLAN:  PULMONARY A:  VDRF -  Secondary to fluid overload with CHF and anasarca.  Concern was for PNA on presentation.   Mild resp acidosis per 11/12 ABG OSA? CTA neg for PE. P:   - peep to goal 10 if able, to goal 70% -abg reviewed, rate reduction -neg balance goals, tolerated thus far -trach this AM -vent needs out of proportion to pcxr, r/o pa htn - Fixed shunt??peep reduce  CARDIOVASCULAR A:  CHF by history. P:  - 2D echo - Diureses as able, continue same dose as last 24 hrs, has plenty more to give  RENAL A: Anasarca, renal function holding. Hypokalemia P:   - Replace K aggresive - Trend BMP in pm  - kvo  GASTROINTESTINAL A: Obese P:   - OGT. - TF per nutrition, increase to goal  HEMATOLOGIC A:   No active issues. P:  - Trend CBC -lov -coags eval pre tracg  INFECTIOUS A:   ? HCAP - PCT neg, bacterial infxn unlikely. P:   - d/c zosyn - f/u cultures -ct unimpressive  ENDOCRINE A:   DM Relative AI? - cortisol 3.2 inaccurate and not in shock P:   - SSI - inaccurate cortisol and no pressor, dc roids -if drops bp, add florinef  NEUROLOGIC A:   No active issues. P:   -fentanyl - Mobilize as able  Rutherford Guys, PA - S  Ccm time 30 min   I have fully examined this patient and agree with  above findings.    And edited in full  Mcarthur Rossetti. Tyson Alias, MD, FACP Pgr: [phone] [place] [facility]

## 2012-12-03 NOTE — Procedures (Signed)
Bedside Tracheostomy Insertion Procedure Note   Patient Details:   Name: Meghan Hoover DOB: [birth year] MRN: [id number]  Procedure: Tracheostomy  Pre Procedure Assessment:  Bite block in place: No, paralytic agent used Breath Sounds: Diminished  Post Procedure Assessment: BP 130/71  Pulse 72  Temp(Src) 98 F (36.7 C) (Oral)  Resp 18  Ht 5\' 8"  (1.727 m)  Wt 370 lb 6 oz (168 kg)  BMI 56.33 kg/m2  SpO2 89% O2 sats: stable throughout Complications: No apparent complications Patient did tolerate procedure well Tracheostomy Brand:[place] Tracheostomy Style:Cuffed Tracheostomy Size: 6.0 Tracheostomy Secured HQI:[id number] Tracheostomy Placement Confirmation:Trach cuff visualized and in place and Chest X ray ordered for placement    Leonard Downing 12/03/2012, 12:53 PM

## 2012-12-03 NOTE — Progress Notes (Signed)
Pt remains on full support and is tolerating well at this time due to high PEEP/FiO2 requirements. RT will monitor.

## 2012-12-04 ENCOUNTER — Inpatient Hospital Stay (HOSPITAL_COMMUNITY): Payer: BC Managed Care – PPO

## 2012-12-04 DIAGNOSIS — Z930 Tracheostomy status: Secondary | ICD-10-CM

## 2012-12-04 DIAGNOSIS — I359 Nonrheumatic aortic valve disorder, unspecified: Secondary | ICD-10-CM

## 2012-12-04 LAB — BLOOD GAS, ARTERIAL
Acid-Base Excess: 7.5 mmol/L — ABNORMAL HIGH (ref 0.0–2.0)
Drawn by: 34779
FIO2: 0.6 %
MECHVT: 510 mL
Patient temperature: 98.6
RATE: 14 resp/min
pCO2 arterial: 67.7 mmHg (ref 35.0–45.0)
pH, Arterial: 7.316 — ABNORMAL LOW (ref 7.350–7.450)
pO2, Arterial: 81.7 mmHg (ref 80.0–100.0)

## 2012-12-04 LAB — GLUCOSE, CAPILLARY
Glucose-Capillary: 111 mg/dL — ABNORMAL HIGH (ref 70–99)
Glucose-Capillary: 119 mg/dL — ABNORMAL HIGH (ref 70–99)
Glucose-Capillary: 124 mg/dL — ABNORMAL HIGH (ref 70–99)
Glucose-Capillary: 96 mg/dL (ref 70–99)
Glucose-Capillary: 98 mg/dL (ref 70–99)

## 2012-12-04 LAB — POCT I-STAT 3, ART BLOOD GAS (G3+)
Acid-Base Excess: 7 mmol/L — ABNORMAL HIGH (ref 0.0–2.0)
Acid-Base Excess: 7 mmol/L — ABNORMAL HIGH (ref 0.0–2.0)
Bicarbonate: 34.4 mEq/L — ABNORMAL HIGH (ref 20.0–24.0)
Bicarbonate: 34.9 mEq/L — ABNORMAL HIGH (ref 20.0–24.0)
pCO2 arterial: 58.2 mmHg (ref 35.0–45.0)
pCO2 arterial: 63.1 mmHg (ref 35.0–45.0)
pH, Arterial: 7.38 (ref 7.350–7.450)
pO2, Arterial: 131 mmHg — ABNORMAL HIGH (ref 80.0–100.0)
pO2, Arterial: 76 mmHg — ABNORMAL LOW (ref 80.0–100.0)

## 2012-12-04 LAB — CBC
HCT: 41.7 % (ref 36.0–46.0)
MCHC: 28.1 g/dL — ABNORMAL LOW (ref 30.0–36.0)
Platelets: 279 10*3/uL (ref 150–400)
RBC: 5.19 MIL/uL — ABNORMAL HIGH (ref 3.87–5.11)
RDW: 21.7 % — ABNORMAL HIGH (ref 11.5–15.5)
WBC: 12.6 10*3/uL — ABNORMAL HIGH (ref 4.0–10.5)

## 2012-12-04 LAB — BASIC METABOLIC PANEL
Creatinine, Ser: 0.67 mg/dL (ref 0.50–1.10)
GFR calc Af Amer: >90 mL/min (ref >90)
GFR calc non Af Amer: >90 mL/min (ref >90)
Glucose, Bld: 96 mg/dL (ref 70–99)
Potassium: 3.4 mEq/L — ABNORMAL LOW (ref 3.5–5.1)
Sodium: 150 mEq/L — ABNORMAL HIGH (ref 135–145)

## 2012-12-04 LAB — PROCALCITONIN: Procalcitonin: <0.1 ng/mL

## 2012-12-04 MED ORDER — PANTOPRAZOLE SODIUM 40 MG PO PACK
40.0000 mg | PACK | Freq: Every day | ORAL | Status: DC
  Administered 2012-12-04 – 2012-12-09 (×6): 40 mg
  Filled 2012-12-04 (×6): qty 20

## 2012-12-04 MED ORDER — POTASSIUM CHLORIDE 10 MEQ/50ML IV SOLN
10.0000 meq | INTRAVENOUS | Status: AC
  Administered 2012-12-04 (×2): 10 meq via INTRAVENOUS
  Filled 2012-12-04 (×2): qty 50

## 2012-12-04 MED ORDER — ACETAZOLAMIDE SODIUM 500 MG IJ SOLR
250.0000 mg | Freq: Three times a day (TID) | INTRAMUSCULAR | Status: AC
  Administered 2012-12-04 (×2): 250 mg via INTRAVENOUS
  Filled 2012-12-04 (×3): qty 500

## 2012-12-04 MED ORDER — POTASSIUM CHLORIDE CRYS ER 20 MEQ PO TBCR: 20.0000 meq | EXTENDED_RELEASE_TABLET | ORAL | Status: DC

## 2012-12-04 MED ORDER — FUROSEMIDE 10 MG/ML IJ SOLN
4.0000 mg/h | INTRAVENOUS | Status: DC
  Administered 2012-12-04: 4 mg/h via INTRAVENOUS
  Filled 2012-12-04: qty 25

## 2012-12-04 MED ORDER — POTASSIUM CHLORIDE 20 MEQ/15ML (10%) PO LIQD
40.0000 meq | Freq: Three times a day (TID) | ORAL | Status: AC
  Administered 2012-12-04 – 2012-12-05 (×3): 40 meq
  Filled 2012-12-04 (×3): qty 30

## 2012-12-04 NOTE — Progress Notes (Signed)
CRITICAL VALUE ALERT  Critical value received:  CO2=67.7   Date of notification:  12/04/12  Time of notification:  0525   Critical value read back:yes  Nurse who received alert:  Berkley Harvey, RN  MD notified (1st page):  Dr. Vassie Loll  Time of first page:  0525  MD notified (2nd page):  Time of second page:  Responding MD:  Dr. Vassie Loll  Time MD responded:  [phone]

## 2012-12-04 NOTE — Procedures (Signed)
Bronchoscopy Procedure Note Meghan Hoover [id number] 1960-03-28  Procedure: Bronchoscopy Indications: Diagnostic evaluation of the airways  Procedure Details Consent: Risks of procedure as well as the alternatives and risks of each were explained to the (patient/caregiver).  Consent for procedure obtained. Time Out: Verified patient identification, verified procedure, site/side was marked, verified correct patient position, special equipment/implants available, medications/allergies/relevent history reviewed, required imaging and test results available.  Performed  In preparation for procedure, patient was given 100% FiO2 and bronchoscope lubricated. Sedation: Benzodiazepines, Muscle relaxants and Etomidate  Airway entered and the following bronchi were examined: RUL, RML, RLL, LUL, LLL and Bronchi.   Procedures performed: Brushings performed Bronchoscope removed.    Evaluation Hemodynamic Status: BP stable throughout; O2 sats: stable throughout Patient's Current Condition: stable Specimens:  None Complications: No apparent complications Patient did tolerate procedure well.   Bari Leib 12/03/12

## 2012-12-04 NOTE — Op Note (Signed)
NAMECALEAH, Meghan Hoover NO.:  [id number]  MEDICAL RECORD NO.:  [id number]  LOCATION:  2M14C                        FACILITY:  [facility]  PHYSICIAN:  Nelda Bucks, MD DATE OF BIRTH:  01/13/1961  DATE OF PROCEDURE: DATE OF DISCHARGE:                              OPERATIVE REPORT   PROCEDURE:  Percutaneous tracheostomy.  PREOPERATIVE DIAGNOSES:  Likely obstructive sleep apnea, pulmonary edema, 2 weeks of mechanical ventilation, failure to wean successfully.  POSTOPERATIVE DIAGNOSES:  Continued hypoxemia, status post tracheostomy secondary to likely edema and obstructive sleep apnea.  BRONCHOSCOPIST FOR THE PROCEDURE:  Felipa Evener, MD  The bronchoscopist placed the bronchoscope through the endotracheal tube, backed up to approximately 18 cm.  The patient's FiO2 was 100% and the PEEP was at 10.  So the bronchoscopist intermittently placed a bronchoscope in and out in order to not lose all the PEEP for the patient.  The patient's saturation did quite well throughout the entire.  DESCRIPTION OF PROCEDURE:  The patient was placed in supine position. Chlorhexidine preparation was used to sterilize the operative site over the second endotracheal space.  Lidocaine plus epinephrine 7 mL was injected over the operative site.  A 1 cm vertical incision was made, dissection was made down to the tracheal planes and indication of strap muscles.  There had to be some removal of a small amount of fat tissue to improve exposure.  There was no apparent vascular markings.  An 18- gauge needle was placed into the airway successfully and noted by the bronchoscopist, the white catheter sheath was over the needle during this procedure.  The needle was removed.  White catheter sheath remained.  The wire was placed through the white catheter sheath without any posterior wall injury directly visualized by the bronchoscopist.  A 14-French punch dilator was placed in and out of the  airway.  A progressive rhino dilator to approximately 30-French was placed in and out of the airway and then a size 6 tracheostomy over 26-French dilator was placed in into the airway.  Everything was removed except for the tracheostomy.  Bronchoscopist placed a bronchoscope through the new tracheostomy into the carina approximately 4 cm below without any bleeding or posterior wall injury.  Tracheostomy was sutured into place with 4 monofilament sutures.  Postoperative chest x-ray revealed well-placed tracheostomy and no apparent complications.  Blood loss for the procedure was approximately 10 mL.  The patient to follow up with our [facility] at [phone].     Nelda Bucks, MD     DJF/MEDQ  D:  12/03/2012  T:  12/04/2012  Job:  [id number]

## 2012-12-04 NOTE — Progress Notes (Signed)
PULMONARY  / CRITICAL CARE MEDICINE  Name: Meghan Hoover MRN: [id number] DOB: [birth year]    ADMISSION DATE:  11/19/2012 CONSULTATION DATE:  12/01/12  REFERRING MD :  Dr. Kerry Hough PRIMARY SERVICE: PCCM  CHIEF COMPLAINT:  Shortness of breath  BRIEF PATIENT DESCRIPTION: 52 y.o. Current smoker  With PMH of HTN presented to [facility] ED 10/30 with shortness of breath and massive edema.  Patient admitted to [facility] ICU and treated for CHF and CAP.  Patient to be transferred to [facility] for trach and further care.    SIGNIFICANT EVENTS / STUDIES:  10/30 admitted [facility] for fluid overload and respiratory failure. 11/11 transfer to [facility] for tracheostomy and further care. 11/12 Chest CTA >>> neg for PE, volume loss bilateral lower lobes, cholelithiasis.  LINES / TUBES: L [place] TLC 11/10>>> PICC line unknown placement date>>>11/10 ET tube 10/30>>> NGT 10/30>>> Foley 10/30 >>> Rectal tube 11/11>>>  CULTURES: C. Diff 11/10>>>NTD. Urine 11/10>>>neg Blood 11/10>>>NTD Blood 11/11>>>NTD Sputum 11/1>>>neg  ANTIBIOTICS: Vanc 11/1-11/7, 11/10>>> 11/12 Zosyn 11/11>>>11/13  SUBJECTIVE: AF, comfortable on vent, neg 4 liters  VITAL SIGNS: Temp:  [98.1 F (36.7 C)-98.7 F (37.1 C)] 98.7 F (37.1 C) (11/14 0758) Pulse Rate:  [56-99] 86 (11/14 1127) Resp:  [12-26] 18 (11/14 1127) BP: (107-170)/(55-88) 141/70 mmHg (11/14 1127) SpO2:  [89 %-96 %] 94 % (11/14 1127) FiO2 (%):  [60 %-100 %] 60 % (11/14 1127) Weight:  [167.9 kg (370 lb 2.4 oz)] 167.9 kg (370 lb 2.4 oz) (11/14 0400)  HEMODYNAMICS: CVP:  [9 mmHg-13 mmHg] 13 mmHg  VENTILATOR SETTINGS: Vent Mode:  [-] PRVC FiO2 (%):  [60 %-100 %] 60 % Set Rate:  [14 bmp-20 bmp] 14 bmp Vt Set:  [510 mL-550 mL] 510 mL PEEP:  [10 cmH20-16 cmH20] 12 cmH20 Plateau Pressure:  [16 cmH20-27 cmH20] 17 cmH20  INTAKE / OUTPUT: Intake/Output     11/13 0701 - 11/14 0700 11/14 0701 - 11/15 0700   I.V. (mL/kg) 1124.3 (6.7) 102.5 (0.6)   Other     NG/GT 50    IV  Piggyback  100   Total Intake(mL/kg) 1174.3 (7) 202.5 (1.2)   Urine (mL/kg/hr) 3615 (0.9) 225 (0.3)   Stool 300 (0.1)    Total Output 3915 225   Net -2740.8 -22.5         PHYSICAL EXAMINATION: General:  Chronically ill appearing morbidly obese female. Neuro:  awake, follows some commands, moves all ext to command. HEENT:  [place]/AT, PERRL, EOM-I and MMM. Cardiovascular:  RRR, No M/R/G appreciated with respect to body habitus. Lungs:  Coarse breath sounds throughout. Abdomen:  Soft, NT, ND and +BS. Musculoskeletal:  Minimal edema, non-pitting. Skin:  Intact.  LABS:  Recent Labs Lab 12/01/12 0426  12/01/12 1144 12/01/12 1145  12/02/12 0445 12/02/12 1242  12/03/12 0336 12/03/12 0430 12/03/12 1801 12/03/12 2142 12/04/12 0410 12/04/12 0524  HGB  --   < > 11.6*  --   --  10.9*  --   --   --  11.8*  --   --  11.7*  --   WBC  --   < > 19.1*  --   --  17.2*  --   --   --  13.3*  --   --  12.6*  --   PLT  --   < > 300  --   --  281  --   --   --  314  --   --  279  --   NA  141  --  137  --   --  141  --   --   --  145  --  148* 150*  --   K 3.9  --  4.1  --   --  3.8  --   --   --  2.5*  --  3.5 3.4*  --   CL 96  --  96  --   --  97  --   --   --  100  --  103 107  --   CO2 34*  --  29  --   --  31  --   --   --  33*  --  33* 33*  --   GLUCOSE 138*  --  117*  --   --  152*  --   --   --  108*  --  95 96  --   BUN 44*  --  39*  --   --  39*  --   --   --  34*  --  35* 35*  --   CREATININE 0.95  --  0.87  --   --  0.82  --   --   --  0.78  --  0.79 0.67  --   CALCIUM 10.1  --  9.8  --   --  9.7  --   --   --  9.9  --  9.9 9.6  --   MG  --   --  2.8*  --   --  2.6*  --   --   --   --   --   --   --   --   PHOS  --   --  6.4*  --   --  4.8*  --   --   --   --   --   --   --   --   AST  --   --  12  --   --   --   --   --   --   --   --   --   --   --   ALT  --   --  14  --   --   --   --   --   --   --   --   --   --   --   ALKPHOS  --   --  66  --   --   --   --   --   --   --   --    --   --   --   BILITOT  --   --  0.6  --   --   --   --   --   --   --   --   --   --   --   PROT  --   --  7.5  --   --   --   --   --   --   --   --   --   --   --   ALBUMIN  --   --  3.3*  --   --   --   --   --   --   --   --   --   --   --   APTT  --   --  21*  --   --   --   --   --   --   --   --   --   --   --  INR  --   --  1.06  --   --   --   --   --   --   --   --   --   --   --   LATICACIDVEN  --   --   --  0.9  --   --   --   --   --   --   --   --   --   --   PROCALCITON  --   --   --   --   --   --  <0.10  --   --  0.12  --   --  <0.10  --   PROBNP  --   --  454.0*  --   --   --   --   --   --   --   --   --   --   --   PHART  --   < >  --   --   < >  --   --   < > 7.494*  --  7.344*  --   --  7.316*  PCO2ART  --   < >  --   --   < >  --   --   < > 44.2  --  65.1*  --   --  67.7*  PO2ART  --   < >  --   --   < >  --   --   < > 69.3*  --  92.0  --   --  81.7  < > = values in this interval not displayed.  Recent Labs Lab 12/03/12 1552 12/03/12 1940 12/04/12 0040 12/04/12 0344 12/04/12 0753  GLUCAP 121* 107* 96 98 111*    CXR: 11/13 - ETT pulled back some, at levels of clavicles.  Bilateral lower lobe collapse.  ASSESSMENT / PLAN:  PULMONARY A:  VDRF -  Secondary to fluid overload with CHF and anasarca.  Concern was for PNA on presentation.   Mild resp acidosis per 11/12 ABG OSA? CTA neg for PE. P:   - PEEP to goal 10 and FiO2 of 60%. - ABG reviewed, vent adjusted accordingly. - Neg balance goals, tolerated thus far - Trached and doing well. - Vent needs out of proportion to pcxr, r/o pa htn - 2D echo with contrast for ?shunt. - Check 2 ABGs at 100% and 50% and compare, would point if shunt is more or less likely.  CARDIOVASCULAR A:  CHF by history. P:  - 2D echo with contrast. - Lasix drip as below.  RENAL A: Anasarca, renal function holding. Hypokalemia P:   - Replace K aggressive. - Trend BMP in pm. - KVO IVF. - Lasix drip 4 mg/hr x24  hours. - Diamox as ordered.  GASTROINTESTINAL A: Obese P:   - NGT. - TF as ordered.  HEMATOLOGIC A:   No active issues. P:  - Trend CBC. - Lovenox for DVT prophylaxis.  INFECTIOUS A:   ? HCAP - PCT neg, bacterial infxn unlikely. P:   - D/C zosyn. - F/U cultures. - CT unimpressive.  ENDOCRINE A:   DM Relative AI? - cortisol 3.2 inaccurate and not in shock P:   - SSI. - Inaccurate cortisol and no pressor, dc steroids. - If drops bp, add florinef.  NEUROLOGIC A:   No active issues. P:   - Fentanyl - Mobilize as able  Ccm time 35 min  Rush Farmer, M.D. [facility] Pulmonary/Critical Care Medicine. Pager: [phone]. After hours pager: [phone].

## 2012-12-04 NOTE — Progress Notes (Signed)
[facility] ADULT ICU REPLACEMENT PROTOCOL FOR AM LAB REPLACEMENT ONLY  The patient does apply for the [facility] Adult ICU Electrolyte Replacment Protocol based on the criteria listed below:   1. Is GFR >/= 40 ml/min? yes  Patient's GFR today is 90 2. Is urine output >/= 0.5 ml/kg/hr for the last 6 hours? yes Patient's UOP is 1.23 ml/kg/hr 3. Is BUN < 60 mg/dL? yes  Patient's BUN today is 35 4. Abnormal electrolyte(s): K+3.4 5. Ordered repletion with see order 6. If a panic level lab has been reported, has the CCM MD in charge been notified? yes.   Physician:  Dr. Alvan Dame, Greenlee Ancheta A 12/04/2012 6:26 AM

## 2012-12-04 NOTE — Progress Notes (Signed)
  Echocardiogram 2D Echocardiogram has been performed.  Meghan Hoover FRANCES 12/04/2012, 4:43 PM

## 2012-12-05 LAB — CBC
MCH: 23 pg — ABNORMAL LOW (ref 26.0–34.0)
MCHC: 28.4 g/dL — ABNORMAL LOW (ref 30.0–36.0)
Platelets: 254 10*3/uL (ref 150–400)
RBC: 5.22 MIL/uL — ABNORMAL HIGH (ref 3.87–5.11)
RDW: 21.9 % — ABNORMAL HIGH (ref 11.5–15.5)

## 2012-12-05 LAB — CULTURE, BLOOD (ROUTINE X 2)
Culture: NO GROWTH
Culture: NO GROWTH

## 2012-12-05 LAB — POCT I-STAT 3, ART BLOOD GAS (G3+)
Bicarbonate: 32.4 mEq/L — ABNORMAL HIGH (ref 20.0–24.0)
TCO2: 34 mmol/L (ref 0–100)
pCO2 arterial: 58.3 mmHg (ref 35.0–45.0)
pH, Arterial: 7.353 (ref 7.350–7.450)
pO2, Arterial: 88 mmHg (ref 80.0–100.0)

## 2012-12-05 LAB — BASIC METABOLIC PANEL
BUN: 30 mg/dL — ABNORMAL HIGH (ref 6–23)
Calcium: 10.3 mg/dL (ref 8.4–10.5)
Creatinine, Ser: 0.7 mg/dL (ref 0.50–1.10)
GFR calc Af Amer: >90 mL/min (ref >90)
GFR calc non Af Amer: >90 mL/min (ref >90)
Glucose, Bld: 120 mg/dL — ABNORMAL HIGH (ref 70–99)
Sodium: 150 mEq/L — ABNORMAL HIGH (ref 135–145)

## 2012-12-05 LAB — GLUCOSE, CAPILLARY
Glucose-Capillary: 112 mg/dL — ABNORMAL HIGH (ref 70–99)
Glucose-Capillary: 130 mg/dL — ABNORMAL HIGH (ref 70–99)
Glucose-Capillary: 132 mg/dL — ABNORMAL HIGH (ref 70–99)
Glucose-Capillary: 123 mg/dL — ABNORMAL HIGH (ref 70–99)

## 2012-12-05 LAB — PHOSPHORUS: Phosphorus: 5.1 mg/dL — ABNORMAL HIGH (ref 2.3–4.6)

## 2012-12-05 MED ORDER — POTASSIUM CHLORIDE 10 MEQ/50ML IV SOLN
10.0000 meq | INTRAVENOUS | Status: AC
  Administered 2012-12-05 (×2): 10 meq via INTRAVENOUS
  Filled 2012-12-05 (×2): qty 50

## 2012-12-05 MED ORDER — ACETAZOLAMIDE SODIUM 500 MG IJ SOLR
250.0000 mg | Freq: Three times a day (TID) | INTRAMUSCULAR | Status: AC
  Administered 2012-12-05 (×2): 250 mg via INTRAVENOUS
  Filled 2012-12-05 (×2): qty 500

## 2012-12-05 MED ORDER — POTASSIUM CHLORIDE 20 MEQ/15ML (10%) PO LIQD
ORAL | Status: AC
  Filled 2012-12-05: qty 30

## 2012-12-05 MED ORDER — FREE WATER
300.0000 mL | Status: DC
  Administered 2012-12-05 – 2012-12-09 (×26): 300 mL

## 2012-12-05 MED ORDER — FUROSEMIDE 10 MG/ML IJ SOLN
40.0000 mg | Freq: Four times a day (QID) | INTRAMUSCULAR | Status: AC
  Administered 2012-12-05 (×3): 40 mg via INTRAVENOUS
  Filled 2012-12-05 (×3): qty 4

## 2012-12-05 MED ORDER — POTASSIUM CHLORIDE 20 MEQ/15ML (10%) PO LIQD
40.0000 meq | Freq: Three times a day (TID) | ORAL | Status: AC
  Administered 2012-12-05 (×3): 40 meq
  Filled 2012-12-05 (×4): qty 30

## 2012-12-05 NOTE — Progress Notes (Signed)
Vent changes made per MD order. Pt tolerating well at this time. Okay to try PS trials if pt continues to tolerate vent changes. RT will continue to monitor.

## 2012-12-05 NOTE — Progress Notes (Signed)
PULMONARY  / CRITICAL CARE MEDICINE  Name: Meghan Hoover MRN: [id number] DOB: [birth year]    ADMISSION DATE:  11/19/2012 CONSULTATION DATE:  12/01/12  REFERRING MD :  Dr. Kerry Hough PRIMARY SERVICE: PCCM  CHIEF COMPLAINT:  Shortness of breath  BRIEF PATIENT DESCRIPTION: 51 y.o. Current smoker  With PMH of HTN presented to [facility] ED 10/30 with shortness of breath and massive edema.  Patient admitted to [facility] ICU and treated for CHF and CAP.  Patient to be transferred to [facility] for trach and further care.    SIGNIFICANT EVENTS / STUDIES:  10/30 admitted [facility] for fluid overload and respiratory failure. 11/11 transfer to [facility] for tracheostomy and further care. 11/12 Chest CTA >>> neg for PE, volume loss bilateral lower lobes, cholelithiasis.  LINES / TUBES: L [place] TLC 11/10>>> PICC line unknown placement date>>>11/10 ET tube 10/30>>> NGT 10/30>>> Foley 10/30 >>> Rectal tube 11/11>>>  CULTURES: C. Diff 11/10>>>NTD. Urine 11/10>>>neg Blood 11/10>>>NTD Blood 11/11>>>NTD Sputum 11/1>>>neg  ANTIBIOTICS: Vanc 11/1-11/7, 11/10>>> 11/12 Zosyn 11/11>>>11/13  SUBJECTIVE: AF, comfortable on vent, neg 4 liters  VITAL SIGNS: Temp:  [98.1 F (36.7 C)-99.7 F (37.6 C)] 99.1 F (37.3 C) (11/15 0315) Pulse Rate:  [77-95] 93 (11/15 0814) Resp:  [14-24] 24 (11/15 0814) BP: (100-158)/(53-91) 100/71 mmHg (11/15 0700) SpO2:  [91 %-98 %] 94 % (11/15 0814) FiO2 (%):  [50 %-100 %] 50 % (11/15 0814)  HEMODYNAMICS: CVP:  [7 mmHg-12 mmHg] 7 mmHg  VENTILATOR SETTINGS: Vent Mode:  [-] PRVC FiO2 (%):  [50 %-100 %] 50 % Set Rate:  [14 bmp] 14 bmp Vt Set:  [510 mL] 510 mL PEEP:  [10 cmH20-12 cmH20] 10 cmH20 Plateau Pressure:  [17 cmH20-22 cmH20] 18 cmH20  INTAKE / OUTPUT: Intake/Output     11/14 0701 - 11/15 0700 11/15 0701 - 11/16 0700   I.V. (mL/kg) 800.5 (4.8)    NG/GT 450    IV Piggyback 200    Total Intake(mL/kg) 1450.5 (8.6)    Urine (mL/kg/hr) 2415 (0.6) 360 (0.8)   Stool     Total  Output 2415 360   Net -964.5 -360         PHYSICAL EXAMINATION: General:  Chronically ill appearing morbidly obese female. Neuro:  awake, follows some commands, moves all ext to command. HEENT:  [place]/AT, PERRL, EOM-I and MMM. Cardiovascular:  RRR, No M/R/G appreciated with respect to body habitus. Lungs:  Coarse breath sounds throughout. Abdomen:  Soft, NT, ND and +BS. Musculoskeletal:  Minimal edema, non-pitting. Skin:  Intact.  LABS:  Recent Labs Lab 12/01/12 1144 12/01/12 1145  12/02/12 0445 12/02/12 1242  12/03/12 0430  12/03/12 2142 12/04/12 0410  12/04/12 1241 12/04/12 1320 12/05/12 0346 12/05/12 0410  HGB 11.6*  --   --  10.9*  --   --  11.8*  --   --  11.7*  --   --   --  12.0  --   WBC 19.1*  --   --  17.2*  --   --  13.3*  --   --  12.6*  --   --   --  13.7*  --   PLT 300  --   --  281  --   --  314  --   --  279  --   --   --  254  --   NA 137  --   --  141  --   --  145  --  148* 150*  --   --   --  150*  --   K 4.1  --   --  3.8  --   --  2.5*  --  3.5 3.4*  --   --   --  3.4*  --   CL 96  --   --  97  --   --  100  --  103 107  --   --   --  108  --   CO2 29  --   --  31  --   --  33*  --  33* 33*  --   --   --  32  --   GLUCOSE 117*  --   --  152*  --   --  108*  --  95 96  --   --   --  120*  --   BUN 39*  --   --  39*  --   --  34*  --  35* 35*  --   --   --  30*  --   CREATININE 0.87  --   --  0.82  --   --  0.78  --  0.79 0.67  --   --   --  0.70  --   CALCIUM 9.8  --   --  9.7  --   --  9.9  --  9.9 9.6  --   --   --  10.3  --   MG 2.8*  --   --  2.6*  --   --   --   --   --   --   --   --   --  2.4  --   PHOS 6.4*  --   --  4.8*  --   --   --   --   --   --   --   --   --  5.1*  --   AST 12  --   --   --   --   --   --   --   --   --   --   --   --   --   --   ALT 14  --   --   --   --   --   --   --   --   --   --   --   --   --   --   ALKPHOS 66  --   --   --   --   --   --   --   --   --   --   --   --   --   --   BILITOT 0.6  --   --   --   --   --    --   --   --   --   --   --   --   --   --   PROT 7.5  --   --   --   --   --   --   --   --   --   --   --   --   --   --   ALBUMIN 3.3*  --   --   --   --   --   --   --   --   --   --   --   --   --   --  APTT 21*  --   --   --   --   --   --   --   --   --   --   --   --   --   --   INR 1.06  --   --   --   --   --   --   --   --   --   --   --   --   --   --   LATICACIDVEN  --  0.9  --   --   --   --   --   --   --   --   --   --   --   --   --   PROCALCITON  --   --   --   --  <0.10  --  0.12  --   --  <0.10  --   --   --   --   --   PROBNP 454.0*  --   --   --   --   --   --   --   --   --   --   --   --   --   --   PHART  --   --   < >  --   --   < >  --   < >  --   --   < > 7.351 7.380  --  7.353  PCO2ART  --   --   < >  --   --   < >  --   < >  --   --   < > 63.1* 58.2*  --  58.3*  PO2ART  --   --   < >  --   --   < >  --   < >  --   --   < > 131.0* 76.0*  --  88.0  < > = values in this interval not displayed.  Recent Labs Lab 12/04/12 1555 12/04/12 2034 12/05/12 0012 12/05/12 0313 12/05/12 0758  GLUCAP 119* 124* 119* 112* 130*    CXR: 11/13 - ETT pulled back some, at levels of clavicles.  Bilateral lower lobe collapse.  ASSESSMENT / PLAN:  PULMONARY A:  VDRF -  Secondary to fluid overload with CHF and anasarca.  Concern was for PNA on presentation.   Mild resp acidosis per 11/12 ABG OSA? CTA neg for PE. P:   - PEEP to goal 5 and FiO2 of 40%. - ABG reviewed, vent adjusted accordingly. - Neg balance goals, tolerated thus far. - Trached and doing well. - 2D echo with contrast for ?shunt. - Two ABGs done and compared, less likely to be shunt since PaO2 dropped by 1/2 when comparing 100% to 50%.  CARDIOVASCULAR A:  CHF by history. P:  - 2D echo noted but unfortunately was a difficult study to assess for shunt. - D/C lasix drip, Lasix as per renal function.  RENAL A: Anasarca, renal function holding. Hypokalemia P:   - Replace K aggressively. - Trend BMP  in pm. - KVO IVF. - Lasix 40 mg IV q6 hours x3 doses. - Diamox as ordered. - Free water for hypernatremia.  GASTROINTESTINAL A: Obese P:   - NGT. - TF as ordered.  HEMATOLOGIC A:   No active issues. P:  - Trend CBC. - Lovenox for DVT prophylaxis.  INFECTIOUS A:   ?  HCAP - PCT neg, bacterial infxn unlikely. P:   - D/Ced zosyn. - F/U cultures all negative. - CT unimpressive.  ENDOCRINE A:   DM Relative AI? - cortisol 3.2 inaccurate and not in shock P:   - SSI. - Inaccurate cortisol and no pressor, dced steroids. - If drops bp, add florinef.  NEUROLOGIC A:   No active issues. P:   - Fentanyl PRN. - Mobilize as able.  Decrease PEEP to 5 and FiO2 of 40% and if able to tolerate then will begin PS trials.  Anticipate TC by Monday.  Lasix drip d/ced, will give intermittent dosages only.  Start free water for hypernatremia.  Ccm time 35 min   Alyson Reedy, M.D. [facility] Pulmonary/Critical Care Medicine. Pager: [phone]. After hours pager: [phone].

## 2012-12-05 NOTE — Progress Notes (Signed)
[facility] ADULT ICU REPLACEMENT PROTOCOL FOR AM LAB REPLACEMENT ONLY  The patient does apply for the [facility] Adult ICU Electrolyte Replacment Protocol based on the criteria listed below:   1. Is GFR >/= 40 ml/min? yes  Patient's GFR today is >90 2. Is urine output >/= 0.5 ml/kg/hr for the last 6 hours? yes Patient's UOP is 0.8 ml/kg/hr 3. Is BUN < 60 mg/dL? yes  Patient's BUN today is 30 4. Abnormal electrolyte(s):K 3.4 5. Ordered repletion with: per protocol 6. If a panic level lab has been reported, has the CCM MD in charge been notified? no.   Physician:    Markus Daft A 12/05/2012 5:11 AM

## 2012-12-05 NOTE — Progress Notes (Signed)
CRITICAL VALUE ALERT  Critical value received:  CO2 on ABG 58.3  Date of notification:  12/05/12  Time of notification:  0400  Critical value read back:yes  Nurse who received alert:  Crist Fat RN  MD notified (1st page):  Dr. Corliss Parish)  Time of first page:  0500  MD notified (2nd page):NA  Time of second page:NA  Responding MD:  Belinda Block  Time MD responded:  0500

## 2012-12-06 ENCOUNTER — Inpatient Hospital Stay (HOSPITAL_COMMUNITY): Payer: BC Managed Care – PPO

## 2012-12-06 LAB — BLOOD GAS, ARTERIAL
Acid-Base Excess: 6.9 mmol/L — ABNORMAL HIGH (ref 0.0–2.0)
Bicarbonate: 33 mEq/L — ABNORMAL HIGH (ref 20.0–24.0)
Drawn by: 23404
FIO2: 40 %
PEEP: 5 cmH2O
Patient temperature: 98.6
RATE: 14 resp/min
TCO2: 35 mmol/L (ref 0–100)
pCO2 arterial: 67.1 mmHg (ref 35.0–45.0)
pO2, Arterial: 76.2 mmHg — ABNORMAL LOW (ref 80.0–100.0)

## 2012-12-06 LAB — GLUCOSE, CAPILLARY
Glucose-Capillary: 149 mg/dL — ABNORMAL HIGH (ref 70–99)
Glucose-Capillary: 153 mg/dL — ABNORMAL HIGH (ref 70–99)
Glucose-Capillary: 155 mg/dL — ABNORMAL HIGH (ref 70–99)
Glucose-Capillary: 158 mg/dL — ABNORMAL HIGH (ref 70–99)
Glucose-Capillary: 163 mg/dL — ABNORMAL HIGH (ref 70–99)

## 2012-12-06 LAB — CBC
HCT: 45.7 % (ref 36.0–46.0)
Hemoglobin: 12.9 g/dL (ref 12.0–15.0)
MCH: 22.8 pg — ABNORMAL LOW (ref 26.0–34.0)
MCHC: 28.2 g/dL — ABNORMAL LOW (ref 30.0–36.0)
Platelets: 278 10*3/uL (ref 150–400)
RBC: 5.65 MIL/uL — ABNORMAL HIGH (ref 3.87–5.11)

## 2012-12-06 LAB — BASIC METABOLIC PANEL
GFR calc Af Amer: >90 mL/min (ref >90)
GFR calc non Af Amer: >90 mL/min (ref >90)
Glucose, Bld: 159 mg/dL — ABNORMAL HIGH (ref 70–99)
Potassium: 3.4 mEq/L — ABNORMAL LOW (ref 3.5–5.1)
Sodium: 149 mEq/L — ABNORMAL HIGH (ref 135–145)

## 2012-12-06 LAB — PHOSPHORUS: Phosphorus: 4.6 mg/dL (ref 2.3–4.6)

## 2012-12-06 LAB — MAGNESIUM: Magnesium: 2.4 mg/dL (ref 1.5–2.5)

## 2012-12-06 MED ORDER — FUROSEMIDE 10 MG/ML IJ SOLN
40.0000 mg | Freq: Four times a day (QID) | INTRAMUSCULAR | Status: AC
  Administered 2012-12-06 (×3): 40 mg via INTRAVENOUS
  Filled 2012-12-06 (×3): qty 4

## 2012-12-06 MED ORDER — ACETAZOLAMIDE SODIUM 500 MG IJ SOLR
250.0000 mg | Freq: Three times a day (TID) | INTRAMUSCULAR | Status: AC
  Administered 2012-12-06 (×2): 250 mg via INTRAVENOUS
  Filled 2012-12-06 (×2): qty 500

## 2012-12-06 MED ORDER — IPRATROPIUM BROMIDE 0.02 % IN SOLN
0.5000 mg | Freq: Four times a day (QID) | RESPIRATORY_TRACT | Status: DC
  Administered 2012-12-07 – 2012-12-08 (×6): 0.5 mg via RESPIRATORY_TRACT
  Filled 2012-12-06 (×6): qty 2.5

## 2012-12-06 MED ORDER — POTASSIUM CHLORIDE 20 MEQ/15ML (10%) PO LIQD
40.0000 meq | Freq: Three times a day (TID) | ORAL | Status: AC
  Administered 2012-12-06 (×2): 40 meq
  Filled 2012-12-06 (×2): qty 30

## 2012-12-06 MED ORDER — POTASSIUM CHLORIDE 10 MEQ/50ML IV SOLN
10.0000 meq | INTRAVENOUS | Status: AC
  Administered 2012-12-06 (×2): 10 meq via INTRAVENOUS
  Filled 2012-12-06: qty 50

## 2012-12-06 MED ORDER — ALBUTEROL SULFATE (5 MG/ML) 0.5% IN NEBU
2.5000 mg | INHALATION_SOLUTION | Freq: Four times a day (QID) | RESPIRATORY_TRACT | Status: DC
  Administered 2012-12-07 – 2012-12-08 (×6): 2.5 mg via RESPIRATORY_TRACT
  Filled 2012-12-06 (×6): qty 0.5

## 2012-12-06 NOTE — Progress Notes (Signed)
Pt pulled out small bore feeding tube.  Tube feeds on hold. [place] feeding tube reinserted into R nare.  Xray ordered to verify placement.  Will restart TF once placement verified.

## 2012-12-06 NOTE — Progress Notes (Signed)
PULMONARY  / CRITICAL CARE MEDICINE  Name: Meghan Hoover MRN: [id number] DOB: 09-04-1960    ADMISSION DATE:  11/19/2012 CONSULTATION DATE:  12/01/12  REFERRING MD :  Dr. Kerry Hough PRIMARY SERVICE: PCCM  CHIEF COMPLAINT:  Shortness of breath  BRIEF PATIENT DESCRIPTION: 52 y.o. Current smoker  With PMH of HTN presented to [facility] ED 10/30 with shortness of breath and massive edema.  Patient admitted to [facility] ICU and treated for CHF and CAP.  Patient to be transferred to [facility] for trach and further care.    SIGNIFICANT EVENTS / STUDIES:  10/30 admitted [facility] for fluid overload and respiratory failure. 11/11 transfer to [facility] for tracheostomy and further care. 11/12 Chest CTA >>> neg for PE, volume loss bilateral lower lobes, cholelithiasis. 11/15 PEEP down to 5 and well tolerated 11/16 begin PS trials.  LINES / TUBES: L [place] TLC 11/10>>> PICC line unknown placement date>>>11/10 ET tube 10/30>>>11/14 Trach 11/14 (DF)>>> NGT 10/30>>> Foley 10/30 >>> Rectal tube 11/11>>>  CULTURES: C. Diff 11/10>>>NTD. Urine 11/10>>>neg Blood 11/10>>>NTD Blood 11/11>>>NTD Sputum 11/1>>>neg  ANTIBIOTICS: Vanc 11/1-11/7, 11/10>>> 11/12 Zosyn 11/11>>>11/13  SUBJECTIVE: AF, comfortable on vent, tolerated PEEP down overnight.  VITAL SIGNS: Temp:  [99 F (37.2 C)-101.4 F (38.6 C)] 99.8 F (37.7 C) (11/16 0400) Pulse Rate:  [92-112] 112 (11/16 0957) Resp:  [12-25] 25 (11/16 0957) BP: (107-153)/(23-134) 134/98 mmHg (11/16 0957) SpO2:  [90 %-96 %] 93 % (11/16 0957) FiO2 (%):  [40 %-60 %] 40 % (11/16 0957)  HEMODYNAMICS: CVP:  [3 mmHg-5 mmHg] 3 mmHg  VENTILATOR SETTINGS: Vent Mode:  [-] CPAP FiO2 (%):  [40 %-60 %] 40 % Set Rate:  [14 bmp] 14 bmp Vt Set:  [510 mL] 510 mL PEEP:  [5 cmH20] 5 cmH20 Pressure Support:  [8 cmH20-10 cmH20] 8 cmH20 Plateau Pressure:  [20 cmH20-21 cmH20] 20 cmH20  INTAKE / OUTPUT: Intake/Output     11/15 0701 - 11/16 0700 11/16 0701 - 11/17 0700   I.V. (mL/kg) 722.5  (4.3)    NG/GT 3005.8    IV Piggyback     Total Intake(mL/kg) 3728.3 (22.2)    Urine (mL/kg/hr) 4495 (1.1) 250 (0.5)   Stool 100 (0)    Total Output 4595 250   Net -866.7 -250         PHYSICAL EXAMINATION: General:  Chronically ill appearing morbidly obese female. Neuro:  awake, follows some commands, moves all ext to command. HEENT:  /AT, PERRL, EOM-I and MMM. Cardiovascular:  RRR, No M/R/G appreciated with respect to body habitus. Lungs:  Coarse breath sounds throughout. Abdomen:  Soft, NT, ND and +BS. Musculoskeletal:  Minimal edema, non-pitting. Skin:  Intact.  LABS:  Recent Labs Lab 12/01/12 1144 12/01/12 1145  12/02/12 0445 12/02/12 1242  12/03/12 0430  12/04/12 0410  12/04/12 1320 12/05/12 0346 12/05/12 0410 12/06/12 0433 12/06/12 0500  HGB 11.6*  --   --  10.9*  --   --  11.8*  --  11.7*  --   --  12.0  --  12.9  --   WBC 19.1*  --   --  17.2*  --   --  13.3*  --  12.6*  --   --  13.7*  --  17.4*  --   PLT 300  --   --  281  --   --  314  --  279  --   --  254  --  278  --   NA 137  --   --  141  --   --  145  < > 150*  --   --  150*  --  149*  --   K 4.1  --   --  3.8  --   --  2.5*  < > 3.4*  --   --  3.4*  --  3.4*  --   CL 96  --   --  97  --   --  100  < > 107  --   --  108  --  107  --   CO2 29  --   --  31  --   --  33*  < > 33*  --   --  32  --  32  --   GLUCOSE 117*  --   --  152*  --   --  108*  < > 96  --   --  120*  --  159*  --   BUN 39*  --   --  39*  --   --  34*  < > 35*  --   --  30*  --  35*  --   CREATININE 0.87  --   --  0.82  --   --  0.78  < > 0.67  --   --  0.70  --  0.74  --   CALCIUM 9.8  --   --  9.7  --   --  9.9  < > 9.6  --   --  10.3  --  10.2  --   MG 2.8*  --   --  2.6*  --   --   --   --   --   --   --  2.4  --  2.4  --   PHOS 6.4*  --   --  4.8*  --   --   --   --   --   --   --  5.1*  --  4.6  --   AST 12  --   --   --   --   --   --   --   --   --   --   --   --   --   --   ALT 14  --   --   --   --   --   --   --   --    --   --   --   --   --   --   ALKPHOS 66  --   --   --   --   --   --   --   --   --   --   --   --   --   --   BILITOT 0.6  --   --   --   --   --   --   --   --   --   --   --   --   --   --   PROT 7.5  --   --   --   --   --   --   --   --   --   --   --   --   --   --   ALBUMIN 3.3*  --   --   --   --   --   --   --   --   --   --   --   --   --   --  APTT 21*  --   --   --   --   --   --   --   --   --   --   --   --   --   --   INR 1.06  --   --   --   --   --   --   --   --   --   --   --   --   --   --   LATICACIDVEN  --  0.9  --   --   --   --   --   --   --   --   --   --   --   --   --   PROCALCITON  --   --   --   --  <0.10  --  0.12  --  <0.10  --   --   --   --   --   --   PROBNP 454.0*  --   --   --   --   --   --   --   --   --   --   --   --   --   --   PHART  --   --   < >  --   --   < >  --   < >  --   < > 7.380  --  7.353  --  7.313*  PCO2ART  --   --   < >  --   --   < >  --   < >  --   < > 58.2*  --  58.3*  --  67.1*  PO2ART  --   --   < >  --   --   < >  --   < >  --   < > 76.0*  --  88.0  --  76.2*  < > = values in this interval not displayed.  Recent Labs Lab 12/05/12 1534 12/05/12 1959 12/06/12 0013 12/06/12 0419 12/06/12 0741  GLUCAP 123* 142* 163* 163* 155*    CXR: 11/13 - ETT pulled back some, at levels of clavicles.  Bilateral lower lobe collapse.  ASSESSMENT / PLAN:  PULMONARY A:  VDRF -  Secondary to fluid overload with CHF and anasarca.  Concern was for PNA on presentation.   Mild resp acidosis per 11/12 ABG OSA? CTA neg for PE. P:   - PEEP to goal 5 and FiO2 of 40%, will attempt PS trials this AM. - Hold further ABGs at this point. - Neg balance goals, tolerated thus far. - Trached and doing well. - 2D echo with contrast for ?shunt was inconclusive. - Two ABGs done and compared, less likely to be shunt since PaO2 dropped by 1/2 when comparing 100% to 50%.  CARDIOVASCULAR A:  CHF by history. P:  - 2D echo noted but unfortunately was a  difficult study to assess for shunt. - D/C lasix drip, Lasix as per renal function.  RENAL A: Anasarca, renal function holding. Hypokalemia P:   - Replace K as needed. - Trend BMP in pm. - KVO IVF. - Lasix 40 mg IV q6 hours x3 doses. - Diamox as ordered. - Free water for hypernatremia.  GASTROINTESTINAL A: Obese P:   - NGT. - TF as ordered.  HEMATOLOGIC A:   No active issues. P:  - Trend CBC. -  Lovenox for DVT prophylaxis.  INFECTIOUS A:   ? HCAP - PCT neg, bacterial infxn unlikely. P:   - D/Ced zosyn. - F/U cultures all negative. - CT unimpressive.  ENDOCRINE A:   DM Relative AI? - cortisol 3.2 inaccurate and not in shock P:   - SSI. - Inaccurate cortisol and no pressor, dced steroids. - If drops bp, add florinef.  NEUROLOGIC A:   No active issues. P:   - Fentanyl PRN. - Mobilize as able.  Tolerated PEEP and FiO2 drop, will start PS trials.  Anticipate TC by Monday.  Lasix intermittently.  Start free water for hypernatremia.  Ccm time 35 min   Alyson Reedy, M.D. [facility] Pulmonary/Critical Care Medicine. Pager: [phone]. After hours pager: [phone].

## 2012-12-06 NOTE — Progress Notes (Signed)
CRITICAL VALUE ALERT  Critical value received:  CO2 67  Date of notification:  12/06/12  Time of notification:  0410  Critical value read back:yes  Nurse who received alert:  A.Canary Brim RN  MD notified (1st page):  McQuaid MD  Time of first page:  0410  MD notified (2nd page):  Time of second page:  Responding MD:  Kendrick Fries MD  Time MD responded:  [phone]

## 2012-12-06 NOTE — Progress Notes (Signed)
[facility] ADULT ICU REPLACEMENT PROTOCOL FOR AM LAB REPLACEMENT ONLY  The patient does apply for the [facility] Adult ICU Electrolyte Replacment Protocol based on the criteria listed below:   1. Is GFR >/= 40 ml/min? yes  Patient's GFR today is >90 2. Is urine output >/= 0.5 ml/kg/hr for the last 6 hours? yes Patient's UOP is 1.3 ml/kg/hr 3. Is BUN < 60 mg/dL? yes  Patient's BUN today is 35 4. Abnormal electrolyte(s): K3.4 5. Ordered repletion with: IV/KCL 6. If a panic level lab has been reported, has the CCM MD in charge been notified? yes.   Physician:  Leandrew Koyanagi MD  Melrose Nakayama 12/06/2012 6:59 AM

## 2012-12-07 LAB — CULTURE, BLOOD (ROUTINE X 2): Culture: NO GROWTH

## 2012-12-07 LAB — BASIC METABOLIC PANEL
Calcium: 10.4 mg/dL (ref 8.4–10.5)
Chloride: 107 mEq/L (ref 96–112)
GFR calc Af Amer: >90 mL/min (ref >90)
GFR calc non Af Amer: 83 mL/min — ABNORMAL LOW (ref >90)
Potassium: 3.5 mEq/L (ref 3.5–5.1)
Sodium: 150 mEq/L — ABNORMAL HIGH (ref 135–145)

## 2012-12-07 LAB — BLOOD GAS, ARTERIAL
Bicarbonate: 32.3 mEq/L — ABNORMAL HIGH (ref 20.0–24.0)
Drawn by: 35135
PEEP: 5 cmH2O
Patient temperature: 99.8
RATE: 14 resp/min
pCO2 arterial: 68.4 mmHg (ref 35.0–45.0)
pO2, Arterial: 81.5 mmHg (ref 80.0–100.0)

## 2012-12-07 LAB — GLUCOSE, CAPILLARY
Glucose-Capillary: 123 mg/dL — ABNORMAL HIGH (ref 70–99)
Glucose-Capillary: 130 mg/dL — ABNORMAL HIGH (ref 70–99)
Glucose-Capillary: 138 mg/dL — ABNORMAL HIGH (ref 70–99)
Glucose-Capillary: 149 mg/dL — ABNORMAL HIGH (ref 70–99)

## 2012-12-07 LAB — PHOSPHORUS: Phosphorus: 5.3 mg/dL — ABNORMAL HIGH (ref 2.3–4.6)

## 2012-12-07 LAB — CBC
Hemoglobin: 12.7 g/dL (ref 12.0–15.0)
MCHC: 28.9 g/dL — ABNORMAL LOW (ref 30.0–36.0)
Platelets: 236 10*3/uL (ref 150–400)
RBC: 5.45 MIL/uL — ABNORMAL HIGH (ref 3.87–5.11)
WBC: 15.4 10*3/uL — ABNORMAL HIGH (ref 4.0–10.5)

## 2012-12-07 LAB — MAGNESIUM: Magnesium: 2.5 mg/dL (ref 1.5–2.5)

## 2012-12-07 MED ORDER — POTASSIUM CHLORIDE 20 MEQ/15ML (10%) PO LIQD
ORAL | Status: AC
  Filled 2012-12-07: qty 30

## 2012-12-07 MED ORDER — FENTANYL CITRATE 0.05 MG/ML IJ SOLN
25.0000 ug | INTRAMUSCULAR | Status: DC | PRN
  Administered 2012-12-08: 100 ug via INTRAVENOUS
  Filled 2012-12-07: qty 2

## 2012-12-07 MED ORDER — FUROSEMIDE 10 MG/ML IJ SOLN
40.0000 mg | Freq: Three times a day (TID) | INTRAMUSCULAR | Status: AC
  Administered 2012-12-07 (×2): 40 mg via INTRAVENOUS
  Filled 2012-12-07 (×2): qty 4

## 2012-12-07 MED ORDER — POTASSIUM CHLORIDE 20 MEQ/15ML (10%) PO LIQD
40.0000 meq | Freq: Three times a day (TID) | ORAL | Status: AC
  Administered 2012-12-07 (×2): 40 meq
  Filled 2012-12-07 (×2): qty 30

## 2012-12-07 NOTE — Progress Notes (Signed)
CRITICAL VALUE ALERT  Critical value received:  CO2 68  Date of notification:  11/17  Time of notification:  0445  Critical value read back:yes  Nurse who received alert:  A.Canary Brim RN  MD notified (1st page):  elink  Time of first page:  0500  MD notified (2nd page):  Time of second page:  Responding MD:  elink nurse, value consistent with previous   Time MD responded:  0500

## 2012-12-07 NOTE — Progress Notes (Signed)
PULMONARY  / CRITICAL CARE MEDICINE  Name: Meghan Hoover MRN: [id number] DOB: 1960-10-14    ADMISSION DATE:  11/19/2012 CONSULTATION DATE:  12/01/12  REFERRING MD :  Dr. Kerry Hough PRIMARY SERVICE: PCCM  CHIEF COMPLAINT:  Shortness of breath  BRIEF PATIENT DESCRIPTION: 52 y.o. Current smoker  With PMH of HTN presented to [facility] ED 10/30 with shortness of breath and massive edema.  Patient admitted to [facility] ICU and treated for CHF and CAP.  Patient to be transferred to [facility] for trach and further care.    SIGNIFICANT EVENTS / STUDIES:  10/30 admitted [facility] for fluid overload and respiratory failure. 11/11 transfer to [facility] for tracheostomy and further care. 11/12 Chest CTA >>> neg for PE, volume loss bilateral lower lobes, cholelithiasis. 11/15 PEEP down to 5 and well tolerated 11/16 begin PS trials.  LINES / TUBES: L [place] TLC 11/10>>> PICC line unknown placement date>>>11/10 ET tube 10/30>>>11/14 Trach 11/14 (DF)>>>changed to extra long 11/17>>> NGT 10/30>>> Foley 10/30 >>> Rectal tube 11/11>>>  CULTURES: C. Diff 11/10>>>NTD. Urine 11/10>>>neg Blood 11/10>>>NTD Blood 11/11>>>NTD Sputum 11/1>>>neg  ANTIBIOTICS: Vanc 11/1-11/7, 11/10>>> 11/12 Zosyn 11/11>>>11/13  SUBJECTIVE: AF, comfortable on vent, tolerated PEEP down overnight.  VITAL SIGNS: Temp:  [98.7 F (37.1 C)-101.6 F (38.7 C)] 98.9 F (37.2 C) (11/17 1221) Pulse Rate:  [80-117] 85 (11/17 1200) Resp:  [12-20] 14 (11/17 1200) BP: (96-153)/(72-113) 110/75 mmHg (11/17 1200) SpO2:  [88 %-100 %] 94 % (11/17 1200) FiO2 (%):  [40 %-60 %] 60 % (11/17 1158) Weight:  [175.7 kg (387 lb 5.6 oz)] 175.7 kg (387 lb 5.6 oz) (11/17 0500)  HEMODYNAMICS: CVP:  [7 mmHg-9 mmHg] 7 mmHg  VENTILATOR SETTINGS: Vent Mode:  [-] PRVC FiO2 (%):  [40 %-60 %] 60 % Set Rate:  [14 bmp] 14 bmp Vt Set:  [510 mL] 510 mL PEEP:  [5 cmH20] 5 cmH20 Pressure Support:  [8 cmH20] 8 cmH20 Plateau Pressure:  [21 cmH20] 21 cmH20  INTAKE /  OUTPUT: Intake/Output     11/16 0701 - 11/17 0700 11/17 0701 - 11/18 0700   I.V. (mL/kg) 727.5 (4.1) 142.5 (0.8)   Other  45   NG/GT 2815 325   IV Piggyback 100    Total Intake(mL/kg) 3642.5 (20.7) 512.5 (2.9)   Urine (mL/kg/hr) 2755 (0.7) 425 (0.4)   Stool 500 (0.1)    Total Output 3255 425   Net +387.5 +87.5         PHYSICAL EXAMINATION: General:  Chronically ill appearing morbidly obese female. Neuro:  awake, follows some commands, moves all ext to command. HEENT:  [place]/AT, PERRL, EOM-I and MMM. Cardiovascular:  RRR, No M/R/G appreciated with respect to body habitus. Lungs:  Coarse breath sounds throughout. Abdomen:  Soft, NT, ND and +BS. Musculoskeletal:  Minimal edema, non-pitting. Skin:  Intact.  LABS:  Recent Labs Lab 12/01/12 1144 12/01/12 1145  12/02/12 1242  12/03/12 0430  12/04/12 0410  12/05/12 0346 12/05/12 0410 12/06/12 0433 12/06/12 0500 12/07/12 0444 12/07/12 0450  HGB 11.6*  --   < >  --   --  11.8*  --  11.7*  --  12.0  --  12.9  --   --  12.7  WBC 19.1*  --   < >  --   --  13.3*  --  12.6*  --  13.7*  --  17.4*  --   --  15.4*  PLT 300  --   < >  --   --  314  --  279  --  254  --  278  --   --  236  NA 137  --   < >  --   --  145  < > 150*  --  150*  --  149*  --   --  150*  K 4.1  --   < >  --   --  2.5*  < > 3.4*  --  3.4*  --  3.4*  --   --  3.5  CL 96  --   < >  --   --  100  < > 107  --  108  --  107  --   --  107  CO2 29  --   < >  --   --  33*  < > 33*  --  32  --  32  --   --  31  GLUCOSE 117*  --   < >  --   --  108*  < > 96  --  120*  --  159*  --   --  148*  BUN 39*  --   < >  --   --  34*  < > 35*  --  30*  --  35*  --   --  42*  CREATININE 0.87  --   < >  --   --  0.78  < > 0.67  --  0.70  --  0.74  --   --  0.80  CALCIUM 9.8  --   < >  --   --  9.9  < > 9.6  --  10.3  --  10.2  --   --  10.4  MG 2.8*  --   < >  --   --   --   --   --   --  2.4  --  2.4  --   --  2.5  PHOS 6.4*  --   < >  --   --   --   --   --   --  5.1*  --  4.6  --    --  5.3*  AST 12  --   --   --   --   --   --   --   --   --   --   --   --   --   --   ALT 14  --   --   --   --   --   --   --   --   --   --   --   --   --   --   ALKPHOS 66  --   --   --   --   --   --   --   --   --   --   --   --   --   --   BILITOT 0.6  --   --   --   --   --   --   --   --   --   --   --   --   --   --   PROT 7.5  --   --   --   --   --   --   --   --   --   --   --   --   --   --  ALBUMIN 3.3*  --   --   --   --   --   --   --   --   --   --   --   --   --   --   APTT 21*  --   --   --   --   --   --   --   --   --   --   --   --   --   --   INR 1.06  --   --   --   --   --   --   --   --   --   --   --   --   --   --   LATICACIDVEN  --  0.9  --   --   --   --   --   --   --   --   --   --   --   --   --   PROCALCITON  --   --   --  <0.10  --  0.12  --  <0.10  --   --   --   --   --   --   --   PROBNP 454.0*  --   --   --   --   --   --   --   --   --   --   --   --   --   --   PHART  --   --   < >  --   < >  --   < >  --   < >  --  7.353  --  7.313* 7.300*  --   PCO2ART  --   --   < >  --   < >  --   < >  --   < >  --  58.3*  --  67.1* 68.4*  --   PO2ART  --   --   < >  --   < >  --   < >  --   < >  --  88.0  --  76.2* 81.5  --   < > = values in this interval not displayed.  Recent Labs Lab 12/06/12 1535 12/06/12 2018 12/07/12 0014 12/07/12 0418 12/07/12 0819  GLUCAP 158* 153* 123* 154* 138*    CXR: 11/13 - ETT pulled back some, at levels of clavicles.  Bilateral lower lobe collapse.  ASSESSMENT / PLAN:  PULMONARY A:  VDRF -  Secondary to fluid overload with CHF and anasarca.  Concern was for PNA on presentation.   Mild resp acidosis per 11/12 ABG OSA? CTA neg for PE. P:   - TC as tolerated full vent support at night due to obesity hypoventilation syndrome then TC again in AM. - Neg balance goals, tolerated thus far. - Trached and doing well. - 2D echo with contrast for ?shunt was inconclusive.  CARDIOVASCULAR A:  CHF by history. P:  - 2D  echo noted but unfortunately was a difficult study to assess for shunt. - D/C lasix drip, Lasix as per renal function.  RENAL A: Anasarca, renal function holding. Hypokalemia P:   - Replace K as needed. - Trend BMP in pm. - KVO IVF. - Lasix 40 mg IV q6 hours x3 doses. - Diamox as ordered. - Free water for hypernatremia.  GASTROINTESTINAL A: Obese P:   -  NGT. - TF as ordered.  HEMATOLOGIC A:   No active issues. P:  - Trend CBC. - Lovenox for DVT prophylaxis.  INFECTIOUS A:   ? HCAP - PCT neg, bacterial infxn unlikely. P:   - D/Ced zosyn. - F/U cultures all negative. - CT unimpressive.  ENDOCRINE A:   DM Relative AI? - cortisol 3.2 inaccurate and not in shock P:   - SSI. - Inaccurate cortisol and no pressor, dced steroids. - If drops bp, add florinef.  NEUROLOGIC A:   No active issues. P:   - Fentanyl PRN. - Mobilize as able.  Tolerated PEEP and FiO2 drop, will start TC trials, if tolerated will continue.  TC during the day and vent support at night as above.  Lasix and K as ordered.  Continue free water for hypernatremia.  Transfer to vent bed.  Ccm time 35 min   Alyson Reedy, M.D. [facility] Pulmonary/Critical Care Medicine. Pager: [phone]. After hours pager: [phone].

## 2012-12-07 NOTE — Progress Notes (Signed)
Pt placed back on ventilator due to fatique and desaturations .  Pt tolerated Trach collar for 6.5 hours. Pt will remain on the vent the rest of the day and night and we will try again on trach collar in the morning. Respiratory will continue to follow patient.

## 2012-12-07 NOTE — Progress Notes (Signed)
Pt placed on trach collar of 60% O2 and tolerating now.  Pt will need nocturnal ventilation tonight. Respiratory Therapy will continue to monitor.

## 2012-12-08 ENCOUNTER — Inpatient Hospital Stay (HOSPITAL_COMMUNITY): Payer: BC Managed Care – PPO

## 2012-12-08 DIAGNOSIS — Z930 Tracheostomy status: Secondary | ICD-10-CM

## 2012-12-08 LAB — CBC
HCT: 43.6 % (ref 36.0–46.0)
Hemoglobin: 12.4 g/dL (ref 12.0–15.0)
MCH: 22.4 pg — ABNORMAL LOW (ref 26.0–34.0)
MCHC: 28.4 g/dL — ABNORMAL LOW (ref 30.0–36.0)
MCV: 78.8 fL (ref 78.0–100.0)
RDW: 21.8 % — ABNORMAL HIGH (ref 11.5–15.5)
WBC: 11.7 10*3/uL — ABNORMAL HIGH (ref 4.0–10.5)

## 2012-12-08 LAB — BASIC METABOLIC PANEL
BUN: 42 mg/dL — ABNORMAL HIGH (ref 6–23)
Calcium: 10.3 mg/dL (ref 8.4–10.5)
Chloride: 105 mEq/L (ref 96–112)
Creatinine, Ser: 0.68 mg/dL (ref 0.50–1.10)
GFR calc Af Amer: >90 mL/min (ref >90)
GFR calc non Af Amer: >90 mL/min (ref >90)
Glucose, Bld: 144 mg/dL — ABNORMAL HIGH (ref 70–99)
Potassium: 3.3 mEq/L — ABNORMAL LOW (ref 3.5–5.1)

## 2012-12-08 LAB — GLUCOSE, CAPILLARY
Glucose-Capillary: 122 mg/dL — ABNORMAL HIGH (ref 70–99)
Glucose-Capillary: 127 mg/dL — ABNORMAL HIGH (ref 70–99)
Glucose-Capillary: 140 mg/dL — ABNORMAL HIGH (ref 70–99)
Glucose-Capillary: 155 mg/dL — ABNORMAL HIGH (ref 70–99)
Glucose-Capillary: 162 mg/dL — ABNORMAL HIGH (ref 70–99)

## 2012-12-08 MED ORDER — FUROSEMIDE 10 MG/ML IJ SOLN
40.0000 mg | Freq: Every day | INTRAMUSCULAR | Status: DC
  Administered 2012-12-08 – 2012-12-09 (×2): 40 mg via INTRAVENOUS
  Filled 2012-12-08 (×2): qty 4

## 2012-12-08 MED ORDER — ALBUTEROL SULFATE (5 MG/ML) 0.5% IN NEBU: 2.5000 mg | INHALATION_SOLUTION | RESPIRATORY_TRACT | Status: DC | PRN

## 2012-12-08 MED ORDER — POTASSIUM CHLORIDE 20 MEQ/15ML (10%) PO LIQD
40.0000 meq | ORAL | Status: AC
  Administered 2012-12-08 (×2): 40 meq
  Filled 2012-12-08 (×2): qty 30

## 2012-12-08 MED ORDER — LOSARTAN POTASSIUM 50 MG PO TABS
100.0000 mg | ORAL_TABLET | Freq: Every day | ORAL | Status: DC
  Administered 2012-12-08 – 2012-12-09 (×2): 100 mg
  Filled 2012-12-08 (×2): qty 2

## 2012-12-08 NOTE — Progress Notes (Signed)
Pt placed on 60% ATC this am and has been tolerating well. Pt will require vent support for night for hypoventilation and ATC during the day as tolerated, RT will continue to monitor patient.

## 2012-12-08 NOTE — Progress Notes (Signed)
75cc Fentanyl from infusion wasted with M.Cross RN.  Doree Albee

## 2012-12-08 NOTE — Progress Notes (Signed)
SLP Cancellation Note  Patient Details Name: Meghan Hoover MRN: [id number] DOB: [birth year]   Cancelled treatment:       Reason Eval/Treat Not Completed: Medical issues which prohibited therapy.  Per chart patient has returned to full vent support for the evening.  Will attempt to see patient on next date during trach collar trials.   Fae Pippin, M.A., CCC-SLP [phone]  Colin Ellers 12/08/2012, 4:18 PM

## 2012-12-08 NOTE — Progress Notes (Signed)
12/08/2012- 1455- Resp Care Note- Pt weaned for approx 6hrs today.  Pt placed back on full vent support due to fatigue and desaturations.  Will cont to monitor pt and wean when tolerated.  Pt tolerating full support well.   S Adamariz Gillott rrt, rcp

## 2012-12-08 NOTE — Progress Notes (Signed)
PULMONARY  / CRITICAL CARE MEDICINE  Name: Meghan Hoover MRN: [id number] DOB: 1960-07-18    ADMISSION DATE:  11/19/2012 CONSULTATION DATE:  12/01/12  REFERRING MD :  Dr. Kerry Hough PRIMARY SERVICE: PCCM  CHIEF COMPLAINT:  Shortness of breath  BRIEF PATIENT DESCRIPTION: 52 y.o. Current smoker  With PMH of HTN presented to [facility] ED 10/30 with shortness of breath and massive edema.  Patient admitted to [facility] ICU and treated for CHF and CAP.  Patient to be transferred to [facility] for trach and further care.    SIGNIFICANT EVENTS / STUDIES:  10/30 admitted [facility] for fluid overload and respiratory failure. 11/11 transfer to [facility] for tracheostomy and further care. 11/12 Chest CTA >>> neg for PE, volume loss bilateral lower lobes, cholelithiasis. 11/14 TTE >> diastolic dysfxn, EF 55-60%, normal RV fxn. PAP's not estimated 11/15 PEEP down to 5 and well tolerated 11/16 begin PS trials.  LINES / TUBES: L [place] TLC 11/10>>> PICC line unknown placement date>>>11/10 ET tube 10/30>>>11/14 Trach 11/14 (DF)>>>changed to extra long 11/17>>> NGT 10/30>>> Foley 10/30 >>> Rectal tube 11/11>>>  CULTURES: C. Diff 11/10>>>NTD. Urine 11/10>>>neg Blood 11/10>>>NTD Blood 11/11>>>NTD Sputum 11/1>>>neg  ANTIBIOTICS: Vanc 11/1-11/7, 11/10>>> 11/12 Zosyn 11/11>>>11/13  SUBJECTIVE:  Did ATC for several hours 11/17, then back to [name] when fatigued Up to chair this am  VITAL SIGNS: Temp:  [98.3 F (36.8 C)-99.6 F (37.6 C)] 98.3 F (36.8 C) (11/18 0353) Pulse Rate:  [32-123] 100 (11/18 0700) Resp:  [12-23] 16 (11/18 0800) BP: (106-155)/(69-104) 140/87 mmHg (11/18 0800) SpO2:  [89 %-100 %] 97 % (11/18 0700) FiO2 (%):  [40 %-60 %] 40 % (11/18 0339)  HEMODYNAMICS: CVP:  [6 mmHg-8 mmHg] 7 mmHg  VENTILATOR SETTINGS: Vent Mode:  [-] PRVC FiO2 (%):  [40 %-60 %] 40 % Set Rate:  [14 bmp] 14 bmp Vt Set:  [510 mL] 510 mL PEEP:  [5 cmH20] 5 cmH20 Plateau Pressure:  [18 cmH20-19 cmH20] 18 cmH20  INTAKE /  OUTPUT: Intake/Output     11/17 0701 - 11/18 0700 11/18 0701 - 11/19 0700   I.V. (mL/kg) 547.5 (3.1)    Other 45    NG/GT 3095    IV Piggyback     Total Intake(mL/kg) 3687.5 (21)    Urine (mL/kg/hr) 2700 (0.6) 75 (0.3)   Stool 400 (0.1)    Total Output 3100 75   Net +587.5 -75         PHYSICAL EXAMINATION: General:  Chronically ill appearing morbidly obese female. Up to chair Neuro:  awake, follows commands, moves all ext to command. HEENT:  [place]/AT, PERRL, EOM-I and MMM. Trach site CDI Cardiovascular:  RRR, No M/R/G appreciated with respect to body habitus. Lungs:  Coarse breath sounds throughout. Decreased at bases Abdomen:  Soft, NT, ND and +BS. Musculoskeletal:  Minimal edema, non-pitting. Skin:  Intact.  LABS:  Recent Labs Lab 12/01/12 1144 12/01/12 1145  12/02/12 1242  12/03/12 0430  12/04/12 0410  12/05/12 0410 12/06/12 0433 12/06/12 0500 12/07/12 0444 12/07/12 0450 12/08/12 0439  HGB 11.6*  --   < >  --   --  11.8*  --  11.7*  < >  --  12.9  --   --  12.7 12.4  WBC 19.1*  --   < >  --   --  13.3*  --  12.6*  < >  --  17.4*  --   --  15.4* 11.7*  PLT 300  --   < >  --   --  314  --  279  < >  --  278  --   --  236 211  NA 137  --   < >  --   --  145  < > 150*  < >  --  149*  --   --  150* 147*  K 4.1  --   < >  --   --  2.5*  < > 3.4*  < >  --  3.4*  --   --  3.5 3.3*  CL 96  --   < >  --   --  100  < > 107  < >  --  107  --   --  107 105  CO2 29  --   < >  --   --  33*  < > 33*  < >  --  32  --   --  31 32  GLUCOSE 117*  --   < >  --   --  108*  < > 96  < >  --  159*  --   --  148* 144*  BUN 39*  --   < >  --   --  34*  < > 35*  < >  --  35*  --   --  42* 42*  CREATININE 0.87  --   < >  --   --  0.78  < > 0.67  < >  --  0.74  --   --  0.80 0.68  CALCIUM 9.8  --   < >  --   --  9.9  < > 9.6  < >  --  10.2  --   --  10.4 10.3  MG 2.8*  --   < >  --   --   --   --   --   < >  --  2.4  --   --  2.5 2.4  PHOS 6.4*  --   < >  --   --   --   --   --   < >  --  4.6   --   --  5.3* 4.0  AST 12  --   --   --   --   --   --   --   --   --   --   --   --   --   --   ALT 14  --   --   --   --   --   --   --   --   --   --   --   --   --   --   ALKPHOS 66  --   --   --   --   --   --   --   --   --   --   --   --   --   --   BILITOT 0.6  --   --   --   --   --   --   --   --   --   --   --   --   --   --   PROT 7.5  --   --   --   --   --   --   --   --   --   --   --   --   --   --  ALBUMIN 3.3*  --   --   --   --   --   --   --   --   --   --   --   --   --   --   APTT 21*  --   --   --   --   --   --   --   --   --   --   --   --   --   --   INR 1.06  --   --   --   --   --   --   --   --   --   --   --   --   --   --   LATICACIDVEN  --  0.9  --   --   --   --   --   --   --   --   --   --   --   --   --   PROCALCITON  --   --   --  <0.10  --  0.12  --  <0.10  --   --   --   --   --   --   --   PROBNP 454.0*  --   --   --   --   --   --   --   --   --   --   --   --   --   --   PHART  --   --   < >  --   < >  --   < >  --   < > 7.353  --  7.313* 7.300*  --   --   PCO2ART  --   --   < >  --   < >  --   < >  --   < > 58.3*  --  67.1* 68.4*  --   --   PO2ART  --   --   < >  --   < >  --   < >  --   < > 88.0  --  76.2* 81.5  --   --   < > = values in this interval not displayed.  Recent Labs Lab 12/07/12 1553 12/07/12 2001 12/08/12 0026 12/08/12 0352 12/08/12 0830  GLUCAP 130* 149* 140* 162* 155*    CXR: 11/18 > bibasilar atx, CM, no significant edema pattern.  ASSESSMENT / PLAN:  PULMONARY A:  Acute on chronic resp failure VDRF -  Secondary to fluid overload with CHF and anasarca.  Concern was for PNA on presentation that was treated empirically. CTA neg for PE. OSA + OHS Intact RV fxn on TTE 11/14, no shunt  P:   - TC as tolerated full vent support at night due to obesity hypoventilation syndrome then TC again in AM. Would like to transition maintenance/ nocturnal mode to PSV. Goal will be to wean vent completely and use trach uncapped at night  for her OSA - Neg balance goals, large diuresis tolerated thus far (-35.8 L for the hospitalization). Start lasix 40mg  qd and adjust in next several days to provide O > I - change BD's to prn only  CARDIOVASCULAR A:  HTN CHF by history, diastolic dysfxn on TTE 11/14; RV fxn intact.  Probable secondary PAH, PAP's not estimated on TTE P:  - restart lower dose lasix with goal  mild O>I - restart losartan, home dose  RENAL A: Anasarca, renal function holding. Hypokalemia P:   - Replace K as needed. - Trend BMP in pm. - Free water for hypernatremia.  GASTROINTESTINAL A: Obese Nutrition P:   - NGT. - TF as ordered. - swallow eval when ready for cuff down  HEMATOLOGIC A:   No active issues. P:  - Trend CBC. - Lovenox for DVT prophylaxis.  INFECTIOUS A:   ? HCAP - PCT neg, bacterial infxn unlikely. P:   - D/Ced zosyn. - F/U cultures all negative.  ENDOCRINE A:   DM Relative AI? - cortisol 3.2 inaccurate and not in shock P:   - SSI. - If drops bp, add florinef.  NEUROLOGIC A:   No active issues. P:   - Fentanyl PRN. - Mobilize as able.  Global: goal transfer to vent SDU bed  CC time 35 minutes  Levy Pupa, MD, PhD 12/08/2012, 8:54 AM [place] [facility] [facility] [phone] or if no answer [phone]

## 2012-12-09 LAB — BASIC METABOLIC PANEL
BUN: 42 mg/dL — ABNORMAL HIGH (ref 6–23)
Calcium: 10.1 mg/dL (ref 8.4–10.5)
GFR calc Af Amer: >90 mL/min (ref >90)
GFR calc non Af Amer: >90 mL/min (ref >90)
Glucose, Bld: 128 mg/dL — ABNORMAL HIGH (ref 70–99)
Potassium: 3.2 mEq/L — ABNORMAL LOW (ref 3.5–5.1)
Sodium: 148 mEq/L — ABNORMAL HIGH (ref 135–145)

## 2012-12-09 LAB — GLUCOSE, CAPILLARY
Glucose-Capillary: 124 mg/dL — ABNORMAL HIGH (ref 70–99)
Glucose-Capillary: 132 mg/dL — ABNORMAL HIGH (ref 70–99)

## 2012-12-09 MED ORDER — LOSARTAN POTASSIUM 100 MG PO TABS: 100.0000 mg | ORAL_TABLET | Freq: Every day | ORAL | Status: DC

## 2012-12-09 MED ORDER — FUROSEMIDE 40 MG PO TABS: 40.0000 mg | ORAL_TABLET | Freq: Every day | ORAL | Status: DC

## 2012-12-09 MED ORDER — ONDANSETRON HCL 4 MG/2ML IJ SOLN: 4.0000 mg | Freq: Four times a day (QID) | INTRAMUSCULAR | Status: DC | PRN

## 2012-12-09 MED ORDER — ALBUTEROL SULFATE (5 MG/ML) 0.5% IN NEBU: 2.5000 mg | INHALATION_SOLUTION | RESPIRATORY_TRACT | Status: DC | PRN

## 2012-12-09 MED ORDER — VITAL HIGH PROTEIN PO LIQD: ORAL | Status: DC

## 2012-12-09 MED ORDER — INSULIN ASPART 100 UNIT/ML ~~LOC~~ SOLN: SUBCUTANEOUS | Status: DC

## 2012-12-09 MED ORDER — ACETAMINOPHEN 650 MG RE SUPP: 650.0000 mg | RECTAL | Status: DC | PRN

## 2012-12-09 MED ORDER — ADULT MULTIVITAMIN LIQUID CH: 5.0000 mL | Freq: Every day | ORAL | Status: DC

## 2012-12-09 MED ORDER — PRO-STAT SUGAR FREE PO LIQD: 30.0000 mL | Freq: Two times a day (BID) | ORAL | Status: DC

## 2012-12-09 MED ORDER — ACETAMINOPHEN 325 MG PO TABS: 650.0000 mg | ORAL_TABLET | ORAL | Status: DC | PRN

## 2012-12-09 MED ORDER — FREE WATER: 300.0000 mL | Status: DC

## 2012-12-09 MED ORDER — PANTOPRAZOLE SODIUM 40 MG PO PACK: 40.0000 mg | PACK | Freq: Every day | ORAL | Status: DC

## 2012-12-09 MED ORDER — ASPIRIN 81 MG PO CHEW: 81.0000 mg | CHEWABLE_TABLET | Freq: Every day | ORAL | Status: DC

## 2012-12-09 NOTE — Discharge Summary (Signed)
Physician Discharge Summary  Patient ID: Meghan Hoover MRN: [id number] DOB/AGE: [birth year] 52 y.o.  Admit date: 11/19/2012 Discharge date: 12/09/2012    Discharge Diagnoses:  Principal Problem:   Acute respiratory failure with hypoxia Active Problems:   Volume overload   Bilateral lower extremity edema   HTN (hypertension)   Normocytic anemia   Tobacco dependence   Acute diastolic congestive heart failure   CAP (community acquired pneumonia)   Sepsis   Tracheostomy status    Brief Summary: Meghan Hoover is a 52 y.o. y/o female with a PMH of HTN initially admitted to [facility] 10/30 with SOB and massive edema, intubated and treated for CHF and CAP and ultimately tx 11/11 to [facility] for consideration for tracheostomy and further vent wean.   Treated with mechanical ventilation, aggressive diuresis, antibiotics as below, nebulized bronchodilators.  Course c/b fluid overload, CHF, underlying obesity and presumed OHS/OSA, ?HCAP. She was followed closely by cardiology at [facility] for assistance with diuresis  Cultures remain negative to date.  She underwent tracheostomy placement 11/14 and slowly weaned to daytime ATC.  She is currently tolerating daytime ATC with qhs full support.  Goal would be PSV as qhs support and ultimately wean completely to daytime capped and uncap at night r/t OSA.     SIGNIFICANT EVENTS / STUDIES:  10/30 admitted [facility] for fluid overload and respiratory failure.  11/11 transfer to [facility] for tracheostomy and further care.  11/12 Chest CTA >>> neg for PE, volume loss bilateral lower lobes, cholelithiasis.  11/14 TTE >> diastolic dysfxn, EF 55-60%, normal RV fxn. PAP's not estimated  11/15 PEEP down to 5 and well tolerated  11/16 begin PS trials.   LINES / TUBES:  L [place] TLC 11/10>>>  PICC line unknown placement date>>>11/10  ET tube 10/30>>>11/14  Trach 11/14 (DF)>>>changed to extra long 11/17>>>  NGT 10/30>>>  Foley 10/30 >>>  Rectal tube 11/11>>>    CULTURES:  C. Diff 11/10>>>NTD.  Urine 11/10>>>neg  Blood 11/10>>>NTD  Blood 11/11>>>NTD  Sputum 11/1>>>neg   ANTIBIOTICS:  Vanc 11/1-11/7, 11/10>>> 11/12  Zosyn 11/11>>>11/13                                                                    Hospital Summary by Discharge Diagnosis   Acute on chronic resp failure  VDRF - Secondary to fluid overload with CHF and anasarca. Concern was for PNA on presentation that was treated empirically. CTA neg for PE.  OSA + OHS  Intact RV fxn on TTE 11/14, no shunt  ? HCAP - PCT neg, bacterial infxn unlikely.  PLAN -  - TC as tolerated full vent support at night due to obesity hypoventilation syndrome then TC again in AM. Would like to transition maintenance/ nocturnal mode to PSV. Goal will be to wean vent completely and use trach uncapped at night for her OSA  - Neg balance goals, large diuresis tolerated thus far (-35.8 L for the hospitalization). Change lasix to per tube, 40mg  qday and adjust as needed to provide O > I as renal fnx tolerates  - BD's prn  - NO DECANNULATION unless significant weight loss and after coordination with pulmonary/trach clinic  -will f/u in trach clinic post d/c from [facility] for trach  management  -abx complete   HTN  CHF by history, diastolic dysfxn on TTE 11/14; RV fxn intact.  Probable secondary PAH, PAP's not estimated on TTE  PLAN -  - cont low dose lasix with goal mild O>I - adjust as needed  - cont home dose losartan   Anasarca, renal function holding.  Hypokalemia  Hypernatremia  PLAN -  - Replace K as needed.  - Trend BMP in pm.  - Free water for hypernatremia.   Obese  Nutrition  PLAN -  - NGT.  - cont TF  - swallow eval when ready for cuff down   DM  Relative AI? - improved  PLAN -  - SSI.  - If drops bp, add florinef.   Filed Vitals:   12/09/12 0544 12/09/12 0817 12/09/12 0840 12/09/12 1114  BP:  128/74 128/74   Pulse: 81 78 79 84  Temp:  98.5 F (36.9 C)    TempSrc:  Oral     Resp: 21 18 23 28   Height:      Weight:      SpO2: 96% 97% 95% 96%     Discharge Labs  BMET  Recent Labs Lab 12/05/12 0346 12/06/12 0433 12/07/12 0450 12/08/12 0439 12/09/12 0510  NA 150* 149* 150* 147* 148*  K 3.4* 3.4* 3.5 3.3* 3.2*  CL 108 107 107 105 107  CO2 32 32 31 32 30  GLUCOSE 120* 159* 148* 144* 128*  BUN 30* 35* 42* 42* 42*  CREATININE 0.70 0.74 0.80 0.68 0.63  CALCIUM 10.3 10.2 10.4 10.3 10.1  MG 2.4 2.4 2.5 2.4  --   PHOS 5.1* 4.6 5.3* 4.0  --      CBC   Recent Labs Lab 12/06/12 0433 12/07/12 0450 12/08/12 0439  HGB 12.9 12.7 12.4  HCT 45.7 43.9 43.6  WBC 17.4* 15.4* 11.7*  PLT 278 236 211   Anti-Coagulation No results found for this basename: INR,  in the last 168 hours        Follow-up Information   Follow up with Kirk Ruths, MD. Schedule an appointment as soon as possible for a visit in 2 weeks. (post d/c from [facility] )    Specialty:  Family Medicine   Contact information:   [address] [place] [address] [phone]       Call Trach Clinic . (call when d/c from [facility] to f/u in trach clinic )    Contact information:   [facility] [phone]        Medication List    STOP taking these medications       betamethasone dipropionate 0.05 % cream  Commonly known as:  DIPROLENE     esomeprazole 40 MG capsule  Commonly known as:  NEXIUM     methocarbamol 500 MG tablet  Commonly known as:  ROBAXIN      TAKE these medications       acetaminophen 650 MG suppository  Commonly known as:  TYLENOL  Place 1 suppository (650 mg total) rectally every 4 (four) hours as needed for fever.     acetaminophen 325 MG tablet  Commonly known as:  TYLENOL  Take 2 tablets (650 mg total) by mouth every 4 (four) hours as needed for mild pain.     albuterol (5 MG/ML) 0.5% nebulizer solution  Commonly known as:  PROVENTIL  Take 0.5 mLs (2.5 mg total) by nebulization  every 4 (four) hours  as needed for wheezing or shortness of breath.     aspirin 81 MG chewable tablet  Chew 1 tablet (81 mg total) by mouth daily.     feeding supplement (PRO-STAT SUGAR FREE 64) Liqd  Place 30 mLs into feeding tube 2 (two) times daily.     feeding supplement (VITAL HIGH PROTEIN) Liqd liquid  Continuous 60ml/hr     free water Soln  Place 300 mLs into feeding tube every 4 (four) hours.     furosemide 40 MG tablet  Commonly known as:  LASIX  Place 1 tablet (40 mg total) into feeding tube daily.     insulin aspart 100 UNIT/ML injection  Commonly known as:  novoLOG  - Sq sliding scale Every 4 hours   - Correction coverage: Standard Scale  - CBG < 70: implement Hypoglycemia Protocol  - CBG 121 - 150: 2 units  - CBG 151 - 200: 4 units  - CBG 201 - 250: 6 units  - >250 call MD     losartan 100 MG tablet  Commonly known as:  COZAAR  Place 1 tablet (100 mg total) into feeding tube daily.     multivitamin Liqd  Take 5 mLs by mouth daily.     ondansetron 4 MG/2ML Soln injection  Commonly known as:  ZOFRAN  Inject 2 mLs (4 mg total) into the vein every 6 (six) hours as needed for nausea.     pantoprazole sodium 40 mg/20 mL Pack  Commonly known as:  PROTONIX  Place 20 mLs (40 mg total) into feeding tube daily.         Disposition: [facility]   Discharged Condition: Meghan Hoover has met maximum benefit of inpatient care and is medically stable and cleared for discharge.  Patient is pending follow up as above.      Time spent on disposition:  Greater than 35 minutes.   SignedDanford Bad, NP 12/09/2012  11:49 AM Pager: [phone] or [phone]  *Care during the described time interval was provided by me and/or other providers on the critical care team. I have reviewed this patient's available data, including medical history, events of note, physical examination and test results as part of my evaluation.

## 2012-12-09 NOTE — Progress Notes (Signed)
NUTRITION FOLLOW UP  Intervention:    Continue current EN regimen RD to follow for nutrition care plan  Nutrition Dx:   Inadequate oral intake related to inability to eat as evidenced by NPO status, ongoing  Goal: EN to provide 60-70% of estimated calorie needs (22-25 kcals/kg ideal body weight) and 100% of estimated protein needs, based on ASPEN guidelines for permissive underfeeding in critically ill obese individuals, met  Monitor:   EN regimen & tolerance, respiratory status, weight, labs, I/O's  Assessment:   Patient is a 52 year old female, current smoker with PMH of HTN; [facility] with shortness of breath and massive edema. Patient admitted to [facility] ICU and treated for CHF and CAP. Patient was transferred to [facility] on 11/11 for trach and further care.  Patient transferred to 2C-Stepdown from 7M-Medical ICU 11/18.  Patient is currently on nocturnal ventilatory support -- trach MV: 10.2 Temp: 36.9  Vital HP formula infusing at 65 ml/h via NGT (tip in distal stomach) with Prostat 30 ml BID providing 1760 kcals (68% estimated kcal needs), 167 gm protein (100% of estimated protein needs), 1304 ml free water daily.  Free water flushes at 300 ml every 4 hours.  Height: Ht Readings from Last 1 Encounters:  12/03/12 5\' 8"  (1.727 m)    Weight Status ---> trending down, + diuresis  Wt Readings from Last 1 Encounters:  12/07/12 387 lb 5.6 oz (175.7 kg)    11/14 370 lb 11/12 370 lb 11/11 377 lb 11/10 381 lb 11/09 383 lb  Body mass index is 58.91 kg/(m^2).  Re-estimated needs:  Kcal: 2577 Protein: 159 gm Fluid: per MD  Skin: Stage II pressure ulcer on R ankle  Diet Order: NPO   Intake/Output Summary (Last 24 hours) at 12/09/12 1000 Last data filed at 12/09/12 0800  Gross per 24 hour  Intake   2870 ml  Output   1925 ml  Net    945 ml    Labs:   Recent Labs Lab 12/06/12 0433 12/07/12 0450 12/08/12 0439 12/09/12 0510  NA 149* 150* 147*  148*  K 3.4* 3.5 3.3* 3.2*  CL 107 107 105 107  CO2 32 31 32 30  BUN 35* 42* 42* 42*  CREATININE 0.74 0.80 0.68 0.63  CALCIUM 10.2 10.4 10.3 10.1  MG 2.4 2.5 2.4  --   PHOS 4.6 5.3* 4.0  --   GLUCOSE 159* 148* 144* 128*    CBG (last 3)   Recent Labs  12/09/12 0031 12/09/12 0431 12/09/12 0820  GLUCAP 124* 132* 132*    Scheduled Meds: . antiseptic oral rinse  15 mL Mouth Rinse QID  . aspirin  81 mg Oral Daily  . chlorhexidine  15 mL Mouth Rinse BID  . enoxaparin (LOVENOX) injection  80 mg Subcutaneous Q24H  . feeding supplement (PRO-STAT SUGAR FREE 64)  30 mL Per Tube BID  . fentaNYL  200 mcg Intravenous Once  . free water  300 mL Per Tube Q4H  . furosemide  40 mg Intravenous Daily  . insulin aspart  2-6 Units Subcutaneous Q4H  . losartan  100 mg Per Tube Daily  . multivitamin  5 mL Oral Daily  . pantoprazole sodium  40 mg Per Tube Daily    Continuous Infusions: . sodium chloride 20 mL/hr at 12/09/12 0800  . feeding supplement (VITAL HIGH PROTEIN) 1,000 mL (12/09/12 0800)    Maureen Chatters, RD, LDN Pager #: [phone] After-Hours Pager #: [phone]

## 2012-12-09 NOTE — Progress Notes (Signed)
Trach secured, sutures still in place, trach site is red. No break down at trach site.

## 2012-12-09 NOTE — Progress Notes (Signed)
Trach secured. Trach site red under flange.

## 2012-12-09 NOTE — Progress Notes (Signed)
Trach secure. Trach site red with sutures still in place

## 2012-12-10 LAB — GLUCOSE, CAPILLARY: Glucose-Capillary: 120 mg/dL — ABNORMAL HIGH (ref 70–99)

## 2012-12-10 NOTE — Progress Notes (Signed)
UR Completed.  Meghan Hoover [phone] 12/10/2012

## 2012-12-19 NOTE — Discharge Summary (Signed)
Patient seen and examined, agree with above note.  I dictated the care and orders written for this patient under my direction.  Donika Butner G Jyren Cerasoli, MD [phone] 

## 2013-02-08 ENCOUNTER — Other Ambulatory Visit (HOSPITAL_COMMUNITY): Payer: Self-pay | Admitting: Family Medicine

## 2013-02-08 ENCOUNTER — Ambulatory Visit (HOSPITAL_COMMUNITY)
Admission: RE | Admit: 2013-02-08 | Discharge: 2013-02-08 | Disposition: A | Discharge status: Home / Self Care | Payer: BC Managed Care – PPO | Source: Ambulatory Visit | Attending: Family Medicine | Admitting: Family Medicine

## 2013-02-08 DIAGNOSIS — IMO0002 Reserved for concepts with insufficient information to code with codable children: Secondary | ICD-10-CM

## 2013-02-08 DIAGNOSIS — M25519 Pain in unspecified shoulder: Secondary | ICD-10-CM | POA: Insufficient documentation

## 2013-02-08 DIAGNOSIS — M75100 Unspecified rotator cuff tear or rupture of unspecified shoulder, not specified as traumatic: Secondary | ICD-10-CM

## 2013-02-08 DIAGNOSIS — M898X9 Other specified disorders of bone, unspecified site: Secondary | ICD-10-CM | POA: Insufficient documentation

## 2013-03-01 ENCOUNTER — Encounter: Payer: Self-pay | Admitting: Cardiology

## 2013-03-01 ENCOUNTER — Ambulatory Visit (INDEPENDENT_AMBULATORY_CARE_PROVIDER_SITE_OTHER): Payer: BC Managed Care – PPO | Admitting: Cardiology

## 2013-03-01 VITALS — BP 132/82 | HR 85 | Ht 68.0 in | Wt 363.0 lb

## 2013-03-01 DIAGNOSIS — I50810 Right heart failure, unspecified: Secondary | ICD-10-CM

## 2013-03-01 DIAGNOSIS — I509 Heart failure, unspecified: Secondary | ICD-10-CM

## 2013-03-01 DIAGNOSIS — I5032 Chronic diastolic (congestive) heart failure: Secondary | ICD-10-CM

## 2013-03-01 DIAGNOSIS — R0602 Shortness of breath: Secondary | ICD-10-CM

## 2013-03-01 MED ORDER — DILTIAZEM HCL ER 180 MG PO CP24: 180.0000 mg | ORAL_CAPSULE | Freq: Every day | ORAL | Status: DC

## 2013-03-01 NOTE — Progress Notes (Signed)
Clinical Summary Meghan Hoover is a 53 y.o.female seen today as a hospital follow up appointment.  1. Respiratory failure - recent admit 11/2012 with hypoxic and hypercapneic respiratory failure requiring intubation - Echo showed normal LV systolic function, elevated LA pressures, and RV pressure/volume overload with normal function, PASP 50 mmHg. She has multiple potential etiologies for her right sided dysfunction including diastolic dysfunction, OSA, obesity hypovent, and COPD - she diuresed >20 liters during admission, discharge weight approx 387 lbs.  - required tracheostomy due to long ventilator course, now removed.   2. Chronic diastolic heart failure - has chronic SOB, DOE at 60 feet which is somewhat worst after discharge. No orthopnea, does note some occasional LE edema.  - compliant with meds, reports she still repsonds to lasix - reports her dog passed away recently and she has been binge eating, she associated recent weight gain to this as opposed to fluid retention  3. Right sided heart failure - noted during recent admit, initial echo showed severely enlarged RV with D-shaped septum with preserved function. Repeat echoes as pulmonary status and fluid status removed showed normal sized RV and function.  4. OSA - compliant with CPAP machine.    Past Medical History  Diagnosis Date  . Hypertension   . GERD (gastroesophageal reflux disease)      Allergies  Allergen Reactions  . Augmentin [Amoxicillin-Pot Clavulanate] Other (See Comments)    thrush     Current Outpatient Prescriptions  Medication Sig Dispense Refill  . acetaminophen (TYLENOL) 325 MG tablet Take 2 tablets (650 mg total) by mouth every 4 (four) hours as needed for mild pain.      Marland Kitchen acetaminophen (TYLENOL) 650 MG suppository Place 1 suppository (650 mg total) rectally every 4 (four) hours as needed for fever.  12 suppository  0  . albuterol (PROVENTIL) (5 MG/ML) 0.5% nebulizer solution Take 0.5 mLs  (2.5 mg total) by nebulization every 4 (four) hours as needed for wheezing or shortness of breath.  20 mL  12  . Amino Acids-Protein Hydrolys (FEEDING SUPPLEMENT, PRO-STAT SUGAR FREE 64,) LIQD Place 30 mLs into feeding tube 2 (two) times daily.  900 mL  0  . aspirin 81 MG chewable tablet Chew 1 tablet (81 mg total) by mouth daily.      . furosemide (LASIX) 40 MG tablet Place 1 tablet (40 mg total) into feeding tube daily.  30 tablet    . insulin aspart (NOVOLOG) 100 UNIT/ML injection Sq sliding scale Every 4 hours  Correction coverage: Standard Scale CBG < 70: implement Hypoglycemia Protocol CBG 121 - 150: 2 units CBG 151 - 200: 4 units CBG 201 - 250: 6 units >250 call MD  1 vial  12  . losartan (COZAAR) 100 MG tablet Place 1 tablet (100 mg total) into feeding tube daily.      . Multiple Vitamin (MULTIVITAMIN) LIQD Take 5 mLs by mouth daily.      . Nutritional Supplements (FEEDING SUPPLEMENT, VITAL HIGH PROTEIN,) LIQD liquid Continuous 82ml/hr      . ondansetron (ZOFRAN) 4 MG/2ML SOLN injection Inject 2 mLs (4 mg total) into the vein every 6 (six) hours as needed for nausea.  2 mL  0  . pantoprazole sodium (PROTONIX) 40 mg/20 mL PACK Place 20 mLs (40 mg total) into feeding tube daily.  30 each    . Water For Irrigation, Sterile (FREE WATER) SOLN Place 300 mLs into feeding tube every 4 (four) hours.  No current facility-administered medications for this visit.     Past Surgical History  Procedure Laterality Date  . Achilles tendon surgery    . Back surgery       Allergies  Allergen Reactions  . Augmentin [Amoxicillin-Pot Clavulanate] Other (See Comments)    thrush      Family History  Problem Relation Age of Onset  . Diabetes Sister      Social History Ms. Maranan reports that she has been smoking Cigarettes.  She has been smoking about 2.00 packs per day. She does not have any smokeless tobacco history on file. Ms. Kramlich reports that she does not drink  alcohol.   Review of Systems CONSTITUTIONAL: No weight loss, fever, chills, weakness or fatigue.  HEENT: Eyes: No visual loss, blurred vision, double vision or yellow sclerae.No hearing loss, sneezing, congestion, runny nose or sore throat.  SKIN: No rash or itching.  CARDIOVASCULAR: per HPI RESPIRATORY: SOB.  GASTROINTESTINAL: No anorexia, nausea, vomiting or diarrhea. No abdominal pain or blood.  GENITOURINARY: No burning on urination, no polyuria NEUROLOGICAL: No headache, dizziness, syncope, paralysis, ataxia, numbness or tingling in the extremities. No change in bowel or bladder control.  MUSCULOSKELETAL: No muscle, back pain, joint pain or stiffness.  LYMPHATICS: No enlarged nodes. No history of splenectomy.  PSYCHIATRIC: No history of depression or anxiety.  ENDOCRINOLOGIC: No reports of sweating, cold or heat intolerance. No polyuria or polydipsia.  Marland Kitchen   Physical Examination p 85 bp 132/82 Wt 363 lbs BMI 55 Gen: resting comfortably, no acute distress HEENT: no scleral icterus, pupils equal round and reactive, no palptable cervical adenopathy,  CV: RRR, no m/r/g, no JVD, no carotid bruits Resp: Clear to auscultation bilaterally GI: abdomen is soft, non-tender, non-distended, normal bowel sounds, no hepatosplenomegaly MSK: extremities are warm, no edema.  Skin: warm, no rash Neuro:  no focal deficits Psych: appropriate affect   Diagnostic Studies 12/04/2012 Echo Technically difficult study, LVEF 01-75%, grade I diastolic dysfunction, mild AS, cannot estimate PASP, normal IVC, normal RV  11/24/12 Echo LVEF 55-60%, moderate RV dilatation with normal function  11/20/12 Echo LVEF 65-70%, grade II diastolic dysfunction, flattened RV septum in diastole and systole, severe RV dilatatoin, PASP 94mmHg  Assessment and Plan  1. Respiratory failure - long recent admission with respiratory failure, difficult to wean vent requiring tracheostomy. Now recovered, trach has been  removed - multiple likely pulmonary issues including likely COPD, OSA, and obesity hypovent - consider outpatient pulm follow up  2. Diastolic heart failure - diuresed >20 liters during recent admit. Discharge weight 387, now down to 362 though trending up. She attributes this to binge eating after the recent passing of her dog - continue current lasix, continue daily weights, instructed ok to increase lasix as needed for weight gain or increased SOB - request recent labs from PCP  3. SOB - she has not had any PFTs in our system, currently has prn albuterol as needed. History of prior tobacco use - will order PFTs   Follow up 3 months   Arnoldo Lenis, M.D., F.A.C.C.

## 2013-03-01 NOTE — Patient Instructions (Signed)
Your physician recommends that you schedule a follow-up appointment in: 3 months with Dr Harl Bowie    Your physician has recommended that you have a pulmonary function test. Pulmonary Function Tests are a group of tests that measure how well air moves in and out of your lungs.  Your physician recommends that you continue on your current medications as directed. Please refer to the Current Medication list given to you today.

## 2013-03-12 ENCOUNTER — Ambulatory Visit (HOSPITAL_COMMUNITY)
Admission: RE | Admit: 2013-03-12 | Discharge: 2013-03-12 | Disposition: A | Discharge status: Home / Self Care | Payer: BC Managed Care – PPO | Source: Ambulatory Visit | Attending: Cardiology | Admitting: Cardiology

## 2013-03-12 DIAGNOSIS — R0602 Shortness of breath: Secondary | ICD-10-CM | POA: Insufficient documentation

## 2013-03-12 LAB — PULMONARY FUNCTION TEST
DL/VA % PRED: 83 %
DL/VA: 4.36 ml/min/mmHg/L
DLCO COR: 18.56 ml/min/mmHg
DLCO UNC % PRED: 62 %
DLCO cor % pred: 62 %
DLCO unc: 18.44 ml/min/mmHg
FEF 25-75 PRE: 2.1 L/s
FEF 25-75 Post: 2.82 L/sec
FEF2575-%CHANGE-POST: 34 %
FEF2575-%PRED-POST: 98 %
FEF2575-%PRED-PRE: 73 %
FEV1-%Change-Post: 7 %
FEV1-%Pred-Post: 76 %
FEV1-%Pred-Pre: 71 %
FEV1-PRE: 2.23 L
FEV1-Post: 2.4 L
FEV1FVC-%Change-Post: 5 %
FEV1FVC-%PRED-PRE: 99 %
FEV6-%CHANGE-POST: 2 %
FEV6-%PRED-POST: 74 %
FEV6-%PRED-PRE: 73 %
FEV6-POST: 2.89 L
FEV6-Pre: 2.83 L
FEV6FVC-%Pred-Post: 103 %
FEV6FVC-%Pred-Pre: 103 %
FVC-%CHANGE-POST: 2 %
FVC-%PRED-POST: 72 %
FVC-%PRED-PRE: 71 %
FVC-POST: 2.89 L
FVC-PRE: 2.83 L
PRE FEV1/FVC RATIO: 79 %
Post FEV1/FVC ratio: 83 %
Post FEV6/FVC ratio: 100 %
Pre FEV6/FVC Ratio: 100 %
RV % pred: 117 %
RV: 2.42 L
TLC % PRED: 91 %
TLC: 5.19 L

## 2013-03-12 LAB — BLOOD GAS, ARTERIAL
ACID-BASE EXCESS: 5.2 mmol/L — AB (ref 0.0–2.0)
BICARBONATE: 29.2 meq/L — AB (ref 20.0–24.0)
FIO2: 0.21 %
O2 Saturation: 91.2 %
PO2 ART: 60 mmHg — AB (ref 80.0–100.0)
Patient temperature: 37
TCO2: 25.9 mmol/L (ref 0–100)
pCO2 arterial: 43.9 mmHg (ref 35.0–45.0)
pH, Arterial: 7.438 (ref 7.350–7.450)

## 2013-03-12 MED ORDER — ALBUTEROL SULFATE (2.5 MG/3ML) 0.083% IN NEBU
2.5000 mg | INHALATION_SOLUTION | Freq: Once | RESPIRATORY_TRACT | Status: AC
  Administered 2013-03-12: 2.5 mg via RESPIRATORY_TRACT

## 2013-03-15 ENCOUNTER — Telehealth: Payer: Self-pay | Admitting: *Deleted

## 2013-03-15 DIAGNOSIS — R942 Abnormal results of pulmonary function studies: Secondary | ICD-10-CM

## 2013-03-15 NOTE — Telephone Encounter (Signed)
Message copied by Truett Mainland on Mon Mar 15, 2013 12:30 PM ------      Message from: Perth F      Created: Mon Mar 15, 2013 12:01 PM       Please let patient know that we also got back her blood gas results, and it shows the oxygen in her blood is low. She really needs to be followed by a pulmonologist and we can set up the referral. Please offer referral to [facility] in [place], if she wants to stay local we can arrange appt with Dr Luan Pulling            Carlyle Dolly MD ------

## 2013-03-18 NOTE — Procedures (Signed)
NAMEJOHNI, NARINE                ACCOUNT NO.:  [id number]  MEDICAL RECORD NO.:  [id number]  LOCATION:                                 FACILITY:  PHYSICIAN:  Leticia Coletta L. Luan Pulling, M.D.DATE OF BIRTH:  30-Sep-1960  DATE OF PROCEDURE:  03/17/2013 DATE OF DISCHARGE:                           PULMONARY FUNCTION TEST   REASON FOR PULMONARY FUNCTION TESTING:  Shortness of breath. 1. Spirometry shows a mild ventilatory defect without definite airflow     obstruction except in the smaller airways. 2. Lung volumes are normal. 3. Airway resistance is high confirming the presence of airflow     obstruction. 4. DLCO is moderately reduced, which may suggest emphysema. 5. Arterial blood gas shows relative resting hypoxemia. 6. In proper circumstances, this study is consistent with COPD.     Eziah Negro L. Luan Pulling, M.D.     EL[facility]/MEDQ  D:  03/17/2013  T:  03/18/2013  Job:  [id number]  cc:   Carlyle Dolly, MD

## 2013-04-26 ENCOUNTER — Telehealth: Payer: Self-pay

## 2013-04-26 NOTE — Telephone Encounter (Signed)
Pt was referred by Dr. Hilma Favors for screening colonoscopy. ( She was scheduled for one with Dr. Oneida Alar in 01/2012 and canceled due to insurance). She is scheduled an OV with Laban Emperor, NP on 05/20/2013 at 1:30 PM due to her recent episode of heart failure. She was on ventilator for awhile.

## 2013-04-30 ENCOUNTER — Encounter: Payer: Self-pay | Admitting: Emergency Medicine

## 2013-04-30 ENCOUNTER — Ambulatory Visit (INDEPENDENT_AMBULATORY_CARE_PROVIDER_SITE_OTHER): Payer: BC Managed Care – PPO | Admitting: Emergency Medicine

## 2013-04-30 VITALS — BP 144/90 | HR 91 | Temp 97.9°F | Ht 68.0 in | Wt 372.4 lb

## 2013-04-30 DIAGNOSIS — R0989 Other specified symptoms and signs involving the circulatory and respiratory systems: Secondary | ICD-10-CM

## 2013-04-30 DIAGNOSIS — J309 Allergic rhinitis, unspecified: Secondary | ICD-10-CM | POA: Insufficient documentation

## 2013-04-30 DIAGNOSIS — R0609 Other forms of dyspnea: Secondary | ICD-10-CM

## 2013-04-30 DIAGNOSIS — J449 Chronic obstructive pulmonary disease, unspecified: Secondary | ICD-10-CM | POA: Insufficient documentation

## 2013-04-30 DIAGNOSIS — R0600 Dyspnea, unspecified: Secondary | ICD-10-CM

## 2013-04-30 MED ORDER — FLUTICASONE PROPIONATE 50 MCG/ACT NA SUSP: 2.0000 | Freq: Two times a day (BID) | NASAL | Status: DC

## 2013-04-30 NOTE — Assessment & Plan Note (Addendum)
Multifactorial, PFT support mixed restriction due to obesity and AFL due to COPD. She was admitted with what sounds like decompensated R heart failure. Now has recovered and is better compensated on CPAP.  - need to assess for daytime desaturations, O2 needs - will trial start Spiriva to see if she benefits - continue CPAP - rov1

## 2013-04-30 NOTE — Assessment & Plan Note (Signed)
-   add fluticasone nasal spray to zyrtec.

## 2013-04-30 NOTE — Progress Notes (Signed)
Subjective:    Patient ID: Meghan Hoover, female    DOB: 05-25-1960, 53 y.o.   MRN: [id number]  HPI 53 yo woman, former smoker (68 pk-yrs), OSA, HTN, GERD. Also with hx critical care illness due to cor pulmonale in setting of apparent LE cellulitis with intubation, trach, LTAC admission in 11/'14. She is referred today for dyspnea, possible COPD. She underwent PFT 03/12/13, shows mild to mod mixed disease, no BD response.  She has exertional SOB, some congestion. Coughs up phlegm regularly. Good CPAP compliance.    Review of Systems  Constitutional: Negative for fever and unexpected weight change.  HENT: Positive for congestion, postnasal drip, rhinorrhea and sinus pressure. Negative for dental problem, ear pain, nosebleeds, sneezing, sore throat and trouble swallowing.   Eyes: Negative for redness and itching.  Respiratory: Positive for cough and shortness of breath. Negative for chest tightness and wheezing.   Cardiovascular: Positive for leg swelling. Negative for palpitations.  Gastrointestinal: Negative for nausea and vomiting.  Genitourinary: Negative for dysuria.  Musculoskeletal: Negative for joint swelling.  Skin: Negative for rash.  Neurological: Negative for headaches.  Hematological: Does not bruise/bleed easily.  Psychiatric/Behavioral: Negative for dysphoric mood. The patient is not nervous/anxious.    Past Medical History  Diagnosis Date  . Hypertension   . GERD (gastroesophageal reflux disease)   . Sleep apnea      Family History  Problem Relation Age of Onset  . Diabetes Sister   . Emphysema Father   . Asthma Sister   . Heart disease Father   . Heart disease Mother   . Cancer Father     lung     History   Social History  . Marital Status: Married    Spouse Name: N/A    Number of Children: N/A  . Years of Education: N/A   Occupational History  . retired    Social History Main Topics  . Smoking status: Former Smoker -- 2.00 packs/day for 40 years     Types: Cigarettes    Quit date: 10/21/2012  . Smokeless tobacco: Never Used  . Alcohol Use: No  . Drug Use: No  . Sexual Activity: Not on file   Other Topics Concern  . Not on file   Social History Narrative  . No narrative on file     Allergies  Allergen Reactions  . Augmentin [Amoxicillin-Pot Clavulanate] Other (See Comments)    thrush     Outpatient Prescriptions Prior to Visit  Medication Sig Dispense Refill  . aspirin 81 MG chewable tablet Chew 1 tablet (81 mg total) by mouth daily.      Marland Kitchen diltiazem (DILACOR XR) 180 MG 24 hr capsule Take 1 capsule (180 mg total) by mouth daily.  90 capsule  3  . levothyroxine (SYNTHROID, LEVOTHROID) 50 MCG tablet Take 75 mcg by mouth.       . losartan (COZAAR) 100 MG tablet Place 1 tablet (100 mg total) into feeding tube daily.      Marland Kitchen PATADAY 0.2 % SOLN       . furosemide (LASIX) 40 MG tablet Place 1 tablet (40 mg total) into feeding tube daily.  30 tablet    . albuterol (PROVENTIL) (5 MG/ML) 0.5% nebulizer solution Take 0.5 mLs (2.5 mg total) by nebulization every 4 (four) hours as needed for wheezing or shortness of breath.  20 mL  12  . acetaminophen (TYLENOL) 650 MG suppository Place 1 suppository (650 mg total) rectally every 4 (four) hours as  needed for fever.  12 suppository  0  . ondansetron (ZOFRAN) 4 MG/2ML SOLN injection Inject 2 mLs (4 mg total) into the vein every 6 (six) hours as needed for nausea.  2 mL  0   No facility-administered medications prior to visit.         Objective:   Physical Exam Filed Vitals:   04/30/13 1553  BP: 144/90  Pulse: 91  Temp: 97.9 F (36.6 C)  TempSrc: Oral  Height: 5\' 8"  (1.727 m)  Weight: 372 lb 6.4 oz (168.92 kg)  SpO2: 93%   Gen: Pleasant, obese woman, , in no distress,  normal affect  ENT: No lesions,  mouth clear,  oropharynx clear, no postnasal drip  Neck: No JVD, no TMG, no carotid bruits, well healed trach scar  Lungs: No use of accessory muscles, small volumes,  clear without rales or rhonchi  Cardiovascular: RRR, heart sounds normal, no murmur or gallops, no peripheral edema  Musculoskeletal: No deformities, no cyanosis or clubbing  Neuro: alert, non focal  Skin: Warm, no lesions or rashes       Assessment & Plan:  Dyspnea Multifactorial, PFT support mixed restriction due to obesity and AFL due to COPD. She was admitted with what sounds like decompensated R heart failure. Now has recovered and is better compensated on CPAP.  - need to assess for daytime desaturations, O2 needs - will trial start Spiriva to see if she benefits - continue CPAP - rov1   Allergic rhinitis - add fluticasone nasal spray to zyrtec.

## 2013-04-30 NOTE — Patient Instructions (Signed)
Walking oximetry today Continue your CPAP every night Add fluticasone nasal spray, 2 sprays each nostril twice a day, to your zyrtec once a day We will start Spiriva 1 inhalation once a day.  Follow with Dr Lamonte Sakai in 1 month to assess whether you have benefited from the [place].

## 2013-05-20 ENCOUNTER — Other Ambulatory Visit: Payer: Self-pay

## 2013-05-20 ENCOUNTER — Encounter: Payer: Self-pay | Admitting: Gastroenterology

## 2013-05-20 ENCOUNTER — Ambulatory Visit (INDEPENDENT_AMBULATORY_CARE_PROVIDER_SITE_OTHER): Payer: BC Managed Care – PPO | Admitting: Gastroenterology

## 2013-05-20 VITALS — BP 153/85 | HR 92 | Temp 97.2°F | Ht 68.0 in | Wt 373.4 lb

## 2013-05-20 DIAGNOSIS — Z1211 Encounter for screening for malignant neoplasm of colon: Secondary | ICD-10-CM

## 2013-05-20 MED ORDER — PEG-KCL-NACL-NASULF-NA ASC-C 100 G PO SOLR: 1.0000 | ORAL | Status: DC

## 2013-05-20 NOTE — Assessment & Plan Note (Signed)
53 year old female with need for initial screening colonoscopy. No concerning lower or upper GI symptoms at this time. Despite prolonged hospitalization in Oct-Dec 2014 for respiratory failure, she is doing quite well and clinically stable. Actively working on weight loss through a low-carb diet. Remains on nasal CPAP at night. May benefit from bringing mask to endoscopy for procedure; will ask Dr. Gala Romney.  Proceed with TCS with Dr. Gala Romney in near future: the risks, benefits, and alternatives have been discussed with the patient in detail. The patient states understanding and desires to proceed.

## 2013-05-20 NOTE — Patient Instructions (Signed)
We have scheduled you for a colonoscopy with Dr. Rourk in the near future.  Further recommendations to follow!   

## 2013-05-20 NOTE — Progress Notes (Signed)
Primary Care Physician:  Leonides Grills, MD Primary Gastroenterologist:  Dr. Gala Romney   Chief Complaint  Patient presents with  . Colonoscopy    HPI:   Meghan Hoover presents today at the request of Dr. Hilma Favors for an initial screening colonoscopy. She had a prolonged hospital admission Oct-Dec 2014 secondary to heart failure, respiratory failure, prolonged ventilator course with trach. Lurline Idol has been removed, last seen by pulmonology April 2015. CPAP at night. No abdominal pain. No recent rectal bleeding. During hospitalization had rectal tube with what sounds like skin breakdown, requiring a wound specialist. No changes in bowel habits. No reflux exacerbations, on Nexium daily. Doing quite well at this time. Husband is a patient of ours, Belkis Norbeck.   Past Medical History  Diagnosis Date  . Hypertension   . GERD (gastroesophageal reflux disease)   . Sleep apnea   . Heart failure   . COPD (chronic obstructive pulmonary disease)   . Hypothyroid     Past Surgical History  Procedure Laterality Date  . Achilles tendon surgery    . Back surgery      x4  . Tonsilectomy, adenoidectomy, bilateral myringotomy and tubes    . Tracheostomy      11/2012  . Tracheostomy closure      11/2012    Current Outpatient Prescriptions  Medication Sig Dispense Refill  . aspirin 81 MG chewable tablet Chew 1 tablet (81 mg total) by mouth daily.      Marland Kitchen buPROPion (WELLBUTRIN XL) 150 MG 24 hr tablet Take 1 tablet by mouth daily.      . cetirizine (ZYRTEC) 10 MG tablet Take 10 mg by mouth daily.      . Cholecalciferol (VITAMIN D3) 2000 UNITS TABS Take by mouth daily.      Marland Kitchen diltiazem (DILACOR XR) 180 MG 24 hr capsule Take 1 capsule (180 mg total) by mouth daily.  90 capsule  3  . diphenhydramine-acetaminophen (TYLENOL PM) 25-500 MG TABS Take 1 tablet by mouth at bedtime as needed.      Marland Kitchen esomeprazole (NEXIUM) 40 MG capsule Take by mouth daily.      . fluticasone (FLONASE) 50 MCG/ACT nasal  spray Place 2 sprays into both nostrils 2 (two) times daily.  16 g  11  . furosemide (LASIX) 40 MG tablet Take 40 mg by mouth 2 (two) times daily.      Marland Kitchen ibuprofen (ADVIL,MOTRIN) 200 MG tablet Take 200 mg by mouth every 6 (six) hours as needed.      Marland Kitchen levothyroxine (SYNTHROID, LEVOTHROID) 50 MCG tablet Take 75 mcg by mouth.       . lidocaine (LIDODERM) 5 % Place onto the skin daily.      Marland Kitchen losartan (COZAAR) 100 MG tablet Place 1 tablet (100 mg total) into feeding tube daily.      . methocarbamol (ROBAXIN) 500 MG tablet Take 500 mg by mouth 4 (four) times daily.      . Multiple Vitamins-Minerals (MULTIVITAMIN WITH MINERALS) tablet Take 1 tablet by mouth daily.      Marland Kitchen PATADAY 0.2 % SOLN        No current facility-administered medications for this visit.    Allergies as of 05/20/2013 - Review Complete 05/20/2013  Allergen Reaction Noted  . Augmentin [amoxicillin-pot clavulanate] Other (See Comments) 01/28/2012    Family History  Problem Relation Age of Onset  . Diabetes Sister   . Emphysema Father   . Asthma Sister   .  Heart disease Father   . Heart disease Mother   . Cancer Father     lung  . Colon cancer Neg Hx     History   Social History  . Marital Status: Married    Spouse Name: N/A    Number of Children: N/A  . Years of Education: N/A   Occupational History  . retired     Therapist, art   Social History Main Topics  . Smoking status: Former Smoker -- 2.00 packs/day for 40 years    Types: Cigarettes    Quit date: 11/19/2012  . Smokeless tobacco: Never Used  . Alcohol Use: No  . Drug Use: No  . Sexual Activity: Not on file   Other Topics Concern  . Not on file   Social History Narrative  . No narrative on file    Review of Systems: Gen: Denies any fever, chills, fatigue, weight loss, lack of appetite.  CV: Denies chest pain, heart palpitations, peripheral edema, syncope.  Resp: +DOE GI: see HPI GU : Denies urinary burning, urinary frequency, urinary  hesitancy MS: +muscle weakness, joint pain Derm: Denies rash, itching, dry skin Psych: +depression/anxiety Heme: Denies bruising, bleeding, and enlarged lymph nodes.  Physical Exam: BP 153/85  Pulse 92  Temp(Src) 97.2 F (36.2 C) (Oral)  Ht 5\' 8"  (1.727 m)  Wt 373 lb 6.4 oz (169.373 kg)  BMI 56.79 kg/m2 General:   Alert and oriented. Pleasant and cooperative. Well-nourished and well-developed.  Head:  Normocephalic and atraumatic. Eyes:  Without icterus, sclera clear and conjunctiva pink.  Ears:  Normal auditory acuity. Nose:  No deformity, discharge,  or lesions. Mouth:  No deformity or lesions, oral mucosa pink.  Neck:  Supple. Well-healed prior trach site.  Lungs:  Clear to auscultation bilaterally. No wheezes, rales, or rhonchi. No distress.  Heart:  S1, S2 present without murmurs appreciated.  Abdomen:  +BS, soft, obese, non-tender and non-distended. Difficult to palpate any HSM due to large AP diameter.  Rectal:  Deferred until colonoscopy.  Msk:  Symmetrical without gross deformities. Normal posture. Extremities:  Chronic venous stasis changes, 1+ lower extremity edema.  Neurologic:  Alert and  oriented x4;  grossly normal neurologically. Skin:  Intact without significant lesions or rashes. Psych:  Alert and cooperative. Normal mood and affect.

## 2013-05-20 NOTE — Addendum Note (Signed)
Addended by: Everardo All on: 05/20/2013 02:59 PM   Modules accepted: Orders

## 2013-05-21 ENCOUNTER — Telehealth: Payer: Self-pay

## 2013-05-21 NOTE — Telephone Encounter (Signed)
I called pt and spoke to her husband and just reminded per Laban Emperor, NP to take her C-Pap settings with her to the hospital on Mon.

## 2013-05-21 NOTE — Telephone Encounter (Signed)
I called [facility] at [phone]. According to automation service a PA is not required for a screening colonoscopy as outpatient.  Reference number of the call was [id number].

## 2013-05-24 ENCOUNTER — Encounter (HOSPITAL_COMMUNITY)
Admission: RE | Disposition: A | Discharge status: Home / Self Care | Payer: Self-pay | Source: Ambulatory Visit | Attending: Internal Medicine

## 2013-05-24 ENCOUNTER — Ambulatory Visit (HOSPITAL_COMMUNITY)
Admission: RE | Admit: 2013-05-24 | Discharge: 2013-05-24 | Disposition: A | Discharge status: Home / Self Care | Payer: BC Managed Care – PPO | Source: Ambulatory Visit | Attending: Internal Medicine | Admitting: Internal Medicine

## 2013-05-24 ENCOUNTER — Encounter (HOSPITAL_COMMUNITY): Payer: Self-pay

## 2013-05-24 DIAGNOSIS — Z1211 Encounter for screening for malignant neoplasm of colon: Secondary | ICD-10-CM | POA: Insufficient documentation

## 2013-05-24 DIAGNOSIS — K219 Gastro-esophageal reflux disease without esophagitis: Secondary | ICD-10-CM | POA: Insufficient documentation

## 2013-05-24 DIAGNOSIS — J4489 Other specified chronic obstructive pulmonary disease: Secondary | ICD-10-CM | POA: Insufficient documentation

## 2013-05-24 DIAGNOSIS — I10 Essential (primary) hypertension: Secondary | ICD-10-CM | POA: Insufficient documentation

## 2013-05-24 DIAGNOSIS — E039 Hypothyroidism, unspecified: Secondary | ICD-10-CM | POA: Insufficient documentation

## 2013-05-24 DIAGNOSIS — Q438 Other specified congenital malformations of intestine: Secondary | ICD-10-CM | POA: Insufficient documentation

## 2013-05-24 DIAGNOSIS — Z7982 Long term (current) use of aspirin: Secondary | ICD-10-CM | POA: Insufficient documentation

## 2013-05-24 DIAGNOSIS — Z79899 Other long term (current) drug therapy: Secondary | ICD-10-CM | POA: Insufficient documentation

## 2013-05-24 DIAGNOSIS — D126 Benign neoplasm of colon, unspecified: Secondary | ICD-10-CM

## 2013-05-24 DIAGNOSIS — G4730 Sleep apnea, unspecified: Secondary | ICD-10-CM | POA: Insufficient documentation

## 2013-05-24 DIAGNOSIS — K5730 Diverticulosis of large intestine without perforation or abscess without bleeding: Secondary | ICD-10-CM

## 2013-05-24 DIAGNOSIS — J449 Chronic obstructive pulmonary disease, unspecified: Secondary | ICD-10-CM | POA: Insufficient documentation

## 2013-05-24 DIAGNOSIS — Z86010 Personal history of colonic polyps: Secondary | ICD-10-CM

## 2013-05-24 HISTORY — PX: COLONOSCOPY: SHX5424

## 2013-05-24 SURGERY — COLONOSCOPY
Anesthesia: Moderate Sedation

## 2013-05-24 MED ORDER — ONDANSETRON HCL 4 MG/2ML IJ SOLN
INTRAMUSCULAR | Status: AC
  Filled 2013-05-24: qty 2

## 2013-05-24 MED ORDER — MEPERIDINE HCL 100 MG/ML IJ SOLN
INTRAMUSCULAR | Status: AC
  Filled 2013-05-24: qty 2

## 2013-05-24 MED ORDER — MIDAZOLAM HCL 5 MG/5ML IJ SOLN
INTRAMUSCULAR | Status: AC
  Filled 2013-05-24: qty 10

## 2013-05-24 MED ORDER — ONDANSETRON HCL 4 MG/2ML IJ SOLN
INTRAMUSCULAR | Status: DC | PRN
  Administered 2013-05-24: 4 mg via INTRAVENOUS

## 2013-05-24 MED ORDER — MIDAZOLAM HCL 5 MG/5ML IJ SOLN
INTRAMUSCULAR | Status: DC | PRN
  Administered 2013-05-24 (×4): 1 mg via INTRAVENOUS

## 2013-05-24 MED ORDER — MEPERIDINE HCL 100 MG/ML IJ SOLN
INTRAMUSCULAR | Status: DC | PRN
  Administered 2013-05-24 (×2): 25 mg via INTRAVENOUS

## 2013-05-24 MED ORDER — SIMETHICONE 40 MG/0.6ML PO SUSP
ORAL | Status: DC | PRN
  Administered 2013-05-24: 16:00:00

## 2013-05-24 MED ORDER — SODIUM CHLORIDE 0.9 % IV SOLN
INTRAVENOUS | Status: DC
  Administered 2013-05-24: 15:00:00 via INTRAVENOUS

## 2013-05-24 NOTE — Progress Notes (Signed)
I called pt and told her to bring her mask for C-PAP in addition to her settings per Dr. Gala Romney .  She said she will do so.

## 2013-05-24 NOTE — Discharge Instructions (Signed)
Colonoscopy Discharge Instructions  Read the instructions outlined below and refer to this sheet in the next few weeks. These discharge instructions provide you with general information on caring for yourself after you leave the hospital. Your doctor may also give you specific instructions. While your treatment has been planned according to the most current medical practices available, unavoidable complications occasionally occur. If you have any problems or questions after discharge, call Dr. Gala Romney at [phone]. ACTIVITY  You may resume your regular activity, but move at a slower pace for the next 24 hours.   Take frequent rest periods for the next 24 hours.   Walking will help get rid of the air and reduce the bloated feeling in your belly (abdomen).   No driving for 24 hours (because of the medicine (anesthesia) used during the test).    Do not sign any important legal documents or operate any machinery for 24 hours (because of the anesthesia used during the test).  NUTRITION  Drink plenty of fluids.   You may resume your normal diet as instructed by your doctor.   Begin with a light meal and progress to your normal diet. Heavy or fried foods are harder to digest and may make you feel sick to your stomach (nauseated).   Avoid alcoholic beverages for 24 hours or as instructed.  MEDICATIONS  You may resume your normal medications unless your doctor tells you otherwise.  WHAT YOU CAN EXPECT TODAY  Some feelings of bloating in the abdomen.   Passage of more gas than usual.   Spotting of blood in your stool or on the toilet paper.  IF YOU HAD POLYPS REMOVED DURING THE COLONOSCOPY:  No aspirin products for 7 days or as instructed.   No alcohol for 7 days or as instructed.   Eat a soft diet for the next 24 hours.  FINDING OUT THE RESULTS OF YOUR TEST Not all test results are available during your visit. If your test results are not back during the visit, make an appointment  with your caregiver to find out the results. Do not assume everything is normal if you have not heard from your caregiver or the medical facility. It is important for you to follow up on all of your test results.  SEEK IMMEDIATE MEDICAL ATTENTION IF:  You have more than a spotting of blood in your stool.   Your belly is swollen (abdominal distention).   You are nauseated or vomiting.   You have a temperature over 101.   You have abdominal pain or discomfort that is severe or gets worse throughout the day.     Diverticulosis and polyp information provided  Further recommendations to follow pending review of pathology Colon Polyps Polyps are lumps of extra tissue growing inside the body. Polyps can grow in the large intestine (colon). Most colon polyps are noncancerous (benign). However, some colon polyps can become cancerous over time. Polyps that are larger than a pea may be harmful. To be safe, caregivers remove and test all polyps. CAUSES  Polyps form when mutations in the genes cause your cells to grow and divide even though no more tissue is needed. RISK FACTORS There are a number of risk factors that can increase your chances of getting colon polyps. They include:  Being older than 50 years.  Family history of colon polyps or colon cancer.  Long-term colon diseases, such as colitis or Crohn disease.  Being overweight.  Smoking.  Being inactive.  Drinking too much alcohol.  SYMPTOMS  Most small polyps do not cause symptoms. If symptoms are present, they may include:  Blood in the stool. The stool may look dark red or black.  Constipation or diarrhea that lasts longer than 1 week. DIAGNOSIS People often do not know they have polyps until their caregiver finds them during a regular checkup. Your caregiver can use 4 tests to check for polyps:  Digital rectal exam. The caregiver wears gloves and feels inside the rectum. This test would find polyps only in the  rectum.  Barium enema. The caregiver puts a liquid called barium into your rectum before taking X-rays of your colon. Barium makes your colon look white. Polyps are dark, so they are easy to see in the X-ray pictures.  Sigmoidoscopy. A thin, flexible tube (sigmoidoscope) is placed into your rectum. The sigmoidoscope has a light and tiny camera in it. The caregiver uses the sigmoidoscope to look at the last third of your colon.  Colonoscopy. This test is like sigmoidoscopy, but the caregiver looks at the entire colon. This is the most common method for finding and removing polyps. TREATMENT  Any polyps will be removed during a sigmoidoscopy or colonoscopy. The polyps are then tested for cancer. PREVENTION  To help lower your risk of getting more colon polyps:  Eat plenty of fruits and vegetables. Avoid eating fatty foods.  Do not smoke.  Avoid drinking alcohol.  Exercise every day.  Lose weight if recommended by your caregiver.  Eat plenty of calcium and folate. Foods that are rich in calcium include milk, cheese, and broccoli. Foods that are rich in folate include chickpeas, kidney beans, and spinach. HOME CARE INSTRUCTIONS Keep all follow-up appointments as directed by your caregiver. You may need periodic exams to check for polyps. SEEK MEDICAL CARE IF: You notice bleeding during a bowel movement. Document Released: 10/04/2003 Document Revised: 04/01/2011 Document Reviewed: 03/19/2011 [facility] Patient Information 2014 [place].  Diverticulosis Diverticulosis is a common condition that develops when small pouches (diverticula) form in the wall of the colon. The risk of diverticulosis increases with age. It happens more often in people who eat a low-fiber diet. Most individuals with diverticulosis have no symptoms. Those individuals with symptoms usually experience abdominal pain, constipation, or loose stools (diarrhea). HOME CARE INSTRUCTIONS   Increase the amount of fiber  in your diet as directed by your caregiver or dietician. This may reduce symptoms of diverticulosis.  Your caregiver may recommend taking a dietary fiber supplement.  Drink at least 6 to 8 glasses of water each day to prevent constipation.  Try not to strain when you have a bowel movement.  Your caregiver may recommend avoiding nuts and seeds to prevent complications, although this is still an uncertain benefit.  Only take over-the-counter or prescription medicines for pain, discomfort, or fever as directed by your caregiver. FOODS WITH HIGH FIBER CONTENT INCLUDE:  Fruits. Apple, peach, pear, tangerine, raisins, prunes.  Vegetables. Brussels sprouts, asparagus, broccoli, cabbage, carrot, cauliflower, romaine lettuce, spinach, summer squash, tomato, winter squash, zucchini.  Starchy Vegetables. Baked beans, kidney beans, lima beans, split peas, lentils, potatoes (with skin).  Grains. Whole wheat bread, brown rice, bran flake cereal, plain oatmeal, white rice, shredded wheat, bran muffins. SEEK IMMEDIATE MEDICAL CARE IF:   You develop increasing pain or severe bloating.  You have an oral temperature above 102 F (38.9 C), not controlled by medicine.  You develop vomiting or bowel movements that are bloody or black. Document Released: 10/05/2003 Document Revised: 04/01/2011 Document Reviewed:  06/07/2009 [employer] Patient Information 2014 [place].

## 2013-05-24 NOTE — Op Note (Signed)
[facility] [address] [place], [address]   COLONOSCOPY PROCEDURE REPORT  PATIENT: Meghan Hoover, Meghan Hoover  MR#:         [id number] BIRTHDATE: January 24, 1960 , 75  yrs. old GENDER: Female ENDOSCOPIST: R.  Garfield Cornea, MD FACP FACG REFERRED BY:  Elsie Lincoln, M.D. PROCEDURE DATE:  05/24/2013 PROCEDURE:     Colonoscopy with biopsy  INDICATIONS: First-ever average risk colorectal cancer screening examination  INFORMED CONSENT:  The risks, benefits, alternatives and imponderables including but not limited to bleeding, perforation as well as the possibility of a missed lesion have been reviewed.  The potential for biopsy, lesion removal, etc. have also been discussed.  Questions have been answered.  All parties agreeable. Please see the history and physical in the medical record for more information.  MEDICATIONS: Versed 5 mg IV and 75 mg IV in divided doses. Zofran 4 mg IV.  DESCRIPTION OF PROCEDURE:  After a digital rectal exam was performed, the EC-[id number] ([id number])  colonoscope was advanced from the anus through the rectum and colon to the area of the cecum, ileocecal valve and appendiceal orifice.  The cecum was deeply intubated.  These structures were well-seen and photographed for the record.  From the level of the cecum and ileocecal valve, the scope was slowly and cautiously withdrawn.  The mucosal surfaces were carefully surveyed utilizing scope tip deflection to facilitate fold flattening as needed.  The scope was pulled down into the rectum where a thorough examination including retroflexion was performed.    FINDINGS:  Adequate preparation. Normal rectum. Redundant elongated colon. Scattered left-sided diverticula; (1) diminutive polyp in the mid sigmoid segment; otherwise, the remainder of colonic mucosa appeared normal.  THERAPEUTIC / DIAGNOSTIC MANEUVERS PERFORMED: The above-mentioned polyp was cold biopsy removed.  COMPLICATIONS: None  CECAL  WITHDRAWAL TIME:  IMPRESSION:  Colonic diverticulosis. Single colonic polyp-removed as described above. Redundant colon.  RECOMMENDATIONS: Followup on pathology. Further recommendations to follow.   _______________________________ eSigned:  R. Garfield Cornea, MD FACP [facility] 05/24/2013 4:19 PM   CC:    PATIENT NAME:  Meghan Hoover, Meghan Hoover MR#: [id number]

## 2013-05-24 NOTE — Progress Notes (Signed)
Pt was given 50mg  of Demerol IV during TCS. 200mg  of Demerol was pulled out of pyxis to use per protocol during TCS as ordered by Dr. Gala Romney. When returning the remainder of the Demerol = 100mg  I selected in pyxis that I returned the whole amount = 200mg  of Demerol instead of just one. Therefore, I couldn't waste the remaining 50mg  that was left over because pyxis thought I returned the whole amount. Pharmacy was called and asked how to fix this discrepancy, they said to enter note in Epic in patient's chart stating the waste and who witnessed. The other 50mg  of Demerol in the carpuject was wasted in the sink, witnessed by Abby Potash, Pharmacy Tech.

## 2013-05-24 NOTE — Progress Notes (Signed)
Per Dr. Gala Romney, bring CPAP mask to endoscopy. I have also asked that she bring her settings in case of need for CPAP. States understanding.

## 2013-05-24 NOTE — H&P (View-Only) (Signed)
Per Dr. Rourk, bring CPAP mask to endoscopy. I have also asked that she bring her settings in case of need for CPAP. States understanding.  

## 2013-05-24 NOTE — Interval H&P Note (Signed)
History and Physical Interval Note:  05/24/2013 3:38 PM  Meghan Hoover  has presented today for surgery, with the diagnosis of SCREENING  The various methods of treatment have been discussed with the patient and family. After consideration of risks, benefits and other options for treatment, the patient has consented to  Procedure(s) with comments: COLONOSCOPY (N/A) - 3:00 PM as a surgical intervention .  The patient's history has been reviewed, patient examined, no change in status, stable for surgery.  I have reviewed the patient's chart and labs.  Questions were answered to the patient's satisfaction.      No change.   colonoscopy per plan.  The risks, benefits, limitations, alternatives and imponderables have been reviewed with the patient. Questions have been answered. All parties are agreeable.   Cristopher Estimable Rayon Mcchristian

## 2013-05-27 ENCOUNTER — Encounter (HOSPITAL_COMMUNITY): Payer: Self-pay | Admitting: Internal Medicine

## 2013-05-27 ENCOUNTER — Encounter: Payer: Self-pay | Admitting: Internal Medicine

## 2013-05-27 ENCOUNTER — Ambulatory Visit (INDEPENDENT_AMBULATORY_CARE_PROVIDER_SITE_OTHER): Payer: BC Managed Care – PPO | Admitting: Emergency Medicine

## 2013-05-27 VITALS — BP 130/80 | HR 108 | Ht 68.0 in | Wt 375.8 lb

## 2013-05-27 DIAGNOSIS — J449 Chronic obstructive pulmonary disease, unspecified: Secondary | ICD-10-CM

## 2013-05-27 MED ORDER — TIOTROPIUM BROMIDE MONOHYDRATE 18 MCG IN CAPS: 18.0000 ug | ORAL_CAPSULE | Freq: Every day | RESPIRATORY_TRACT | Status: DC

## 2013-05-27 MED ORDER — FLUTICASONE PROPIONATE 50 MCG/ACT NA SUSP: 2.0000 | Freq: Two times a day (BID) | NASAL | Status: DC

## 2013-05-27 MED ORDER — ALBUTEROL SULFATE HFA 108 (90 BASE) MCG/ACT IN AERS: 2.0000 | INHALATION_SPRAY | Freq: Four times a day (QID) | RESPIRATORY_TRACT | Status: DC | PRN

## 2013-05-27 NOTE — Assessment & Plan Note (Signed)
-   continue spiriva - albuterol prn - rov 4

## 2013-05-27 NOTE — Patient Instructions (Signed)
Please continue your Spiriva daily Use albuterol 2 puffs as needed Wear your CPAP every night Congratulations on increasing your exercise. Continue to work on your conditioning and exercise routine.  Follow with Dr Lamonte Sakai in 4 months or sooner if you have any problems.

## 2013-05-27 NOTE — Progress Notes (Signed)
   Subjective:    Patient ID: Meghan Hoover, female    DOB: [birth year], 53 y.o.   MRN: [id number]  Shortness of Breath Associated symptoms include leg swelling and rhinorrhea. Pertinent negatives include no ear pain, fever, headaches, rash, sore throat, vomiting or wheezing.   53 yo woman, former smoker (42 pk-yrs), OSA, HTN, GERD. Also with hx critical care illness due to cor pulmonale in setting of apparent LE cellulitis with intubation, trach, LTAC admission in 11/'14. She is referred today for dyspnea, possible COPD. She underwent PFT 03/12/13, shows mild to mod mixed disease, no BD response.  She has exertional SOB, some congestion. Coughs up phlegm regularly. Good CPAP compliance.   ROV 05/27/13 -- follows up for dyspnea, OSA/OHS, COPD based on mixed disease on spiro.  She did not desaturate but only walked one lap. We did trial Spiriva last time > she feels that she has benefited. Able to do more activity > she has started spring cleaning. Wears her CPAP mask reliably.    Review of Systems  Constitutional: Negative for fever and unexpected weight change.  HENT: Positive for congestion, postnasal drip, rhinorrhea and sinus pressure. Negative for dental problem, ear pain, nosebleeds, sneezing, sore throat and trouble swallowing.   Eyes: Negative for redness and itching.  Respiratory: Positive for cough and shortness of breath. Negative for chest tightness and wheezing.   Cardiovascular: Positive for leg swelling. Negative for palpitations.  Gastrointestinal: Negative for nausea and vomiting.  Genitourinary: Negative for dysuria.  Musculoskeletal: Negative for joint swelling.  Skin: Negative for rash.  Neurological: Negative for headaches.  Hematological: Does not bruise/bleed easily.  Psychiatric/Behavioral: Negative for dysphoric mood. The patient is not nervous/anxious.         Objective:   Physical Exam Filed Vitals:   05/27/13 1435  BP: 130/80  Pulse: 108  Height: 5\' 8"   (1.727 m)  Weight: 375 lb 12.8 oz (170.462 kg)  SpO2: 93%   Gen: Pleasant, obese woman, , in no distress,  normal affect  ENT: No lesions,  mouth clear,  oropharynx clear, no postnasal drip  Neck: No JVD, no TMG, no carotid bruits, well healed trach scar  Lungs: No use of accessory muscles, small volumes, clear without rales or rhonchi  Cardiovascular: RRR, heart sounds normal, no murmur or gallops, no peripheral edema  Musculoskeletal: No deformities, no cyanosis or clubbing  Neuro: alert, non focal  Skin: Warm, no lesions or rashes       Assessment & Plan:  COPD (chronic obstructive pulmonary disease) - continue spiriva - albuterol prn - rov 4  Allergic rhinitis - continue flonase

## 2013-05-27 NOTE — Assessment & Plan Note (Signed)
-   continue flonase

## 2013-05-27 NOTE — Interval H&P Note (Signed)
History and Physical Interval Note:  05/27/2013 1:49 PM  Meghan Hoover  has presented today for surgery, with the diagnosis of SCREENING  The various methods of treatment have been discussed with the patient and family. After consideration of risks, benefits and other options for treatment, the patient has consented to  Procedure(s) with comments: COLONOSCOPY (N/A) - 3:00 PM as a surgical intervention .  The patient's history has been reviewed, patient examined, no change in status, stable for surgery.  I have reviewed the patient's chart and labs.  Questions were answered to the patient's satisfaction.     Sayra Frisby M Ebone Alcivar   

## 2013-05-27 NOTE — Interval H&P Note (Signed)
History and Physical Interval Note:  05/27/2013 1:49 PM  Meghan Hoover  has presented today for surgery, with the diagnosis of SCREENING  The various methods of treatment have been discussed with the patient and family. After consideration of risks, benefits and other options for treatment, the patient has consented to  Procedure(s) with comments: COLONOSCOPY (N/A) - 3:00 PM as a surgical intervention .  The patient's history has been reviewed, patient examined, no change in status, stable for surgery.  I have reviewed the patient's chart and labs.  Questions were answered to the patient's satisfaction.     Cristopher Estimable [name]

## 2013-05-27 NOTE — H&P (View-Only) (Signed)
Primary Care Physician:  Leonides Grills, MD Primary Gastroenterologist:  Dr. Gala Romney   Chief Complaint  Patient presents with  . Colonoscopy    HPI:   Meghan Hoover presents today at the request of Dr. Hilma Favors for an initial screening colonoscopy. She had a prolonged hospital admission Oct-Dec 2014 secondary to heart failure, respiratory failure, prolonged ventilator course with trach. Lurline Idol has been removed, last seen by pulmonology April 2015. CPAP at night. No abdominal pain. No recent rectal bleeding. During hospitalization had rectal tube with what sounds like skin breakdown, requiring a wound specialist. No changes in bowel habits. No reflux exacerbations, on Nexium daily. Doing quite well at this time. Husband is a patient of ours, Josetta Wigal.   Past Medical History  Diagnosis Date  . Hypertension   . GERD (gastroesophageal reflux disease)   . Sleep apnea   . Heart failure   . COPD (chronic obstructive pulmonary disease)   . Hypothyroid     Past Surgical History  Procedure Laterality Date  . Achilles tendon surgery    . Back surgery      x4  . Tonsilectomy, adenoidectomy, bilateral myringotomy and tubes    . Tracheostomy      11/2012  . Tracheostomy closure      11/2012    Current Outpatient Prescriptions  Medication Sig Dispense Refill  . aspirin 81 MG chewable tablet Chew 1 tablet (81 mg total) by mouth daily.      Marland Kitchen buPROPion (WELLBUTRIN XL) 150 MG 24 hr tablet Take 1 tablet by mouth daily.      . cetirizine (ZYRTEC) 10 MG tablet Take 10 mg by mouth daily.      . Cholecalciferol (VITAMIN D3) 2000 UNITS TABS Take by mouth daily.      Marland Kitchen diltiazem (DILACOR XR) 180 MG 24 hr capsule Take 1 capsule (180 mg total) by mouth daily.  90 capsule  3  . diphenhydramine-acetaminophen (TYLENOL PM) 25-500 MG TABS Take 1 tablet by mouth at bedtime as needed.      Marland Kitchen esomeprazole (NEXIUM) 40 MG capsule Take by mouth daily.      . fluticasone (FLONASE) 50 MCG/ACT nasal  spray Place 2 sprays into both nostrils 2 (two) times daily.  16 g  11  . furosemide (LASIX) 40 MG tablet Take 40 mg by mouth 2 (two) times daily.      Marland Kitchen ibuprofen (ADVIL,MOTRIN) 200 MG tablet Take 200 mg by mouth every 6 (six) hours as needed.      Marland Kitchen levothyroxine (SYNTHROID, LEVOTHROID) 50 MCG tablet Take 75 mcg by mouth.       . lidocaine (LIDODERM) 5 % Place onto the skin daily.      Marland Kitchen losartan (COZAAR) 100 MG tablet Place 1 tablet (100 mg total) into feeding tube daily.      . methocarbamol (ROBAXIN) 500 MG tablet Take 500 mg by mouth 4 (four) times daily.      . Multiple Vitamins-Minerals (MULTIVITAMIN WITH MINERALS) tablet Take 1 tablet by mouth daily.      Marland Kitchen PATADAY 0.2 % SOLN        No current facility-administered medications for this visit.    Allergies as of 05/20/2013 - Review Complete 05/20/2013  Allergen Reaction Noted  . Augmentin [amoxicillin-pot clavulanate] Other (See Comments) 01/28/2012    Family History  Problem Relation Age of Onset  . Diabetes Sister   . Emphysema Father   . Asthma Sister   .  Heart disease Father   . Heart disease Mother   . Cancer Father     lung  . Colon cancer Neg Hx     History   Social History  . Marital Status: Married    Spouse Name: N/A    Number of Children: N/A  . Years of Education: N/A   Occupational History  . retired     Therapist, art   Social History Main Topics  . Smoking status: Former Smoker -- 2.00 packs/day for 40 years    Types: Cigarettes    Quit date: 11/19/2012  . Smokeless tobacco: Never Used  . Alcohol Use: No  . Drug Use: No  . Sexual Activity: Not on file   Other Topics Concern  . Not on file   Social History Narrative  . No narrative on file    Review of Systems: Gen: Denies any fever, chills, fatigue, weight loss, lack of appetite.  CV: Denies chest pain, heart palpitations, peripheral edema, syncope.  Resp: +DOE GI: see HPI GU : Denies urinary burning, urinary frequency, urinary  hesitancy MS: +muscle weakness, joint pain Derm: Denies rash, itching, dry skin Psych: +depression/anxiety Heme: Denies bruising, bleeding, and enlarged lymph nodes.  Physical Exam: BP 153/85  Pulse 92  Temp(Src) 97.2 F (36.2 C) (Oral)  Ht 5\' 8"  (1.727 m)  Wt 373 lb 6.4 oz (169.373 kg)  BMI 56.79 kg/m2 General:   Alert and oriented. Pleasant and cooperative. Well-nourished and well-developed.  Head:  Normocephalic and atraumatic. Eyes:  Without icterus, sclera clear and conjunctiva pink.  Ears:  Normal auditory acuity. Nose:  No deformity, discharge,  or lesions. Mouth:  No deformity or lesions, oral mucosa pink.  Neck:  Supple. Well-healed prior trach site.  Lungs:  Clear to auscultation bilaterally. No wheezes, rales, or rhonchi. No distress.  Heart:  S1, S2 present without murmurs appreciated.  Abdomen:  +BS, soft, obese, non-tender and non-distended. Difficult to palpate any HSM due to large AP diameter.  Rectal:  Deferred until colonoscopy.  Msk:  Symmetrical without gross deformities. Normal posture. Extremities:  Chronic venous stasis changes, 1+ lower extremity edema.  Neurologic:  Alert and  oriented x4;  grossly normal neurologically. Skin:  Intact without significant lesions or rashes. Psych:  Alert and cooperative. Normal mood and affect.

## 2013-06-07 ENCOUNTER — Ambulatory Visit (INDEPENDENT_AMBULATORY_CARE_PROVIDER_SITE_OTHER): Payer: BC Managed Care – PPO | Admitting: Cardiology

## 2013-06-07 ENCOUNTER — Encounter: Payer: Self-pay | Admitting: Cardiology

## 2013-06-07 VITALS — BP 171/95 | HR 86 | Ht 68.0 in | Wt 372.0 lb

## 2013-06-07 DIAGNOSIS — I10 Essential (primary) hypertension: Secondary | ICD-10-CM

## 2013-06-07 DIAGNOSIS — I509 Heart failure, unspecified: Secondary | ICD-10-CM

## 2013-06-07 DIAGNOSIS — R079 Chest pain, unspecified: Secondary | ICD-10-CM

## 2013-06-07 DIAGNOSIS — I5032 Chronic diastolic (congestive) heart failure: Secondary | ICD-10-CM

## 2013-06-07 DIAGNOSIS — I50810 Right heart failure, unspecified: Secondary | ICD-10-CM

## 2013-06-07 NOTE — Patient Instructions (Signed)
Your physician wants you to follow-up in: 6 months You will receive a reminder letter in the mail two months in advance. If you don't receive a letter, please call our office to schedule the follow-up appointment.   Your physician has requested that you have en exercise stress myoview. For further information please visit [email or web address]. Please follow instruction sheet, as given.  HOLD the Diltiazem the morning of the the test    Thank you for choosing [facility] !

## 2013-06-07 NOTE — Addendum Note (Signed)
Addended by: Barbarann Ehlers A on: 06/07/2013 10:29 AM   Modules accepted: Orders

## 2013-06-07 NOTE — Progress Notes (Signed)
NUC med study changed to 2 day study per MD,LM for pt with new test date for Tuesday 5/27

## 2013-06-07 NOTE — Progress Notes (Signed)
Clinical Summary Ms. Cadet is a 53 y.o.female seen today for follow up of the following medical problems.   1. Respiratory failure  - admit 11/2012 with hypoxic and hypercapneic respiratory failure requiring intubation  - Echo showed normal LV systolic function, elevated LA pressures, and RV pressure/volume overload with normal function, PASP 50 mmHg. She has multiple potential etiologies for her right sided dysfunction including diastolic dysfunction, OSA, obesity hypovent, and COPD  - she diuresed >20 liters during admission, discharge weight approx 387 lbs.  - required tracheostomy due to long ventilator course, now removed.  - she is followed by pulmonary [place]   2. Chronic diastolic heart failure  - discharge weight approx 387 lbs after greater than 20 liters of diuresis in 11/2012 - DOE at 50 yards which is better. No orthopnea, no PND. Denies any LE edema - weights daily, typically 369 and overall stable    3. Right sided heart failure  - noted during recent admit, initial echo showed severely enlarged RV with D-shaped septum with preserved function. Repeat echoes as pulmonary status and fluid status removed showed normal sized RV and function.   4. OSA  - compliant with CPAP machine.   5. HTN - compliant with meds - checks at home twice a week, typically around 120s/90s  6. Chest pressure - 3 times over the last month. Cramping pain in mid chest, 4/10. No other associated symptoms. At rest or with activty, often end of day after activities. Lasts for 1- 5 minutes. Nothing makes better or worst. CAD risk factors: HTN, former smoker, father MI 65s.    Past Medical History  Diagnosis Date  . Hypertension   . GERD (gastroesophageal reflux disease)   . Sleep apnea   . Heart failure   . COPD (chronic obstructive pulmonary disease)   . Hypothyroid      Allergies  Allergen Reactions  . Augmentin [Amoxicillin-Pot Clavulanate] Other (See Comments)   thrush     Current Outpatient Prescriptions  Medication Sig Dispense Refill  . albuterol (PROVENTIL HFA;VENTOLIN HFA) 108 (90 BASE) MCG/ACT inhaler Inhale 2 puffs into the lungs every 6 (six) hours as needed for wheezing or shortness of breath.  1 Inhaler  6  . aspirin 81 MG chewable tablet Chew 1 tablet (81 mg total) by mouth daily.      . Biotin 5000 MCG TABS Take 1 tablet by mouth daily.      Marland Kitchen buPROPion (WELLBUTRIN XL) 150 MG 24 hr tablet Take 1 tablet by mouth daily.      . cetirizine (ZYRTEC) 10 MG tablet Take 10 mg by mouth daily.      . Cholecalciferol (VITAMIN D3) 2000 UNITS TABS Take by mouth daily.      . Cyanocobalamin (VITAMIN B-12 CR PO) Take 1 tablet by mouth daily.      Marland Kitchen diltiazem (DILACOR XR) 180 MG 24 hr capsule Take 1 capsule (180 mg total) by mouth daily.  90 capsule  3  . diphenhydramine-acetaminophen (TYLENOL PM) 25-500 MG TABS Take 1 tablet by mouth at bedtime as needed.      Marland Kitchen esomeprazole (NEXIUM) 40 MG capsule Take by mouth daily.      . fluticasone (FLONASE) 50 MCG/ACT nasal spray Place 2 sprays into both nostrils 2 (two) times daily.  16 g  11  . fluticasone (FLONASE) 50 MCG/ACT nasal spray Place 2 sprays into both nostrils 2 (two) times daily.  16 g  3  . furosemide (LASIX)  40 MG tablet Take 40 mg by mouth 2 (two) times daily.      Marland Kitchen ibuprofen (ADVIL,MOTRIN) 200 MG tablet Take 200 mg by mouth every 6 (six) hours as needed.      Marland Kitchen levothyroxine (SYNTHROID, LEVOTHROID) 50 MCG tablet Take 75 mcg by mouth.       . lidocaine (LIDODERM) 5 % Place onto the skin daily.      Marland Kitchen losartan (COZAAR) 100 MG tablet Place 1 tablet (100 mg total) into feeding tube daily.      . methocarbamol (ROBAXIN) 500 MG tablet Take 500 mg by mouth 4 (four) times daily.      . Multiple Vitamins-Minerals (MULTIVITAMIN WITH MINERALS) tablet Take 1 tablet by mouth daily.      Marland Kitchen PATADAY 0.2 % SOLN       . peg 3350 powder (MOVIPREP) 100 G SOLR Take 1 kit (200 g total) by mouth as directed.  1 kit   0  . tiotropium (SPIRIVA) 18 MCG inhalation capsule Place 1 capsule (18 mcg total) into inhaler and inhale daily.  90 capsule  0   No current facility-administered medications for this visit.     Past Surgical History  Procedure Laterality Date  . Achilles tendon surgery    . Back surgery      x4  . Tonsilectomy, adenoidectomy, bilateral myringotomy and tubes    . Tracheostomy      11/2012  . Tracheostomy closure      11/2012  . Colonoscopy N/A 05/24/2013    Procedure: COLONOSCOPY;  Surgeon: Daneil Dolin, MD;  Location: [facility];  Service: Endoscopy;  Laterality: N/A;  3:00 PM     Allergies  Allergen Reactions  . Augmentin [Amoxicillin-Pot Clavulanate] Other (See Comments)    thrush      Family History  Problem Relation Age of Onset  . Diabetes Sister   . Emphysema Father   . Asthma Sister   . Heart disease Father   . Heart disease Mother   . Cancer Father     lung  . Colon cancer Neg Hx      Social History Ms. Pohlmann reports that she quit smoking about 6 months ago. Her smoking use included Cigarettes. She has a 80 pack-year smoking history. She has never used smokeless tobacco. Ms. Duggin reports that she does not drink alcohol.   Review of Systems CONSTITUTIONAL: No weight loss, fever, chills, weakness or fatigue.  HEENT: Eyes: No visual loss, blurred vision, double vision or yellow sclerae.No hearing loss, sneezing, congestion, runny nose or sore throat.  SKIN: No rash or itching.  CARDIOVASCULAR: per HPI RESPIRATORY: per HPI  GASTROINTESTINAL: No anorexia, nausea, vomiting or diarrhea. No abdominal pain or blood.  GENITOURINARY: No burning on urination, no polyuria NEUROLOGICAL: No headache, dizziness, syncope, paralysis, ataxia, numbness or tingling in the extremities. No change in bowel or bladder control.  MUSCULOSKELETAL: No muscle, back pain, joint pain or stiffness.  LYMPHATICS: No enlarged nodes. No history of splenectomy.  PSYCHIATRIC: No  history of depression or anxiety.  ENDOCRINOLOGIC: No reports of sweating, cold or heat intolerance. No polyuria or polydipsia.  Marland Kitchen   Physical Examination p 86 bp 130/90 Wt 373 BMI 56 Gen: resting comfortably, no acute distress HEENT: no scleral icterus, pupils equal round and reactive, no palptable cervical adenopathy,  CV: RRR, no m/r/g, no JVD Resp: Clear to auscultation bilaterally GI: abdomen is soft, non-tender, non-distended, normal bowel sounds, no hepatosplenomegaly MSK: extremities are warm, trace bilateral  edema  Skin: warm, no rash Neuro:  no focal deficits Psych: appropriate affect   Diagnostic Studies 12/04/2012 Echo  Technically difficult study, LVEF 82-08%, grade I diastolic dysfunction, mild AS, cannot estimate PASP, normal IVC, normal RV   11/24/12 Echo  LVEF 55-60%, moderate RV dilatation with normal function   11/20/12 Echo  LVEF 65-70%, grade II diastolic dysfunction, flattened RV septum in diastole and systole, severe RV dilatatoin, PASP 9mmHg   02/2013 PFTs Obstruction and diffusion defect suggesting emphysema, however absence of hyperinflaction. Overall minimal obstructive disease.  Assessment and Plan   1. Respiratory failure  - long recent admission with respiratory failure, difficult to wean vent requiring tracheostomy. Now recovered, trach has been removed  - multiple likely pulmonary issues including likely COPD, OSA, and obesity hypovent  - continue regular pulm follow up  2. Diastolic heart failure  - diuresed >20 liters during recent admit. Discharge weight 387, now down to 369 since 11/2012.  - continue current lasix, continue daily weights, instructed ok to increase lasix as needed for weight gain or increased SOB   3. Right sided heart failure - improved with diuresis during recent admission, continue management for diastolic heart failure and chronic resp issues  4. OSA - continue CPAP  5. HTN - at goal, continue current meds  6.  Chest pressure - mixed symptoms for cardiac cause, she does have multiple CAD risk factors - will obtain exercise nuclear stress test 2 day protocol due to her body habitus to further evaluate  F/u 6 months   Arnoldo Lenis, M.D., F.A.C.C.

## 2013-06-11 ENCOUNTER — Encounter (HOSPITAL_COMMUNITY): Payer: BC Managed Care – PPO

## 2013-06-15 ENCOUNTER — Encounter (HOSPITAL_COMMUNITY): Payer: BC Managed Care – PPO

## 2013-06-15 ENCOUNTER — Ambulatory Visit (HOSPITAL_COMMUNITY): Payer: BC Managed Care – PPO

## 2013-06-16 ENCOUNTER — Encounter (HOSPITAL_COMMUNITY): Payer: BC Managed Care – PPO

## 2013-06-21 ENCOUNTER — Inpatient Hospital Stay (HOSPITAL_COMMUNITY): Admission: RE | Admit: 2013-06-21 | Payer: BC Managed Care – PPO | Source: Ambulatory Visit

## 2013-06-21 ENCOUNTER — Encounter (HOSPITAL_COMMUNITY): Payer: BC Managed Care – PPO

## 2013-06-22 ENCOUNTER — Encounter (HOSPITAL_COMMUNITY): Payer: BC Managed Care – PPO

## 2013-06-28 ENCOUNTER — Encounter (HOSPITAL_COMMUNITY): Payer: Self-pay

## 2013-06-28 ENCOUNTER — Encounter (HOSPITAL_COMMUNITY)
Admission: RE | Admit: 2013-06-28 | Discharge: 2013-06-28 | Disposition: A | Discharge status: Home / Self Care | Payer: BC Managed Care – PPO | Source: Ambulatory Visit | Attending: Cardiology | Admitting: Cardiology

## 2013-06-28 ENCOUNTER — Ambulatory Visit (HOSPITAL_COMMUNITY)
Admission: RE | Admit: 2013-06-28 | Discharge: 2013-06-28 | Disposition: A | Discharge status: Home / Self Care | Payer: BC Managed Care – PPO | Source: Ambulatory Visit | Attending: Cardiology | Admitting: Cardiology

## 2013-06-28 DIAGNOSIS — R079 Chest pain, unspecified: Secondary | ICD-10-CM

## 2013-06-28 DIAGNOSIS — I10 Essential (primary) hypertension: Secondary | ICD-10-CM | POA: Insufficient documentation

## 2013-06-28 HISTORY — DX: Heart failure, unspecified: I50.9

## 2013-06-28 MED ORDER — TECHNETIUM TC 99M SESTAMIBI GENERIC - CARDIOLITE
30.0000 | Freq: Once | INTRAVENOUS | Status: AC | PRN
  Administered 2013-06-28: 30 via INTRAVENOUS

## 2013-06-28 MED ORDER — SODIUM CHLORIDE 0.9 % IJ SOLN
INTRAMUSCULAR | Status: AC
  Administered 2013-06-28: 10 mL via INTRAVENOUS
  Filled 2013-06-28: qty 10

## 2013-06-28 NOTE — Progress Notes (Signed)
Stress Lab Nurses Notes - Meghan Hoover  Meghan Hoover 06/28/2013 Reason for doing test: Chest Pain Type of test: Wille Glaser / 2 day study Nurse performing test: Gerrit Halls, RN Nuclear Medicine Tech: Melburn Hake Echo Tech: Not Applicable MD performing test: Meghan Hoover Family MD: Meghan Hoover Test explained and consent signed: yes IV started: 22g jelco, Saline lock flushed and No redness or edema Symptoms: SOB & Fatigue ( patient inhaler used from home )  Treatment/Intervention: None Reason test stopped: reached target HR and SOB After recovery IV was: Discontinued via X-ray tech and No redness or edema Patient to return to [place]. Med at : 11:50 Patient discharged: Home Patient's Condition upon discharge was: stable Comments: During test peak BP 167/106 on Treadmill & during recovery 200/72 . Peak HR 146 while on Treadmill.  Recovery BP 130/88 & HR 92. [name]

## 2013-06-29 ENCOUNTER — Encounter (HOSPITAL_COMMUNITY)
Admission: RE | Admit: 2013-06-29 | Discharge: 2013-06-29 | Disposition: A | Discharge status: Home / Self Care | Payer: BC Managed Care – PPO | Source: Ambulatory Visit | Attending: Cardiology | Admitting: Cardiology

## 2013-06-29 DIAGNOSIS — R079 Chest pain, unspecified: Secondary | ICD-10-CM

## 2013-06-29 MED ORDER — TECHNETIUM TC 99M SESTAMIBI GENERIC - CARDIOLITE
25.0000 | Freq: Once | INTRAVENOUS | Status: AC | PRN
  Administered 2013-06-29: 25 via INTRAVENOUS

## 2013-08-20 ENCOUNTER — Encounter: Payer: BC Managed Care – PPO | Admitting: Adult Health

## 2013-08-20 ENCOUNTER — Ambulatory Visit: Payer: BC Managed Care – PPO | Admitting: Adult Health

## 2013-08-20 NOTE — Progress Notes (Signed)
Error

## 2013-08-23 ENCOUNTER — Ambulatory Visit (INDEPENDENT_AMBULATORY_CARE_PROVIDER_SITE_OTHER): Payer: BC Managed Care – PPO | Admitting: Adult Health

## 2013-08-23 ENCOUNTER — Encounter: Payer: Self-pay | Admitting: Adult Health

## 2013-08-23 ENCOUNTER — Ambulatory Visit (INDEPENDENT_AMBULATORY_CARE_PROVIDER_SITE_OTHER)
Admission: RE | Admit: 2013-08-23 | Discharge: 2013-08-23 | Disposition: A | Discharge status: Home / Self Care | Payer: BC Managed Care – PPO | Source: Ambulatory Visit | Attending: Adult Health | Admitting: Adult Health

## 2013-08-23 VITALS — BP 134/76 | HR 90 | Temp 97.8°F | Ht 68.0 in | Wt 379.2 lb

## 2013-08-23 DIAGNOSIS — J449 Chronic obstructive pulmonary disease, unspecified: Secondary | ICD-10-CM

## 2013-08-23 DIAGNOSIS — J4489 Other specified chronic obstructive pulmonary disease: Secondary | ICD-10-CM

## 2013-08-23 MED ORDER — PREDNISONE 10 MG PO TABS: ORAL_TABLET | ORAL | Status: DC

## 2013-08-23 MED ORDER — CEFDINIR 300 MG PO CAPS: 300.0000 mg | ORAL_CAPSULE | Freq: Two times a day (BID) | ORAL | Status: DC

## 2013-08-23 NOTE — Patient Instructions (Signed)
Omnicef 300mg  Twice daily  For 7 days -take with food.  Mucinex DM Twice daily  As needed  Cough/congestion  Prednisone taper over next week.  Chest xray today  Please contact office for sooner follow up if symptoms do not improve or worsen or seek emergency care   follow up Dr. Lamonte Sakai  As planned and As needed

## 2013-08-23 NOTE — Progress Notes (Signed)
   Subjective:    Patient ID: Meghan Hoover, female    DOB: January 19, 1961, 53 y.o.   MRN: [id number]  Shortness of Breath Associated symptoms include leg swelling and rhinorrhea. Pertinent negatives include no ear pain, fever, headaches, rash, sore throat, vomiting or wheezing.   53 yo woman, former smoker (35 pk-yrs), OSA, HTN, GERD. Also with hx critical care illness due to cor pulmonale in setting of apparent LE cellulitis with intubation, trach, LTAC admission in 11/'14. She is referred today for dyspnea, possible COPD. She underwent PFT 03/12/13, shows mild to mod mixed disease, no BD response.  She has exertional SOB, some congestion. Coughs up phlegm regularly. Good CPAP compliance.   ROV 05/27/13 -- follows up for dyspnea, OSA/OHS, COPD based on mixed disease on spiro.  She did not desaturate but only walked one lap. We did trial Spiriva last time > she feels that she has benefited. Able to do more activity > she has started spring cleaning. Wears her CPAP mask reliably.   08/23/2013 COPD  Complains of 2-3 months that breathing is not as good.  Over last 2weeks more cough and congestion . Pain on inspiration. Feels heat and humidity are making breathing worse.  She denies any hemoptysis, orthopnea, PND, exertional chest pain, or edema. CXR today without acute process.  Taking Spiriva daily .  Wears. CPAP At bedtime     Review of Systems  Constitutional: Negative for fever and unexpected weight change.  HENT:  Negative for dental problem, ear pain, nosebleeds, sneezing, sore throat and trouble swallowing.   Eyes: Negative for redness and itching.  Respiratory: Positive for cough and shortness of breath. Negative for chest tightness and wheezing.   Cardiovascular: Positive for leg swelling. Negative for palpitations.  Gastrointestinal: Negative for nausea and vomiting.  Genitourinary: Negative for dysuria.  Musculoskeletal: Negative for joint swelling.  Skin: Negative for rash.   Neurological: Negative for headaches.  Hematological: Does not bruise/bleed easily.  Psychiatric/Behavioral: Negative for dysphoric mood. The patient is not nervous/anxious.         Objective:   Physical Exam  Gen: Pleasant, obese woman, , in no distress,  normal affect  ENT: No lesions,  mouth clear,  oropharynx clear, no postnasal drip  Neck: No JVD, no TMG, no carotid bruits, well healed trach scar  Lungs: few rhonchi noted   Cardiovascular: RRR, heart sounds normal, no murmur or gallops,tr 1+ peripheral edema  Musculoskeletal: No deformities, no cyanosis or clubbing  Neuro: alert, non focal  Skin: Warm, no lesions or rashes   CXR 08/23/13 underlying emphysematous change. No edema or consolidation.     Assessment & Plan:

## 2013-08-24 ENCOUNTER — Encounter: Payer: BC Managed Care – PPO | Admitting: Adult Health

## 2013-08-24 ENCOUNTER — Encounter: Payer: Self-pay | Admitting: Adult Health

## 2013-08-24 ENCOUNTER — Ambulatory Visit (INDEPENDENT_AMBULATORY_CARE_PROVIDER_SITE_OTHER): Payer: BC Managed Care – PPO | Admitting: Adult Health

## 2013-08-24 VITALS — BP 114/70 | HR 100 | Ht 68.0 in | Wt 382.0 lb

## 2013-08-24 DIAGNOSIS — R600 Localized edema: Secondary | ICD-10-CM

## 2013-08-24 DIAGNOSIS — I10 Essential (primary) hypertension: Secondary | ICD-10-CM

## 2013-08-24 DIAGNOSIS — R609 Edema, unspecified: Secondary | ICD-10-CM

## 2013-08-24 NOTE — Assessment & Plan Note (Signed)
She is to continue following pulmonologist.

## 2013-08-24 NOTE — Progress Notes (Deleted)
Name: Meghan Hoover    DOB: 11-30-1960  Age: 53 y.o.  MR#: [id number]       PCP:  Leonides Grills, MD      Insurance: Payor: [employer] / Plan: BCBS PPO OUT OF STATE / Product Type: *No Product type* /   CC:   No chief complaint on file.   VS Filed Vitals:   08/24/13 1525  BP: 114/70  Pulse: 100  Height: 5\' 8"  (1.727 m)  Weight: 382 lb (173.274 kg)    Weights Current Weight  08/24/13 382 lb (173.274 kg)  08/23/13 379 lb 3.2 oz (172.004 kg)  06/07/13 372 lb (168.738 kg)    Blood Pressure  BP Readings from Last 3 Encounters:  08/24/13 114/70  08/23/13 134/76  06/07/13 171/95     Admit date:  (Not on file) Last encounter with RMR:  Visit date not found   Allergy Augmentin  Current Outpatient Prescriptions  Medication Sig Dispense Refill  . albuterol (PROVENTIL HFA;VENTOLIN HFA) 108 (90 BASE) MCG/ACT inhaler Inhale 2 puffs into the lungs every 6 (six) hours as needed for wheezing or shortness of breath.  1 Inhaler  6  . aspirin 81 MG chewable tablet Chew 1 tablet (81 mg total) by mouth daily.      . Biotin 5000 MCG TABS Take 1 tablet by mouth daily.      Marland Kitchen buPROPion (WELLBUTRIN XL) 150 MG 24 hr tablet Take 1 tablet by mouth daily.      . cefdinir (OMNICEF) 300 MG capsule Take 1 capsule (300 mg total) by mouth 2 (two) times daily.  14 capsule  0  . cetirizine (ZYRTEC) 10 MG tablet Take 10 mg by mouth daily.      . Cholecalciferol (VITAMIN D3) 2000 UNITS TABS Take by mouth daily.      . Cyanocobalamin (VITAMIN B-12 CR PO) Take 1 tablet by mouth daily.      Marland Kitchen diltiazem (DILACOR XR) 180 MG 24 hr capsule Take 1 capsule (180 mg total) by mouth daily.  90 capsule  3  . esomeprazole (NEXIUM) 40 MG capsule Take by mouth daily.      . fluticasone (FLONASE) 50 MCG/ACT nasal spray Place 2 sprays into both nostrils 2 (two) times daily.  16 g  11  . furosemide (LASIX) 40 MG tablet Take 40 mg by mouth 2 (two) times daily.      Marland Kitchen ibuprofen (ADVIL,MOTRIN) 200 MG tablet Take  200 mg by mouth every 6 (six) hours as needed.      Marland Kitchen levothyroxine (SYNTHROID, LEVOTHROID) 50 MCG tablet Take 75 mcg by mouth.       . lidocaine (LIDODERM) 5 % Place onto the skin daily.      Marland Kitchen losartan (COZAAR) 100 MG tablet Place 1 tablet (100 mg total) into feeding tube daily.      . methocarbamol (ROBAXIN) 500 MG tablet Take 500 mg by mouth 4 (four) times daily.      . Multiple Vitamins-Minerals (MULTIVITAMIN WITH MINERALS) tablet Take 1 tablet by mouth daily.      . predniSONE (DELTASONE) 10 MG tablet 4 tabs for 2 days, then 3 tabs for 2 days, 2 tabs for 2 days, then 1 tab for 2 days, then stop  20 tablet  0  . tiotropium (SPIRIVA) 18 MCG inhalation capsule Place 1 capsule (18 mcg total) into inhaler and inhale daily.  90 capsule  0  . traZODone (DESYREL) 50 MG tablet Take 50 mg by  mouth at bedtime.       No current facility-administered medications for this visit.    Discontinued Meds:    Medications Discontinued During This Encounter  Medication Reason  . diphenhydramine-acetaminophen (TYLENOL PM) 25-500 MG TABS Error  . fluticasone (FLONASE) 50 MCG/ACT nasal spray Error  . PATADAY 0.2 % SOLN Error    Patient Active Problem List   Diagnosis Date Noted  . Encounter for screening colonoscopy 05/20/2013  . COPD (chronic obstructive pulmonary disease) 04/30/2013  . Allergic rhinitis 04/30/2013  . 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 CAP (community acquired pneumonia) 11/21/2012  . Sepsis 11/21/2012  . Acute diastolic congestive heart failure 11/20/2012  . Acute respiratory failure with hypoxia 11/19/2012  . Volume overload 11/19/2012  . Bilateral lower extremity edema 11/19/2012  . HTN (hypertension) 11/19/2012  . Normocytic anemia 11/19/2012  . Tobacco dependence 11/19/2012  . ACHILLES TENDINITIS 03/23/2008    LABS    Component Value Date/Time   NA 148* 12/09/2012 0510   NA 147* 12/08/2012 0439   NA 150* 12/07/2012 0450   K 3.2* 12/09/2012 0510   K 3.3* 12/08/2012  0439   K 3.5 12/07/2012 0450   CL 107 12/09/2012 0510   CL 105 12/08/2012 0439   CL 107 12/07/2012 0450   CO2 30 12/09/2012 0510   CO2 32 12/08/2012 0439   CO2 31 12/07/2012 0450   GLUCOSE 128* 12/09/2012 0510   GLUCOSE 144* 12/08/2012 0439   GLUCOSE 148* 12/07/2012 0450   BUN 42* 12/09/2012 0510   BUN 42* 12/08/2012 0439   BUN 42* 12/07/2012 0450   CREATININE 0.63 12/09/2012 0510   CREATININE 0.68 12/08/2012 0439   CREATININE 0.80 12/07/2012 0450   CALCIUM 10.1 12/09/2012 0510   CALCIUM 10.3 12/08/2012 0439   CALCIUM 10.4 12/07/2012 0450   GFRNONAA >90 12/09/2012 0510   GFRNONAA >90 12/08/2012 0439   GFRNONAA 83* 12/07/2012 0450   GFRAA >90 12/09/2012 0510   GFRAA >90 12/08/2012 0439   GFRAA >90 12/07/2012 0450   CMP     Component Value Date/Time   NA 148* 12/09/2012 0510   K 3.2* 12/09/2012 0510   CL 107 12/09/2012 0510   CO2 30 12/09/2012 0510   GLUCOSE 128* 12/09/2012 0510   BUN 42* 12/09/2012 0510   CREATININE 0.63 12/09/2012 0510   CALCIUM 10.1 12/09/2012 0510   PROT 7.5 12/01/2012 1144   ALBUMIN 3.3* 12/01/2012 1144   AST 12 12/01/2012 1144   ALT 14 12/01/2012 1144   ALKPHOS 66 12/01/2012 1144   BILITOT 0.6 12/01/2012 1144   GFRNONAA >90 12/09/2012 0510   GFRAA >90 12/09/2012 0510       Component Value Date/Time   WBC 11.7* 12/08/2012 0439   WBC 15.4* 12/07/2012 0450   WBC 17.4* 12/06/2012 0433   HGB 12.4 12/08/2012 0439   HGB 12.7 12/07/2012 0450   HGB 12.9 12/06/2012 0433   HCT 43.6 12/08/2012 0439   HCT 43.9 12/07/2012 0450   HCT 45.7 12/06/2012 0433   MCV 78.8 12/08/2012 0439   MCV 80.6 12/07/2012 0450   MCV 80.9 12/06/2012 0433    Lipid Panel     Component Value Date/Time   TRIG 162* 12/04/2012 0410    ABG    Component Value Date/Time   PHART 7.438 03/12/2013 1122   PCO2ART 43.9 03/12/2013 1122   PO2ART 60.0* 03/12/2013 1122   HCO3 29.2* 03/12/2013 1122   TCO2 25.9 03/12/2013 1122   O2SAT 91.2 03/12/2013 1122     No  results found  for this basename: TSH   BNP (last 3 results)  Recent Labs  11/19/12 1428 12/01/12 1144  PROBNP 3418.0* 454.0*   Cardiac Panel (last 3 results) No results found for this basename: CKTOTAL, CKMB, TROPONINI, RELINDX,  in the last 72 hours  Iron/TIBC/Ferritin/ %Sat No results found for this basename: iron, tibc, ferritin, ironpctsat     EKG Orders placed in visit on 06/07/13  . EKG 12-LEAD     Prior Assessment and Plan Problem List as of 08/24/2013     Cardiovascular and Mediastinum   HTN (hypertension)   Acute diastolic congestive heart failure     Respiratory   Acute respiratory failure with hypoxia   CAP (community acquired pneumonia)   COPD (chronic obstructive pulmonary disease)   Last Assessment & Plan   05/27/2013 Office Visit Written 05/27/2013  3:02 PM by Collene Gobble, MD     - continue spiriva - albuterol prn - rov 4    Allergic rhinitis   Last Assessment & Plan   05/27/2013 Office Visit Written 05/27/2013  3:03 PM by Collene Gobble, MD     - continue flonase      Other   ACHILLES TENDINITIS   Volume overload   Bilateral lower extremity edema   Normocytic anemia   Tobacco dependence   Sepsis   Tracheostomy status   Encounter for screening colonoscopy   Last Assessment & Plan   05/20/2013 Office Visit Written 05/20/2013  2:29 PM by Orvil Feil, NP     53 year old female with need for initial screening colonoscopy. No concerning lower or upper GI symptoms at this time. Despite prolonged hospitalization in Oct-Dec 2014 for respiratory failure, she is doing quite well and clinically stable. Actively working on weight loss through a low-carb diet. Remains on nasal CPAP at night. May benefit from bringing mask to endoscopy for procedure; will ask Dr. Gala Romney.  Proceed with TCS with Dr. Gala Romney in near future: the risks, benefits, and alternatives have been discussed with the patient in detail. The patient states understanding and desires to proceed.          Imaging: Dg Chest 2 View  08/24/2013   CLINICAL DATA:  Difficulty breathing and cough  EXAM: CHEST  2 VIEW  COMPARISON:  December 08, 2012  FINDINGS: There is a degree of underlying emphysematous change. There is slight scarring in the left base. There is no edema or consolidation. Heart is slightly enlarged with pulmonary vascularity within normal limits. No adenopathy. There is postoperative change in the lower cervical spine. There is degenerative change in the thoracic spine.  IMPRESSION: There is a degree of underlying emphysematous change. No edema or consolidation. Heart mildly enlarged but stable.   Electronically Signed   By: Lowella Grip M.D.   On: 08/24/2013 08:13

## 2013-08-24 NOTE — Assessment & Plan Note (Signed)
Blood pressure is low normal 114/70 compared to 134/76 earlier recordings. She has taken a good bit of Lasix today. I advised her not to over diuresis to prevent hypotension and dehydration. She is on prednisone tablets which could be causing some of her fluid retention. She also is on diltiazem for blood pressure control which also can cause some dependent edema which have explained to her. She continues on losartan 100 mg daily. If she becomes lightheaded or dizzy but advised to drink fluids. She is only to be on Lasix 40 mg twice a day, she is to avoid salty foods.

## 2013-08-24 NOTE — Progress Notes (Signed)
HPI: HPI: Meghan Hoover is a 52 year old patient of Dr. branch we follow for ongoing assessment and management of chronic diastolic CHF, right-sided heart failure, hypertension, with history obstructive sleep apnea and hypoxic and hypercarbic respiratory failure requiring intubation on recent hospitalization November 2014. She was seen by Dr. Harl Bowie in May of 2015. She was continued on current medication regimen and advised on daily weights and instructed to increase Lasix as needed for weight gain or increased shortness of breath. Her blood pressure was at goal, but due to symptoms of mild chest pressure she was planned for a two-day nuclear stress test.   This is completed on 06/29/2013:  Low risk exercise Cardiolite as outlined. Patient had limited  exercise tolerance due to shortness of breath and fatigue, achieved  a maximum work load of 4.6 METS. There were no diagnostic ST segment  abnormalities or arrhythmias. Perfusion imaging is most consistent  with breast tissue attenuation as well as variable diaphragmatic  attenuation, cannot completely exclude a mild degree of inferior  ischemia as outlined. LVEF is vigorous at 75% with normal volumes  and wall motion.  She comes today without any new complaints. She is very concerned about the fact that she continues to retain fluid. She is often taking up to 120 mg of Lasix daily. She is easily tearful, depressed, and talks at length about her problems and physical limitations.  Allergies  Allergen Reactions  . Augmentin [Amoxicillin-Pot Clavulanate] Other (See Comments)    thrush    Current Outpatient Prescriptions  Medication Sig Dispense Refill  . albuterol (PROVENTIL HFA;VENTOLIN HFA) 108 (90 BASE) MCG/ACT inhaler Inhale 2 puffs into the lungs every 6 (six) hours as needed for wheezing or shortness of breath.  1 Inhaler  6  . aspirin 81 MG chewable tablet Chew 1 tablet (81 mg total) by mouth daily.      . Biotin 5000 MCG TABS Take 1  tablet by mouth daily.      Marland Kitchen buPROPion (WELLBUTRIN XL) 150 MG 24 hr tablet Take 1 tablet by mouth daily.      . cefdinir (OMNICEF) 300 MG capsule Take 1 capsule (300 mg total) by mouth 2 (two) times daily.  14 capsule  0  . cetirizine (ZYRTEC) 10 MG tablet Take 10 mg by mouth daily.      . Cholecalciferol (VITAMIN D3) 2000 UNITS TABS Take by mouth daily.      . Cyanocobalamin (VITAMIN B-12 CR PO) Take 1 tablet by mouth daily.      Marland Kitchen diltiazem (DILACOR XR) 180 MG 24 hr capsule Take 1 capsule (180 mg total) by mouth daily.  90 capsule  3  . esomeprazole (NEXIUM) 40 MG capsule Take by mouth daily.      . fluticasone (FLONASE) 50 MCG/ACT nasal spray Place 2 sprays into both nostrils 2 (two) times daily.  16 g  11  . furosemide (LASIX) 40 MG tablet Take 40 mg by mouth 2 (two) times daily.      Marland Kitchen ibuprofen (ADVIL,MOTRIN) 200 MG tablet Take 200 mg by mouth every 6 (six) hours as needed.      Marland Kitchen levothyroxine (SYNTHROID, LEVOTHROID) 50 MCG tablet Take 75 mcg by mouth.       . lidocaine (LIDODERM) 5 % Place onto the skin daily.      Marland Kitchen losartan (COZAAR) 100 MG tablet Place 1 tablet (100 mg total) into feeding tube daily.      . methocarbamol (ROBAXIN) 500 MG tablet Take 500  mg by mouth 4 (four) times daily.      . Multiple Vitamins-Minerals (MULTIVITAMIN WITH MINERALS) tablet Take 1 tablet by mouth daily.      . predniSONE (DELTASONE) 10 MG tablet 4 tabs for 2 days, then 3 tabs for 2 days, 2 tabs for 2 days, then 1 tab for 2 days, then stop  20 tablet  0  . tiotropium (SPIRIVA) 18 MCG inhalation capsule Place 1 capsule (18 mcg total) into inhaler and inhale daily.  90 capsule  0  . traZODone (DESYREL) 50 MG tablet Take 50 mg by mouth at bedtime.       No current facility-administered medications for this visit.    Past Medical History  Diagnosis Date  . Hypertension   . GERD (gastroesophageal reflux disease)   . Sleep apnea   . Heart failure   . COPD (chronic obstructive pulmonary disease)   .  Hypothyroid   . CHF (congestive heart failure)     Past Surgical History  Procedure Laterality Date  . Achilles tendon surgery    . Back surgery      x4  . Tonsilectomy, adenoidectomy, bilateral myringotomy and tubes    . Tracheostomy      11/2012  . Tracheostomy closure      11/2012  . Colonoscopy N/A 05/24/2013    Procedure: COLONOSCOPY;  Surgeon: Daneil Dolin, MD;  Location: [facility];  Service: Endoscopy;  Laterality: N/A;  3:00 PM    IRW:ERXVQM of systems complete and found to be negative unless listed above  PHYSICAL EXAM BP 114/70  Pulse 100  Ht 5\' 8"  (1.727 m)  Wt 382 lb (173.274 kg)  BMI 58.10 kg/m2 General: Well developed, well nourished, in no acute distress, morbidly obese Head: Eyes PERRLA, No xanthomas.   Normal cephalic and atramatic  Lungs: Clear bilaterally to auscultation and percussion. Heart: HRRR S1 S2, without MRG.  Pulses are 2+ & equal.            No carotid bruit. No JVD.  No abdominal bruits. No femoral bruits. Abdomen: Bowel sounds are positive, abdomen soft and non-tender without masses or                  Hernia's noted. Msk:  Back normal, normal gait. Normal strength and tone for age. Extremities: No clubbing, cyanosis or nonpitting pretibial edema.  DP +1 Neuro: Alert and oriented X 3. Psych:  Tearful affect, responds appropriately   ASSESSMENT AND PLAN

## 2013-08-24 NOTE — Assessment & Plan Note (Signed)
Flare with bronchitis  CXR w/out acute process  Plan  Omnicef 300mg  Twice daily  For 7 days -take with food.  Mucinex DM Twice daily  As needed  Cough/congestion  Prednisone taper over next week.  Please contact office for sooner follow up if symptoms do not improve or worsen or seek emergency care   follow up Dr. Lamonte Sakai  As planned and As needed

## 2013-08-24 NOTE — Assessment & Plan Note (Signed)
She is tearful and anxious about her ongoing weight issue. She states that her primary care physician would like to refer her to the bariatric center. She is uncertain about this. I have advised her that it is worth a conversation and assessment by that facility before making any definite decisions. She is willing to entertain any office visit to be more informed of the process.

## 2013-08-24 NOTE — Progress Notes (Signed)
    Cancelled Appt

## 2013-08-24 NOTE — Patient Instructions (Signed)
Your physician wants you to follow-up in: 6 months You will receive a reminder letter in the mail two months in advance. If you don't receive a letter, please call our office to schedule the follow-up appointment.     Your physician recommends that you continue on your current medications as directed. Please refer to the Current Medication list given to you today.      Thank you for choosing [facility] !        

## 2013-08-24 NOTE — Assessment & Plan Note (Signed)
I believe this is mostly related to obesity with lymphedema. I advised him not to over use diuretics to prevent dehydration and hypotension. He is improved today without evidence of pitting edema

## 2013-08-26 ENCOUNTER — Telehealth: Payer: Self-pay | Admitting: Emergency Medicine

## 2013-08-26 NOTE — Progress Notes (Signed)
Quick Note:  Spoke with pt and notified of results per Dr. Rexene Edison, NP.Pt verbalized understanding and denied any questions.  ______

## 2013-08-26 NOTE — Telephone Encounter (Signed)
  Notes Recorded by Melvenia Needles, NP on 08/24/2013 at 11:13 PM No sign of PNA  Cont w/ ov recs  Please contact office for sooner follow up if symptoms do not improve or worsen or seek emergency care        Spoke with pt and notified of results per TP. Pt verbalized understanding and denied any questions.

## 2013-09-14 ENCOUNTER — Other Ambulatory Visit: Payer: Self-pay | Admitting: *Deleted

## 2013-09-20 ENCOUNTER — Other Ambulatory Visit: Payer: Self-pay | Admitting: Emergency Medicine

## 2013-09-22 ENCOUNTER — Telehealth: Payer: Self-pay | Admitting: Emergency Medicine

## 2013-09-22 MED ORDER — TIOTROPIUM BROMIDE MONOHYDRATE 18 MCG IN CAPS: 18.0000 ug | ORAL_CAPSULE | Freq: Every day | RESPIRATORY_TRACT | Status: DC

## 2013-09-22 NOTE — Telephone Encounter (Signed)
Called spoke with pt. Aware refill has been sent. Nothing further needed

## 2013-10-14 ENCOUNTER — Encounter: Payer: BC Managed Care – PPO | Attending: Adult Health | Admitting: Nutrition

## 2013-10-14 NOTE — Patient Instructions (Signed)
Plan:  Aim for 3-4 Carb Choices per meal (45-60 grams) +/- 1 either way  Aim for 0-2 vegetables as snacks between meals if needed. Include protein in moderation with your meals Consider reading food labels for Total Carbohydrate and Fat Grams of foods Consider  increasing your activity level by 15 for 30 minutes daily as tolerated  Goals: Lose 5 lbs within the next 2 weeks.            Follow the Plate Method           Measure foods out for portion control           Drink 10 cups of water per day.           Walk around the outside of the house twice a day.

## 2013-10-15 ENCOUNTER — Other Ambulatory Visit: Payer: Self-pay | Admitting: Neurological Surgery

## 2013-10-15 DIAGNOSIS — M47812 Spondylosis without myelopathy or radiculopathy, cervical region: Secondary | ICD-10-CM

## 2013-10-18 NOTE — Progress Notes (Signed)
  Medical Nutrition Therapy:  Appt start time: 3716 end time:  1630.   Assessment:  Primary concerns today: Weight gain and morbid obesity. Seh wants to lose weight. She is here today with her husband. Her husband does the cooking and shopping. She is unable to walk very much due to breathing issues. History of CHF.     Preferred Learning Style:    No preference indicated   Learning Readiness:    Ready  MEDICATIONS: see list   DIETARY INTAKE:  24-hr recall:  B ( AM): 2-3 eggs, 2-3 slices bacon, and unsweet tea 24 oz  Snk ( AM): banana, apple, grapes or yogurt  L ( PM): Lettuce wrap with meat and cheese or grilled cheese and ham sandwich on ww bread. Or pita and hummus Snk ( PM): fruit, yogurt or cheese D ( PM): Cabbage soup 1-2 c, vegetarian pizza-4 slices, uknsweet tea Snk ( PM): 4 chocolate chip cookies, 1 cup ice cream and celery sticks, unsweet tea Beverages: unsweet tea all day long >84 oz per day.  Usual physical activity: not much  Estimated energy needs: 1800 calories 200 g carbohydrates 135 g protein 50 g fat  Progress Towards Goal(s):  In progress.   Nutritional Diagnosis:  NB-1.5 Disordered eating pattern As related to morbid obesity.  As evidenced by binge eating, diet recall and unbalanced meals and BMI > 40.    Intervention:  Nutrition counseling and healthy weight loss tips and importance of increased physical acitivity.  Plan:  For WEIGHT LOSS:  Aim for 3-4 Carb Choices per meal (45-60 grams) +/- 1 either way  Aim for 0-1 Carbs per snack if hungry  Include protein in moderation with your meals and snacks Consider  increasing your activity level by 15 to 30  minutes daily as tolerated Follow MY Plate as discussed  Measure foods out. Put food on plate and only eat at the kitchen table.  Cut back on unsweet tea and increase water intake to 3 bottles per day. Avoid snacks between meals unless it's vegetables only. Keep  A food journal  Goal: Lose  1-2 lbs per week.  Teaching Method Utilized:  Visual Auditory Hands on  Handouts given during visit include: Carb Counting for portion control and balanced meals Meal Plan Card  The Plate Method  Barriers to learning/adherence to lifestyle change: none  Demonstrated degree of understanding via:  Teach Back   Monitoring/Evaluation:  Dietary intake, exercise, meal planning food journal, and body weight in 2 weeks.

## 2013-10-19 ENCOUNTER — Other Ambulatory Visit: Payer: BC Managed Care – PPO

## 2013-10-23 ENCOUNTER — Ambulatory Visit
Admission: RE | Admit: 2013-10-23 | Discharge: 2013-10-23 | Disposition: A | Discharge status: Home / Self Care | Payer: BC Managed Care – PPO | Source: Ambulatory Visit | Attending: Neurological Surgery | Admitting: Neurological Surgery

## 2013-10-23 DIAGNOSIS — M47812 Spondylosis without myelopathy or radiculopathy, cervical region: Secondary | ICD-10-CM

## 2013-10-28 ENCOUNTER — Encounter: Payer: Self-pay | Admitting: Nutrition

## 2013-10-28 ENCOUNTER — Ambulatory Visit: Payer: BC Managed Care – PPO | Admitting: Nutrition

## 2013-10-28 NOTE — Progress Notes (Signed)
  Medical Nutrition Therapy:  Appt start time: 1400 end time:  1500.   Assessment:  Primary concerns today: obesity. Has had a stressful week. Had lost 12 lbs pounds and then gained 8 lbs back. Had been eating due to stressful situations. Eat more at night. Likes a lot of butter and cheese. Likes salty stuff. [place] and slipped on the deck when going for an MRI and hurt her back on Sept 29th.. Her diet went down hill after that. Had been following My Plate/Carb Meal Plan as discussed. Still feels depressed and wants to be referred somewhere. She is tearful today. Was recently increased on her Wellbutrin.  Preferred Learning Style:   Auditory  Visual  Hands on    Learning Readiness:  Not ready  Contemplating  Ready  Change in progress  MEDICATIONS: See list.   DIETARY INTAKE:  24-hr recall:  B ( AM): 2 eggs, 2 slices bacon or sausage, water or unsweet tea for breakfast or 1/2 banana with 2 eggs with yogurt Snk ( AM): none or maybe yogurt L ( PM): 1 slice bread, 2 oz ham, 1 oz cheese, 1 c broccoli, yogurt, celery and mayo/butter, water or unsweet tea Snk ( PM): spinach and vinegar D ( PM): Grilled chicken or steak, 2 vegetables, OR [place]ither style salad; boiled egg, romaine, [place], ham, blue cheese dressing on the side, 2 cornbreads, 2 biscuits, 6 butters, 2 jellys, water, Snk ( PM): pepper sticks or 1 cup ice cream Beverages: water  Diet is excessive in calories,mostly from fat in diet. She has issues with disordered eating habits.  Usual physical activity: Before she fell, she was walking in place for 5 minutes and recumbent bike 5 mins each day.  Estimated energy needs: 1500 calories 170g carbohydrates  g protein 112 g fat  Progress Towards Goal(s):  Making fair progress   Nutritional Diagnosis:  NB-1.1 Food and nutrition-related knowledge deficit As related to obesity.  As evidenced by BMI>40 and disordered eating habits.    Intervention:  Nutrition and weight loss  counseling . Plan:  Aim for 3-4 Carb Choices per meal (35-60 grams) +/- 1 either way  Include protein in moderation with your meals and snacks Consider reading food labels for Total Carbohydrate and Fat Grams of foods Consider  increasing your activity level by  Increase 10 minutes daily as tolerated Consider checking BG at alternate times per day as directed by MD  Consider taking medication as directed by MD Goals: 1. Cut down the fats at meals  2 servings per meal 2. Eliminate snacks between meals.3. Keep food journal daily. 4. Lose 1-2 lbs per week.   Teaching Method Utilized: Visual Auditory Hands on  Handouts given during visit include:      My Plate Carb Counting and Food Label handouts Meal Plan Card  Barriers to learning/adherence to lifestyle change: depressed feelings.   ###      Needs referral to psychiatry for depression. ####  Demonstrated degree of understanding via:  Teach Back   Monitoring/Evaluation:  Dietary intake, exercise, meal planning, and body weight in 2 week(s).   #######Please refer this patient to psychiatry due to depression.#####

## 2013-10-28 NOTE — Patient Instructions (Signed)
Plan:  Aim for 3-4 Carb Choices per meal (35-60 grams) +/- 1 either way  Include protein in moderation with your meals and snacks Consider reading food labels for Total Carbohydrate and Fat Grams of foods Consider  increasing your activity level by  Increase 10 minutes daily as tolerated Consider checking BG at alternate times per day as directed by MD  Consider taking medication as directed by MD 1. Cut down the fats at meals to 2 servings per meal 2. Eliminate snacks between meals.3. Keep food journal daily. 4. Lose 1-2 lbs per week.

## 2013-11-05 ENCOUNTER — Other Ambulatory Visit: Payer: Self-pay

## 2013-11-12 ENCOUNTER — Ambulatory Visit: Payer: BC Managed Care – PPO | Admitting: Nutrition

## 2013-11-24 ENCOUNTER — Other Ambulatory Visit: Payer: Self-pay | Admitting: Neurological Surgery

## 2013-11-24 DIAGNOSIS — M5416 Radiculopathy, lumbar region: Secondary | ICD-10-CM

## 2013-12-04 ENCOUNTER — Ambulatory Visit
Admission: RE | Admit: 2013-12-04 | Discharge: 2013-12-04 | Disposition: A | Discharge status: Home / Self Care | Payer: BC Managed Care – PPO | Source: Ambulatory Visit | Attending: Neurological Surgery | Admitting: Neurological Surgery

## 2013-12-04 DIAGNOSIS — M5416 Radiculopathy, lumbar region: Secondary | ICD-10-CM

## 2013-12-14 ENCOUNTER — Other Ambulatory Visit: Payer: Self-pay | Admitting: Adult Health

## 2013-12-14 ENCOUNTER — Other Ambulatory Visit: Payer: Self-pay | Admitting: Emergency Medicine

## 2013-12-20 ENCOUNTER — Telehealth: Payer: Self-pay | Admitting: Emergency Medicine

## 2013-12-20 MED ORDER — TIOTROPIUM BROMIDE MONOHYDRATE 18 MCG IN CAPS: 18.0000 ug | ORAL_CAPSULE | Freq: Every day | RESPIRATORY_TRACT | Status: DC

## 2013-12-20 NOTE — Telephone Encounter (Signed)
Spoke with [facility] States that the patient will have a lower copay with 90 day supply Requests that we resend Rx of Spiriva for 90 day supply. Nothing further needed.

## 2014-01-11 ENCOUNTER — Other Ambulatory Visit: Payer: Self-pay | Admitting: *Deleted

## 2014-01-11 DIAGNOSIS — J309 Allergic rhinitis, unspecified: Secondary | ICD-10-CM

## 2014-01-11 MED ORDER — FLUTICASONE PROPIONATE 50 MCG/ACT NA SUSP: 2.0000 | Freq: Two times a day (BID) | NASAL | Status: DC

## 2014-02-18 ENCOUNTER — Other Ambulatory Visit (INDEPENDENT_AMBULATORY_CARE_PROVIDER_SITE_OTHER): Payer: Self-pay

## 2014-02-18 ENCOUNTER — Other Ambulatory Visit (INDEPENDENT_AMBULATORY_CARE_PROVIDER_SITE_OTHER): Payer: Self-pay | Admitting: Surgery

## 2014-02-18 DIAGNOSIS — Z01818 Encounter for other preprocedural examination: Secondary | ICD-10-CM

## 2014-03-02 ENCOUNTER — Ambulatory Visit (INDEPENDENT_AMBULATORY_CARE_PROVIDER_SITE_OTHER): Payer: BLUE CROSS/BLUE SHIELD | Admitting: Cardiology

## 2014-03-02 ENCOUNTER — Encounter: Payer: Self-pay | Admitting: Cardiology

## 2014-03-02 VITALS — BP 142/88 | HR 80 | Ht 67.5 in | Wt >= 6400 oz

## 2014-03-02 DIAGNOSIS — I10 Essential (primary) hypertension: Secondary | ICD-10-CM

## 2014-03-02 DIAGNOSIS — I50810 Right heart failure, unspecified: Secondary | ICD-10-CM

## 2014-03-02 DIAGNOSIS — I5032 Chronic diastolic (congestive) heart failure: Secondary | ICD-10-CM

## 2014-03-02 DIAGNOSIS — I509 Heart failure, unspecified: Secondary | ICD-10-CM

## 2014-03-02 MED ORDER — TORSEMIDE 20 MG PO TABS: 20.0000 mg | ORAL_TABLET | Freq: Two times a day (BID) | ORAL | Status: DC

## 2014-03-02 NOTE — Patient Instructions (Addendum)
Your physician wants you to follow-up in: 6 months with Dr Bryna Colander will receive a reminder letter in the mail two months in advance. If you don't receive a letter, please call our office to schedule the follow-up appointment.    Your physician has recommended you make the following change in your medication:   STOP Lasix  START Torsemide 20 mg twice a day  In 2 weeks,get lab work Theodoro Doing)     Thank you for choosing [facility] !

## 2014-03-02 NOTE — Progress Notes (Signed)
Clinical Summary [name] is a 54 y.o.female seen today for follow up of the following medical problems.   1. Chronic diastolic heart failure  - weight is up since last visit, 369-->396. She reports poor eating habits. Does not some increased LE edema but feels weight gain more related to calorie intake.   - compliant with diuretic, mixed compliance with low sodium  2. Right sided heart failure  - noted during previous admit with respiratory failure, initial echo showed severely enlarged RV with D-shaped septum with preserved function. Repeat echoes as pulmonary status and fluid status improved showed normal sized RV and function.  - notes some LE edema, stable SOB  3. OSA  - compliant with CPAP machine.   4. HTN - compliant with meds - checks at home daily, typically 120-130/  90s  5. Obesity - considering gastric bypass  Past Medical History  Diagnosis Date  . Hypertension   . GERD (gastroesophageal reflux disease)   . Sleep apnea   . Heart failure   . COPD (chronic obstructive pulmonary disease)   . Hypothyroid   . CHF (congestive heart failure)      Allergies  Allergen Reactions  . Augmentin [Amoxicillin-Pot Clavulanate] Other (See Comments)    thrush     Current Outpatient Prescriptions  Medication Sig Dispense Refill  . albuterol (PROVENTIL HFA;VENTOLIN HFA) 108 (90 BASE) MCG/ACT inhaler Inhale 2 puffs into the lungs every 6 (six) hours as needed for wheezing or shortness of breath. 1 Inhaler 6  . aspirin 81 MG chewable tablet Chew 1 tablet (81 mg total) by mouth daily.    . Biotin 5000 MCG TABS Take 1 tablet by mouth daily.    Marland Kitchen buPROPion (WELLBUTRIN XL) 150 MG 24 hr tablet Take 1 tablet by mouth daily.    . cefdinir (OMNICEF) 300 MG capsule Take 1 capsule (300 mg total) by mouth 2 (two) times daily. 14 capsule 0  . cetirizine (ZYRTEC) 10 MG tablet Take 10 mg by mouth daily.    . Cholecalciferol (VITAMIN D3) 2000 UNITS TABS Take by mouth daily.      . Cyanocobalamin (VITAMIN B-12 CR PO) Take 1 tablet by mouth daily.    Marland Kitchen diltiazem (DILACOR XR) 180 MG 24 hr capsule Take 1 capsule (180 mg total) by mouth daily. 90 capsule 3  . esomeprazole (NEXIUM) 40 MG capsule Take by mouth daily.    . fluticasone (FLONASE) 50 MCG/ACT nasal spray Place 2 sprays into both nostrils 2 (two) times daily. 48 g 1  . furosemide (LASIX) 40 MG tablet Take 40 mg by mouth 2 (two) times daily.    Marland Kitchen ibuprofen (ADVIL,MOTRIN) 200 MG tablet Take 200 mg by mouth every 6 (six) hours as needed.    Marland Kitchen levothyroxine (SYNTHROID, LEVOTHROID) 50 MCG tablet Take 75 mcg by mouth.     . lidocaine (LIDODERM) 5 % Place onto the skin daily.    Marland Kitchen losartan (COZAAR) 100 MG tablet Place 1 tablet (100 mg total) into feeding tube daily.    . methocarbamol (ROBAXIN) 500 MG tablet Take 500 mg by mouth 4 (four) times daily.    . Multiple Vitamins-Minerals (MULTIVITAMIN WITH MINERALS) tablet Take 1 tablet by mouth daily.    . predniSONE (DELTASONE) 10 MG tablet 4 tabs for 2 days, then 3 tabs for 2 days, 2 tabs for 2 days, then 1 tab for 2 days, then stop 20 tablet 0  . SPIRIVA HANDIHALER 18 MCG inhalation capsule PLACE 1  CAPSULE (18 MCG TOTAL) INTO INHALER AND INHALE DAILY. 30 capsule 2  . tiotropium (SPIRIVA) 18 MCG inhalation capsule Place 1 capsule (18 mcg total) into inhaler and inhale daily. 90 capsule 2  . traMADol (ULTRAM) 50 MG tablet Take 50 mg by mouth every 6 (six) hours as needed.    . traZODone (DESYREL) 50 MG tablet Take 50 mg by mouth at bedtime.     No current facility-administered medications for this visit.     Past Surgical History  Procedure Laterality Date  . Achilles tendon surgery    . Back surgery      x4  . Tonsilectomy, adenoidectomy, bilateral myringotomy and tubes    . Tracheostomy      11/2012  . Tracheostomy closure      11/2012  . Colonoscopy N/A 05/24/2013    Procedure: COLONOSCOPY;  Surgeon: Daneil Dolin, MD;  Location: [facility];  Service:  Endoscopy;  Laterality: N/A;  3:00 PM     Allergies  Allergen Reactions  . Augmentin [Amoxicillin-Pot Clavulanate] Other (See Comments)    thrush      Family History  Problem Relation Age of Onset  . Diabetes Sister   . Emphysema Father   . Asthma Sister   . Heart disease Father   . Heart disease Mother   . Cancer Father     lung  . Colon cancer Neg Hx      Social History [name] reports that she quit smoking about 15 months ago. Her smoking use included Cigarettes. She has a 80 pack-year smoking history. She has never used smokeless tobacco. [name] reports that she does not drink alcohol.   Review of Systems CONSTITUTIONAL: No weight loss, fever, chills, weakness or fatigue.  HEENT: Eyes: No visual loss, blurred vision, double vision or yellow sclerae.No hearing loss, sneezing, congestion, runny nose or sore throat.  SKIN: No rash or itching.  CARDIOVASCULAR: per HPI RESPIRATORY: No shortness of breath, cough or sputum.  GASTROINTESTINAL: No anorexia, nausea, vomiting or diarrhea. No abdominal pain or blood.  GENITOURINARY: No burning on urination, no polyuria NEUROLOGICAL: No headache, dizziness, syncope, paralysis, ataxia, numbness or tingling in the extremities. No change in bowel or bladder control.  MUSCULOSKELETAL: No muscle, back pain, joint pain or stiffness.  LYMPHATICS: No enlarged nodes. No history of splenectomy.  PSYCHIATRIC: No history of depression or anxiety.  ENDOCRINOLOGIC: No reports of sweating, cold or heat intolerance. No polyuria or polydipsia.  Marland Kitchen   Physical Examination p 80 bp 142/88 Wt 401 lbs BMI 62 Gen: resting comfortably, no acute distress HEENT: no scleral icterus, pupils equal round and reactive, no palptable cervical adenopathy,  CV: RRR, no m/r/g, no JVD Resp: Clear to auscultation bilaterally GI: abdomen is soft, non-tender, non-distended, normal bowel sounds, no hepatosplenomegaly MSK: extremities are warm, 2+ bilateral  LE edema Skin: warm, no rash Neuro:  no focal deficits Psych: appropriate affect   Diagnostic Studies 12/04/2012 Echo  Technically difficult study, LVEF 74-12%, grade I diastolic dysfunction, mild AS, cannot estimate PASP, normal IVC, normal RV   11/24/12 Echo  LVEF 55-60%, moderate RV dilatation with normal function   11/20/12 Echo  LVEF 65-70%, grade II diastolic dysfunction, flattened RV septum in diastole and systole, severe RV dilatatoin, PASP 64mmHg   02/2013 PFTs Obstruction and diffusion defect suggesting emphysema, however absence of hyperinflaction. Overall minimal obstructive disease.  06/2013 Exercise MPI Baseline tracing shows sinus rhythm at 91 beats per min with poor weight progression. Patient was  exercised by Bruce protocol for 2 min and 46 seconds achieving a maximum work load of 4.6 METS. Heart rate increased from 94 beats per min up to 146 beats per min which was 87% of the maximum age predicted heart rate. Blood pressure increased from 154/76 up to 200/72. No chest pain rule was reported, however exercise was limited by shortness of breath and fatigue. There were no clearly diagnostic ST segment abnormalities, and no arrhythmias were noted.  Analysis of the overall perfusion data finds somewhat heterogeneous radiotracer uptake with evidence of breast attenuation.  Tomographic views were obtained using the short axis, vertical long axis, and horizontal long axis planes. There is a small, mild intensity basal anterior defect that is fixed and consistent with soft tissue attenuation. There is also a small, moderate intensity inferior wall defect that is generally more prominent at rest than stress, overall fixed with partial reversibility in the mid inferoseptal and inferolateral segment. Suspect that this is related to variable diaphragmatic attenuation, however cannot completely exclude a mild degree of ischemia.  Gated imaging reveals an EDV of  109, ESV of 20, TID ratio 0.69, and LVEF of 75% with normal wall motion.  IMPRESSION: Low risk exercise Cardiolite as outlined. Patient had limited exercise tolerance due to shortness of breath and fatigue, achieved a maximum work load of 4.6 METS. There were no diagnostic ST segment abnormalities or arrhythmias. Perfusion imaging is most consistent with breast tissue attenuation as well as variable diaphragmatic attenuation, cannot completely exclude a mild degree of inferior ischemia as outlined. LVEF is vigorous at 75% with normal volumes and wall motion.  Assessment and Plan   1. Chronic Diastolic heart failure  - + LE edema, increased weight since last visit - she reports decreased uop with lasix. WIll change to toresmide 20mg  bid, check BMET and Mg in 2 weeks  2. Right sided heart failure - improved with diuresis during recent admission, continue management for diastolic heart failure and chronic resp issues  3. OSA - continue CPAP  4. HTN - at goal, continue current meds  5. Obesity - she is considering gastric bypass surgery - there are no cardiac contraindications to this surgery   F/u 6 months  Arnoldo Lenis, M.D.

## 2014-03-15 ENCOUNTER — Ambulatory Visit (HOSPITAL_COMMUNITY)
Admission: RE | Admit: 2014-03-15 | Discharge: 2014-03-15 | Disposition: A | Discharge status: Home / Self Care | Payer: BLUE CROSS/BLUE SHIELD | Source: Ambulatory Visit | Attending: Surgery | Admitting: Surgery

## 2014-03-15 ENCOUNTER — Other Ambulatory Visit: Payer: Self-pay

## 2014-03-15 DIAGNOSIS — Z13820 Encounter for screening for osteoporosis: Secondary | ICD-10-CM | POA: Diagnosis not present

## 2014-03-15 DIAGNOSIS — K219 Gastro-esophageal reflux disease without esophagitis: Secondary | ICD-10-CM | POA: Diagnosis not present

## 2014-03-15 DIAGNOSIS — Z6841 Body Mass Index (BMI) 40.0 and over, adult: Secondary | ICD-10-CM | POA: Insufficient documentation

## 2014-03-15 DIAGNOSIS — K8020 Calculus of gallbladder without cholecystitis without obstruction: Secondary | ICD-10-CM | POA: Insufficient documentation

## 2014-03-15 DIAGNOSIS — K760 Fatty (change of) liver, not elsewhere classified: Secondary | ICD-10-CM | POA: Diagnosis not present

## 2014-03-15 DIAGNOSIS — R0602 Shortness of breath: Secondary | ICD-10-CM | POA: Diagnosis not present

## 2014-03-15 DIAGNOSIS — R918 Other nonspecific abnormal finding of lung field: Secondary | ICD-10-CM | POA: Diagnosis not present

## 2014-03-17 LAB — BASIC METABOLIC PANEL
BUN: 17 mg/dL (ref 6–23)
CALCIUM: 9 mg/dL (ref 8.4–10.5)
CO2: 29 mEq/L (ref 19–32)
Chloride: 100 mEq/L (ref 96–112)
Creat: 0.88 mg/dL (ref 0.50–1.10)
GLUCOSE: 111 mg/dL — AB (ref 70–99)
POTASSIUM: 4.1 meq/L (ref 3.5–5.3)
SODIUM: 142 meq/L (ref 135–145)

## 2014-03-17 LAB — MAGNESIUM: Magnesium: 1.8 mg/dL (ref 1.5–2.5)

## 2014-03-22 ENCOUNTER — Encounter: Payer: BLUE CROSS/BLUE SHIELD | Attending: Surgery | Admitting: Dietician

## 2014-03-22 DIAGNOSIS — Z713 Dietary counseling and surveillance: Secondary | ICD-10-CM | POA: Insufficient documentation

## 2014-03-22 DIAGNOSIS — Z6841 Body Mass Index (BMI) 40.0 and over, adult: Secondary | ICD-10-CM | POA: Insufficient documentation

## 2014-03-22 NOTE — Progress Notes (Signed)
  Pre-Op Assessment Visit:  Pre-Operative RYGB Surgery  Medical Nutrition Therapy:  Appt start time: 8421   End time:  1130.  Patient was seen on 03/22/2014 for Pre-Operative Nutrition Assessment. Assessment and letter of approval faxed to [facility] Bariatric Surgery Program coordinator on 03/22/2014.   Preferred Learning Style:   No preference indicated   Learning Readiness:   Ready  Handouts given during visit include:  Pre-Op Goals Bariatric Surgery Protein Shakes   During the appointment today the following Pre-Op Goals were reviewed with the patient: Maintain or lose weight as instructed by your surgeon Make healthy food choices Begin to limit portion sizes Limited concentrated sugars and fried foods Keep fat/sugar in the single digits per serving on   food labels Practice CHEWING your food  (aim for 30 chews per bite or until applesauce consistency) Practice not drinking 15 minutes before, during, and 30 minutes after each meal/snack Avoid all carbonated beverages  Avoid/limit caffeinated beverages  Avoid all sugar-sweetened beverages Consume 3 meals per day; eat every 3-5 hours Make a list of non-food related activities Aim for 64-100 ounces of FLUID daily  Aim for at least 60-80 grams of PROTEIN daily Look for a liquid protein source that contain ?15 g protein and ?5 g carbohydrate  (ex: shakes, drinks, shots)  Patient-Centered Goals: -Walking without pain -Looking good dancing -Swimming -Personal assistant -Keeping grandson alone -Getting in and out of a boat -Tight jeans  Scale of 1-10: confidence (10) /importance scale (12)  Demonstrated degree of understanding via:  Teach Back  Teaching Method Utilized:  Visual Auditory Hands on  Barriers to learning/adherence to lifestyle change: none  [facility] to call the [facility] to enroll in Pre-Op and Post-Op Nutrition Education when surgery date is scheduled.

## 2014-03-23 ENCOUNTER — Ambulatory Visit (HOSPITAL_COMMUNITY): Admission: RE | Admit: 2014-03-23 | Payer: Self-pay | Source: Ambulatory Visit | Admitting: Surgery

## 2014-03-23 ENCOUNTER — Encounter (HOSPITAL_COMMUNITY): Admission: RE | Payer: Self-pay | Source: Ambulatory Visit

## 2014-03-23 SURGERY — BREATH TEST, FOR HELICOBACTER PYLORI

## 2014-06-06 ENCOUNTER — Encounter: Payer: BLUE CROSS/BLUE SHIELD | Attending: Surgery

## 2014-06-06 DIAGNOSIS — Z713 Dietary counseling and surveillance: Secondary | ICD-10-CM | POA: Insufficient documentation

## 2014-06-06 DIAGNOSIS — Z6841 Body Mass Index (BMI) 40.0 and over, adult: Secondary | ICD-10-CM | POA: Insufficient documentation

## 2014-06-06 NOTE — Progress Notes (Signed)
  Pre-Operative Nutrition Class:  Appt start time: 830   End time:  930.  Patient was seen on 06/06/14 for Pre-Operative Bariatric Surgery Education at the [facility].   Surgery date:   Surgery type: RYGB Start weight at [facility]: 395 lbs on 03/22/14 Weight today: 406.5 lbs  TANITA  BODY COMP RESULTS  06/06/14   BMI (kg/m^2) 61.8   Fat Mass (lbs) 214.5   Fat Free Mass (lbs) 192   Total Body Water (lbs) 140.5   Samples given per MNT protocol. Patient educated on appropriate usage: Premier protein shake (strawberry - qty 1) Lot #: [id number] Exp: 09/2014  Unjury protein powder (chicken soup - qty 1) Lot #: [id number] Exp: 07/2015  Celebrate Vitamins Multivitamin chew (berry - qty 1) Lot #: [id number] Exp: 03/2016  PB2 (chocolate - qty 1) Lot #: none available Exp: 11/2014   The following the learning objectives were met by the patient during this course:  Identify Pre-Op Dietary Goals and will begin 2 weeks pre-operatively  Identify appropriate sources of fluids and proteins   State protein recommendations and appropriate sources pre and post-operatively  Identify Post-Operative Dietary Goals and will follow for 2 weeks post-operatively  Identify appropriate multivitamin and calcium sources  Describe the need for physical activity post-operatively and will follow MD recommendations  State when to call healthcare provider regarding medication questions or post-operative complications  Handouts given during class include:  Pre-Op Bariatric Surgery Diet Handout  Protein Shake Handout  Post-Op Bariatric Surgery Nutrition Handout  BELT Program Information Flyer  Support Group Information Flyer  [facility] Bariatric Supplements Price List  Follow-Up Plan: Patient will follow-up at [facility] 2 weeks post operatively for diet advancement per MD.

## 2014-06-14 NOTE — Progress Notes (Signed)
Please put orders in Epic surgery [id number] pre op 06-22-14 Thanks

## 2014-06-15 ENCOUNTER — Ambulatory Visit: Payer: Self-pay | Admitting: Surgery

## 2014-06-15 NOTE — H&P (Signed)
Chief Complaint:  Super morbid obesity with BMI 62  History of Present Illness:  Meghan Hoover is an 54 y.o. female who has never had abdominal surgery and has a past [place]ian history having had a trach and requiring ventilation.  The trach was removed in 2014 after a stay at [facility] and [place].  Her weight today is 405.  I have explained that we will try to do her roux Y but will have the sleeve gastrectomy as a fall back in case we need it.  The issue can be if there is adequate small bowel mobility to get it up to the pouch.  She does have GER.  UGI showed GER but no hiatal hernia.  She has multiple layering gallstones.  Informed consent has been obtained about both of these operations.    Past Medical History  Diagnosis Date  . Hypertension   . GERD (gastroesophageal reflux disease)   . Sleep apnea   . Heart failure   . COPD (chronic obstructive pulmonary disease)   . Hypothyroid   . CHF (congestive heart failure)     Past Surgical History  Procedure Laterality Date  . Achilles tendon surgery    . Back surgery      x4  . Tonsilectomy, adenoidectomy, bilateral myringotomy and tubes    . Tracheostomy      11/2012  . Tracheostomy closure      11/2012  . Colonoscopy N/A 05/24/2013    Procedure: COLONOSCOPY;  Surgeon: Daneil Dolin, MD;  Location: [facility] SUITE;  Service: Endoscopy;  Laterality: N/A;  3:00 PM    Current Outpatient Prescriptions  Medication Sig Dispense Refill  . albuterol (PROVENTIL HFA;VENTOLIN HFA) 108 (90 BASE) MCG/ACT inhaler Inhale 2 puffs into the lungs every 6 (six) hours as needed for wheezing or shortness of breath. 1 Inhaler 6  . aspirin 81 MG chewable tablet Chew 1 tablet (81 mg total) by mouth daily.    . Biotin 5000 MCG TABS Take 1 tablet by mouth daily.    Marland Kitchen buPROPion (WELLBUTRIN XL) 150 MG 24 hr tablet Take 1 tablet by mouth daily.    . cetirizine (ZYRTEC) 10 MG tablet Take 10 mg by mouth daily.    . Cholecalciferol (VITAMIN D3) 2000 UNITS  TABS Take by mouth daily.    . Cyanocobalamin (VITAMIN B-12 CR PO) Take 1 tablet by mouth daily.    Marland Kitchen diltiazem (DILACOR XR) 180 MG 24 hr capsule Take 1 capsule (180 mg total) by mouth daily. 90 capsule 3  . esomeprazole (NEXIUM) 40 MG capsule Take by mouth daily.    . fluticasone (FLONASE) 50 MCG/ACT nasal spray Place 2 sprays into both nostrils 2 (two) times daily. 48 g 1  . ibuprofen (ADVIL,MOTRIN) 200 MG tablet Take 200 mg by mouth every 6 (six) hours as needed.    Marland Kitchen levothyroxine (SYNTHROID, LEVOTHROID) 50 MCG tablet Take 75 mcg by mouth.     . lidocaine (LIDODERM) 5 % Place onto the skin daily.    Marland Kitchen losartan (COZAAR) 100 MG tablet Place 1 tablet (100 mg total) into feeding tube daily.    . methocarbamol (ROBAXIN) 500 MG tablet Take 500 mg by mouth 4 (four) times daily.    . Multiple Vitamins-Minerals (MULTIVITAMIN WITH MINERALS) tablet Take 1 tablet by mouth daily.    Marland Kitchen SPIRIVA HANDIHALER 18 MCG inhalation capsule PLACE 1 CAPSULE (18 MCG TOTAL) INTO INHALER AND INHALE DAILY. 30 capsule 2  . tiotropium (SPIRIVA) 18 MCG inhalation  capsule Place 1 capsule (18 mcg total) into inhaler and inhale daily. 90 capsule 2  . torsemide (DEMADEX) 20 MG tablet Take 1 tablet (20 mg total) by mouth 2 (two) times daily. 180 tablet 3  . traMADol (ULTRAM) 50 MG tablet Take 50 mg by mouth every 6 (six) hours as needed.    . traZODone (DESYREL) 50 MG tablet Take 50 mg by mouth at bedtime.     No current facility-administered medications for this visit.   Augmentin Family History  Problem Relation Age of Onset  . Diabetes Sister   . Emphysema Father   . Asthma Sister   . Heart disease Father   . Heart disease Mother   . Cancer Father     lung  . Colon cancer Neg Hx    Social History:   reports that she quit smoking about 18 months ago. Her smoking use included Cigarettes. She started smoking about 43 years ago. She has a 80 pack-year smoking history. She has never used smokeless tobacco. She reports  that she does not drink alcohol or use illicit drugs.   REVIEW OF SYSTEMS : Negative except for see problem list  Physical Exam:   There were no vitals taken for this visit. There is no weight on file to calculate BMI.  Gen:  WDWN WF NAD  Neurological: Alert and oriented to person, place, and time. Motor and sensory function is grossly intact  Head: Normocephalic and atraumatic.  Eyes: Conjunctivae are normal. Pupils are equal, round, and reactive to light. No scleral icterus.  Neck: Normal range of motion. Neck supple. No tracheal deviation or thyromegaly present.  Cardiovascular:  SR without murmurs or gallops.  No carotid bruits Breast:  Not examined Respiratory: Effort normal.  No respiratory distress. No chest wall tenderness. Breath sounds normal.  No wheezes, rales or rhonchi.  Abdomen:  Large without scars GU:  Not examined Musculoskeletal: Normal range of motion. Extremities are nontender. No cyanosis, edema or clubbing noted Lymphadenopathy: No cervical, preauricular, postauricular or axillary adenopathy is present Skin: Skin is warm and dry. No rash noted. No diaphoresis. No erythema. No pallor. Pscyh: Normal mood and affect. Behavior is normal. Judgment and thought content normal.   LABORATORY RESULTS: No results found for this or any previous visit (from the past 48 hour(s)).   RADIOLOGY RESULTS: No results found.  Problem List: Patient Active Problem List   Diagnosis Date Noted  . Morbid obesity 08/24/2013  . Encounter for screening colonoscopy 05/20/2013  . COPD (chronic obstructive pulmonary disease) 04/30/2013  . Allergic rhinitis 04/30/2013  . 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 CAP (community acquired pneumonia) 11/21/2012  . Sepsis 11/21/2012  . Acute diastolic congestive heart failure 11/20/2012  . Acute respiratory failure with hypoxia 11/19/2012  . Volume overload 11/19/2012  . Bilateral lower extremity edema 11/19/2012  . HTN (hypertension)  11/19/2012  . Normocytic anemia 11/19/2012  . Tobacco dependence 11/19/2012  . ACHILLES TENDINITIS 03/23/2008    Assessment & Plan: supermorbid obesity for lap roux en Y or sleeve if necessary.      Matt B. Hassell Done, MD, [facility] [phone] beeper [phone]  06/15/2014 3:49 PM

## 2014-06-22 ENCOUNTER — Encounter (HOSPITAL_COMMUNITY): Payer: Self-pay

## 2014-06-22 ENCOUNTER — Ambulatory Visit (HOSPITAL_COMMUNITY)
Admission: RE | Admit: 2014-06-22 | Discharge: 2014-06-22 | Disposition: A | Discharge status: Home / Self Care | Payer: BLUE CROSS/BLUE SHIELD | Source: Ambulatory Visit | Attending: Anesthesiology | Admitting: Anesthesiology

## 2014-06-22 ENCOUNTER — Encounter (HOSPITAL_COMMUNITY)
Admission: RE | Admit: 2014-06-22 | Discharge: 2014-06-22 | Disposition: A | Discharge status: Home / Self Care | Payer: BLUE CROSS/BLUE SHIELD | Source: Ambulatory Visit | Attending: Surgery | Admitting: Surgery

## 2014-06-22 DIAGNOSIS — Z87891 Personal history of nicotine dependence: Secondary | ICD-10-CM | POA: Insufficient documentation

## 2014-06-22 DIAGNOSIS — R9389 Abnormal findings on diagnostic imaging of other specified body structures: Secondary | ICD-10-CM

## 2014-06-22 DIAGNOSIS — I10 Essential (primary) hypertension: Secondary | ICD-10-CM | POA: Diagnosis not present

## 2014-06-22 DIAGNOSIS — Z01818 Encounter for other preprocedural examination: Secondary | ICD-10-CM | POA: Insufficient documentation

## 2014-06-22 HISTORY — DX: Major depressive disorder, single episode, unspecified: F32.9

## 2014-06-22 HISTORY — DX: Pneumonia, unspecified organism: J18.9

## 2014-06-22 HISTORY — DX: Unspecified urinary incontinence: R32

## 2014-06-22 HISTORY — DX: Bronchitis, not specified as acute or chronic: J40

## 2014-06-22 HISTORY — DX: Anesthesia of skin: R20.0

## 2014-06-22 HISTORY — DX: Atherosclerotic heart disease of native coronary artery without angina pectoris: I25.10

## 2014-06-22 HISTORY — DX: Personal history of urinary (tract) infections: Z87.440

## 2014-06-22 HISTORY — DX: Localized edema: R60.0

## 2014-06-22 HISTORY — DX: Dysphagia, unspecified: R13.10

## 2014-06-22 HISTORY — DX: Depression, unspecified: F32.A

## 2014-06-22 HISTORY — DX: Reserved for inherently not codable concepts without codable children: IMO0001

## 2014-06-22 HISTORY — DX: Anxiety disorder, unspecified: F41.9

## 2014-06-22 LAB — CBC
HCT: 41.5 % (ref 36.0–46.0)
HEMOGLOBIN: 13.5 g/dL (ref 12.0–15.0)
MCH: 30.3 pg (ref 26.0–34.0)
MCHC: 32.5 g/dL (ref 30.0–36.0)
MCV: 93 fL (ref 78.0–100.0)
Platelets: 271 10*3/uL (ref 150–400)
RBC: 4.46 MIL/uL (ref 3.87–5.11)
RDW: 15.7 % — AB (ref 11.5–15.5)
WBC: 9.6 10*3/uL (ref 4.0–10.5)

## 2014-06-22 LAB — BASIC METABOLIC PANEL
Anion gap: 14 (ref 5–15)
BUN: 19 mg/dL (ref 6–20)
CO2: 28 mmol/L (ref 22–32)
Calcium: 10.4 mg/dL — ABNORMAL HIGH (ref 8.9–10.3)
Chloride: 98 mmol/L — ABNORMAL LOW (ref 101–111)
Creatinine, Ser: 0.88 mg/dL (ref 0.44–1.00)
GFR calc Af Amer: >60 mL/min (ref >60)
GFR calc non Af Amer: >60 mL/min (ref >60)
Glucose, Bld: 110 mg/dL — ABNORMAL HIGH (ref 65–99)
Potassium: 4 mmol/L (ref 3.5–5.1)
SODIUM: 140 mmol/L (ref 135–145)

## 2014-06-22 NOTE — Patient Instructions (Addendum)
XIN KLAWITTER  06/22/2014   Your procedure is scheduled on: Monday June 27, 2014   Report to [facility] Main  Entrance and follow signs to               [facility] arrive at 8:45 AM.  Call this number if you have problems the morning of surgery [phone]   Remember: ONLY 1 PERSON MAY GO WITH YOU TO SHORT STAY TO GET  READY MORNING OF [place].  Do not eat food or drink liquids :After Midnight.                FLEETS ENEMA NIGHT BEFORE SURGERY.   Take these medicines the morning of surgery with A SIP OF WATER: Bupropion (Wellbutrin); Esomeprazole (Nexium); Claritin if needed; Flonase if needed (bring day of surgery); Albuterol Inhaler if needed (bring day of surgery); Levothyroxine; eye drops if needed (bring day of surgery); Sprivia (bring day of surgery.    DISCONTINUE ASPIRIN AND HERBAL MEDICATIONS 5 DAYS PRIOR TO SURGERY.                                You may not have any metal on your body including hair pins and              piercings  Do not wear jewelry, make-up, lotions, powders or perfumes, deodorant             Do not wear nail polish.  Do not shave  48 hours prior to surgery.              Men may shave face and neck.   Do not bring valuables to the hospital. [place].  Contacts, dentures or bridgework may not be worn into surgery.  Leave suitcase in the car. After surgery it may be brought to your room.     _____________________________________________________________________             [facility] - Preparing for Surgery Before surgery, you can play an important role.  Because skin is not sterile, your skin needs to be as free of germs as possible.  You can reduce the number of germs on your skin by washing with CHG (chlorahexidine gluconate) soap before surgery.  CHG is an antiseptic cleaner which kills germs and bonds with the skin to continue killing germs even after  washing. Please DO NOT use if you have an allergy to CHG or antibacterial soaps.  If your skin becomes reddened/irritated stop using the CHG and inform your nurse when you arrive at Short Stay. Do not shave (including legs and underarms) for at least 48 hours prior to the first CHG shower.  You may shave your face/neck. Please follow these instructions carefully:  1.  Shower with CHG Soap the night before surgery and the  morning of Surgery.  2.  If you choose to wash your hair, wash your hair first as usual with your  normal  shampoo.  3.  After you shampoo, rinse your hair and body thoroughly to remove the  shampoo.                           4.  Use CHG  as you would any other liquid soap.  You can apply chg directly  to the skin and wash                       Gently with a scrungie or clean washcloth.  5.  Apply the CHG Soap to your body ONLY FROM THE NECK DOWN.   Do not use on face/ open                           Wound or open sores. Avoid contact with eyes, ears mouth and genitals (private parts).                       Wash face,  Genitals (private parts) with your normal soap.             6.  Wash thoroughly, paying special attention to the area where your surgery  will be performed.  7.  Thoroughly rinse your body with warm water from the neck down.  8.  DO NOT shower/wash with your normal soap after using and rinsing off  the CHG Soap.                9.  Pat yourself dry with a clean towel.            10.  Wear clean pajamas.            11.  Place clean sheets on your bed the night of your first shower and do not  sleep with pets. Day of Surgery : Do not apply any lotions/deodorants the morning of surgery.  Please wear clean clothes to the hospital/surgery center.  FAILURE TO FOLLOW THESE INSTRUCTIONS MAY RESULT IN THE CANCELLATION OF YOUR SURGERY PATIENT SIGNATURE_________________________________  NURSE  SIGNATURE__________________________________  ________________________________________________________________________

## 2014-06-22 NOTE — Progress Notes (Signed)
Pt aware to bring mask and tubing with CPAP day of surgery.

## 2014-06-22 NOTE — Progress Notes (Signed)
Spoke with Dr Jerold Coombe in regards to H&P. Anesthesia reviewed. Anesthesia will see pt day of surgery.  EKG epic 03/15/2014 ECHO 12/04/2012 Stress Test epic 06/28/2013 OV note epic with clearance per Dr Branch/cardiology 03/02/2014 (discusses Cardiolite)

## 2014-06-26 ENCOUNTER — Ambulatory Visit: Payer: Self-pay | Admitting: Surgery

## 2014-06-27 ENCOUNTER — Encounter (HOSPITAL_COMMUNITY): Payer: Self-pay | Admitting: *Deleted

## 2014-06-27 ENCOUNTER — Encounter (HOSPITAL_COMMUNITY)
Admission: RE | Disposition: A | Discharge status: Home / Self Care | Payer: Self-pay | Source: Ambulatory Visit | Attending: Surgery

## 2014-06-27 ENCOUNTER — Inpatient Hospital Stay (HOSPITAL_COMMUNITY): Payer: BLUE CROSS/BLUE SHIELD | Admitting: Certified Registered Nurse Anesthetist

## 2014-06-27 ENCOUNTER — Inpatient Hospital Stay (HOSPITAL_COMMUNITY)
Admission: RE | Admit: 2014-06-27 | Discharge: 2014-06-29 | DRG: 621 | Disposition: A | Discharge status: Home / Self Care | Payer: BLUE CROSS/BLUE SHIELD | Source: Ambulatory Visit | Attending: Surgery | Admitting: Surgery

## 2014-06-27 DIAGNOSIS — G4730 Sleep apnea, unspecified: Secondary | ICD-10-CM | POA: Diagnosis present

## 2014-06-27 DIAGNOSIS — I10 Essential (primary) hypertension: Secondary | ICD-10-CM | POA: Diagnosis present

## 2014-06-27 DIAGNOSIS — E039 Hypothyroidism, unspecified: Secondary | ICD-10-CM | POA: Diagnosis present

## 2014-06-27 DIAGNOSIS — Z8249 Family history of ischemic heart disease and other diseases of the circulatory system: Secondary | ICD-10-CM | POA: Diagnosis not present

## 2014-06-27 DIAGNOSIS — J449 Chronic obstructive pulmonary disease, unspecified: Secondary | ICD-10-CM | POA: Diagnosis present

## 2014-06-27 DIAGNOSIS — Z9884 Bariatric surgery status: Secondary | ICD-10-CM

## 2014-06-27 DIAGNOSIS — Z79899 Other long term (current) drug therapy: Secondary | ICD-10-CM | POA: Diagnosis not present

## 2014-06-27 DIAGNOSIS — Z825 Family history of asthma and other chronic lower respiratory diseases: Secondary | ICD-10-CM | POA: Diagnosis not present

## 2014-06-27 DIAGNOSIS — Z791 Long term (current) use of non-steroidal anti-inflammatories (NSAID): Secondary | ICD-10-CM

## 2014-06-27 DIAGNOSIS — I509 Heart failure, unspecified: Secondary | ICD-10-CM | POA: Diagnosis present

## 2014-06-27 DIAGNOSIS — K219 Gastro-esophageal reflux disease without esophagitis: Secondary | ICD-10-CM | POA: Diagnosis present

## 2014-06-27 DIAGNOSIS — Z7982 Long term (current) use of aspirin: Secondary | ICD-10-CM

## 2014-06-27 DIAGNOSIS — Z809 Family history of malignant neoplasm, unspecified: Secondary | ICD-10-CM | POA: Diagnosis not present

## 2014-06-27 DIAGNOSIS — K8020 Calculus of gallbladder without cholecystitis without obstruction: Secondary | ICD-10-CM | POA: Diagnosis present

## 2014-06-27 DIAGNOSIS — Z6841 Body Mass Index (BMI) 40.0 and over, adult: Secondary | ICD-10-CM

## 2014-06-27 DIAGNOSIS — Z79891 Long term (current) use of opiate analgesic: Secondary | ICD-10-CM

## 2014-06-27 DIAGNOSIS — Z833 Family history of diabetes mellitus: Secondary | ICD-10-CM

## 2014-06-27 DIAGNOSIS — Z87891 Personal history of nicotine dependence: Secondary | ICD-10-CM

## 2014-06-27 HISTORY — PX: UPPER GI ENDOSCOPY: SHX6162

## 2014-06-27 HISTORY — PX: LAPAROSCOPIC ROUX-EN-Y GASTRIC BYPASS WITH UPPER ENDOSCOPY AND REMOVAL OF LAP BAND: SHX6505

## 2014-06-27 LAB — CBC
HCT: 39.4 % (ref 36.0–46.0)
HEMOGLOBIN: 12.7 g/dL (ref 12.0–15.0)
MCH: 29.5 pg (ref 26.0–34.0)
MCHC: 32.2 g/dL (ref 30.0–36.0)
MCV: 91.6 fL (ref 78.0–100.0)
PLATELETS: 237 10*3/uL (ref 150–400)
RBC: 4.3 MIL/uL (ref 3.87–5.11)
RDW: 15.5 % (ref 11.5–15.5)
WBC: 15.9 10*3/uL — ABNORMAL HIGH (ref 4.0–10.5)

## 2014-06-27 LAB — HEMOGLOBIN AND HEMATOCRIT, BLOOD
HCT: 39 % (ref 36.0–46.0)
Hemoglobin: 12.7 g/dL (ref 12.0–15.0)

## 2014-06-27 LAB — MRSA PCR SCREENING: MRSA BY PCR: NEGATIVE

## 2014-06-27 LAB — CREATININE, SERUM
Creatinine, Ser: 0.98 mg/dL (ref 0.44–1.00)
GFR calc Af Amer: >60 mL/min (ref >60)
GFR calc non Af Amer: >60 mL/min (ref >60)

## 2014-06-27 SURGERY — LAPAROSCOPIC ROUX-EN-Y GASTRIC BYPASS WITH UPPER ENDOSCOPY AND REMOVAL OF LAP BAND
Anesthesia: General

## 2014-06-27 MED ORDER — LACTATED RINGERS IV SOLN: INTRAVENOUS | Status: DC

## 2014-06-27 MED ORDER — GLYCOPYRROLATE 0.2 MG/ML IJ SOLN
INTRAMUSCULAR | Status: DC | PRN
  Administered 2014-06-27: .6 mg via INTRAVENOUS

## 2014-06-27 MED ORDER — FLEET ENEMA 7-19 GM/118ML RE ENEM: 1.0000 | ENEMA | Freq: Once | RECTAL | Status: DC

## 2014-06-27 MED ORDER — UNJURY CHICKEN SOUP POWDER
2.0000 [oz_av] | Freq: Four times a day (QID) | ORAL | Status: DC
  Filled 2014-06-27 (×4): qty 27

## 2014-06-27 MED ORDER — LABETALOL HCL 5 MG/ML IV SOLN
INTRAVENOUS | Status: AC
  Filled 2014-06-27: qty 4

## 2014-06-27 MED ORDER — CISATRACURIUM BESYLATE 20 MG/10ML IV SOLN
INTRAVENOUS | Status: AC
  Filled 2014-06-27: qty 20

## 2014-06-27 MED ORDER — FENTANYL CITRATE (PF) 250 MCG/5ML IJ SOLN
INTRAMUSCULAR | Status: DC | PRN
  Administered 2014-06-27 (×8): 50 ug via INTRAVENOUS
  Administered 2014-06-27 (×3): 100 ug via INTRAVENOUS

## 2014-06-27 MED ORDER — PROMETHAZINE HCL 25 MG/ML IJ SOLN
6.2500 mg | INTRAMUSCULAR | Status: DC | PRN
  Administered 2014-06-27: 6.25 mg via INTRAVENOUS

## 2014-06-27 MED ORDER — OXYCODONE HCL 5 MG/5ML PO SOLN
5.0000 mg | ORAL | Status: DC | PRN
  Administered 2014-06-28 – 2014-06-29 (×4): 5 mg via ORAL
  Filled 2014-06-27: qty 5
  Filled 2014-06-27 (×2): qty 25
  Filled 2014-06-27: qty 5

## 2014-06-27 MED ORDER — MIDAZOLAM HCL 2 MG/2ML IJ SOLN
INTRAMUSCULAR | Status: AC
  Filled 2014-06-27: qty 2

## 2014-06-27 MED ORDER — FENTANYL CITRATE (PF) 250 MCG/5ML IJ SOLN
INTRAMUSCULAR | Status: AC
  Filled 2014-06-27: qty 5

## 2014-06-27 MED ORDER — ONDANSETRON HCL 4 MG/2ML IJ SOLN
INTRAMUSCULAR | Status: DC | PRN
  Administered 2014-06-27: 4 mg via INTRAVENOUS

## 2014-06-27 MED ORDER — PROPOFOL 10 MG/ML IV BOLUS
INTRAVENOUS | Status: DC | PRN
  Administered 2014-06-27: 200 mg via INTRAVENOUS

## 2014-06-27 MED ORDER — NEOSTIGMINE METHYLSULFATE 10 MG/10ML IV SOLN
INTRAVENOUS | Status: AC
  Filled 2014-06-27: qty 1

## 2014-06-27 MED ORDER — CHLORHEXIDINE GLUCONATE 4 % EX LIQD: 1.0000 "application " | Freq: Once | CUTANEOUS | Status: DC

## 2014-06-27 MED ORDER — EVICEL 5 ML EX KIT
PACK | CUTANEOUS | Status: DC | PRN
  Administered 2014-06-27: 1

## 2014-06-27 MED ORDER — FENTANYL CITRATE (PF) 100 MCG/2ML IJ SOLN
INTRAMUSCULAR | Status: AC
  Filled 2014-06-27: qty 2

## 2014-06-27 MED ORDER — ONDANSETRON HCL 4 MG/2ML IJ SOLN
4.0000 mg | INTRAMUSCULAR | Status: DC | PRN
  Administered 2014-06-27 – 2014-06-29 (×5): 4 mg via INTRAVENOUS
  Filled 2014-06-27 (×5): qty 2

## 2014-06-27 MED ORDER — TISSEEL VH 10 ML EX KIT
PACK | CUTANEOUS | Status: AC
  Filled 2014-06-27: qty 1

## 2014-06-27 MED ORDER — SODIUM CHLORIDE 0.9 % IJ SOLN
INTRAMUSCULAR | Status: AC
  Filled 2014-06-27: qty 20

## 2014-06-27 MED ORDER — ONDANSETRON HCL 4 MG/2ML IJ SOLN
INTRAMUSCULAR | Status: AC
  Filled 2014-06-27: qty 2

## 2014-06-27 MED ORDER — ALBUTEROL SULFATE (2.5 MG/3ML) 0.083% IN NEBU: 3.0000 mL | INHALATION_SOLUTION | Freq: Four times a day (QID) | RESPIRATORY_TRACT | Status: DC | PRN

## 2014-06-27 MED ORDER — HEPARIN SODIUM (PORCINE) 5000 UNIT/ML IJ SOLN
5000.0000 [IU] | Freq: Three times a day (TID) | INTRAMUSCULAR | Status: DC
  Administered 2014-06-27 – 2014-06-29 (×5): 5000 [IU] via SUBCUTANEOUS
  Filled 2014-06-27 (×5): qty 1

## 2014-06-27 MED ORDER — ACETAMINOPHEN 160 MG/5ML PO SOLN: 325.0000 mg | ORAL | Status: DC | PRN

## 2014-06-27 MED ORDER — HEPARIN SODIUM (PORCINE) 5000 UNIT/ML IJ SOLN
5000.0000 [IU] | Freq: Once | INTRAMUSCULAR | Status: AC
  Administered 2014-06-27: 5000 [IU] via SUBCUTANEOUS
  Filled 2014-06-27: qty 1

## 2014-06-27 MED ORDER — BUPIVACAINE LIPOSOME 1.3 % IJ SUSP
20.0000 mL | Freq: Once | INTRAMUSCULAR | Status: DC
  Filled 2014-06-27: qty 20

## 2014-06-27 MED ORDER — CISATRACURIUM BESYLATE (PF) 10 MG/5ML IV SOLN
INTRAVENOUS | Status: DC | PRN
  Administered 2014-06-27: 10 mg via INTRAVENOUS
  Administered 2014-06-27: 6 mg via INTRAVENOUS
  Administered 2014-06-27 (×2): 4 mg via INTRAVENOUS
  Administered 2014-06-27: 10 mg via INTRAVENOUS

## 2014-06-27 MED ORDER — KCL IN DEXTROSE-NACL 20-5-0.45 MEQ/L-%-% IV SOLN
INTRAVENOUS | Status: DC
  Administered 2014-06-27 – 2014-06-29 (×4): via INTRAVENOUS
  Filled 2014-06-27 (×4): qty 1000

## 2014-06-27 MED ORDER — MEPERIDINE HCL 50 MG/ML IJ SOLN: 6.2500 mg | INTRAMUSCULAR | Status: DC | PRN

## 2014-06-27 MED ORDER — NEOSTIGMINE METHYLSULFATE 10 MG/10ML IV SOLN
INTRAVENOUS | Status: DC | PRN
  Administered 2014-06-27: 5 mg via INTRAVENOUS

## 2014-06-27 MED ORDER — FENTANYL CITRATE (PF) 100 MCG/2ML IJ SOLN
25.0000 ug | INTRAMUSCULAR | Status: DC | PRN
  Administered 2014-06-27 (×2): 50 ug via INTRAVENOUS

## 2014-06-27 MED ORDER — DEXTROSE 5 % IV SOLN
INTRAVENOUS | Status: AC
  Filled 2014-06-27: qty 2

## 2014-06-27 MED ORDER — GLYCOPYRROLATE 0.2 MG/ML IJ SOLN
INTRAMUSCULAR | Status: AC
  Filled 2014-06-27: qty 2

## 2014-06-27 MED ORDER — UNJURY VANILLA POWDER
2.0000 [oz_av] | Freq: Four times a day (QID) | ORAL | Status: DC
Start: 2014-06-29 — End: 2014-06-29
  Filled 2014-06-27 (×4): qty 27

## 2014-06-27 MED ORDER — ACETAMINOPHEN 160 MG/5ML PO SOLN: 650.0000 mg | ORAL | Status: DC | PRN

## 2014-06-27 MED ORDER — TIOTROPIUM BROMIDE MONOHYDRATE 18 MCG IN CAPS
18.0000 ug | ORAL_CAPSULE | Freq: Every day | RESPIRATORY_TRACT | Status: DC
  Administered 2014-06-28 – 2014-06-29 (×2): 18 ug via RESPIRATORY_TRACT
  Filled 2014-06-27: qty 5

## 2014-06-27 MED ORDER — SUCCINYLCHOLINE CHLORIDE 20 MG/ML IJ SOLN
INTRAMUSCULAR | Status: DC | PRN
  Administered 2014-06-27: 160 mg via INTRAVENOUS

## 2014-06-27 MED ORDER — LACTATED RINGERS IR SOLN
Status: DC | PRN
  Administered 2014-06-27: 1000 mL

## 2014-06-27 MED ORDER — LACTATED RINGERS IV SOLN
INTRAVENOUS | Status: DC | PRN
  Administered 2014-06-27 (×2): via INTRAVENOUS

## 2014-06-27 MED ORDER — LABETALOL HCL 5 MG/ML IV SOLN
INTRAVENOUS | Status: DC | PRN
  Administered 2014-06-27 (×4): 2.5 mg via INTRAVENOUS

## 2014-06-27 MED ORDER — DEXTROSE 5 % IV SOLN
2.0000 g | INTRAVENOUS | Status: AC
  Administered 2014-06-27 (×2): 2 g via INTRAVENOUS

## 2014-06-27 MED ORDER — PROPOFOL 10 MG/ML IV BOLUS
INTRAVENOUS | Status: AC
Start: 2014-06-27 — End: 2014-06-27
  Filled 2014-06-27: qty 20

## 2014-06-27 MED ORDER — PROMETHAZINE HCL 25 MG/ML IJ SOLN
INTRAMUSCULAR | Status: AC
  Filled 2014-06-27: qty 1

## 2014-06-27 MED ORDER — LIDOCAINE HCL (CARDIAC) 20 MG/ML IV SOLN
INTRAVENOUS | Status: AC
  Filled 2014-06-27: qty 5

## 2014-06-27 MED ORDER — UNJURY CHOCOLATE CLASSIC POWDER
2.0000 [oz_av] | Freq: Four times a day (QID) | ORAL | Status: DC
  Administered 2014-06-29: 2 [oz_av] via ORAL
  Filled 2014-06-27 (×4): qty 27

## 2014-06-27 MED ORDER — MORPHINE SULFATE 2 MG/ML IJ SOLN
2.0000 mg | INTRAMUSCULAR | Status: DC | PRN
  Administered 2014-06-27 – 2014-06-28 (×4): 4 mg via INTRAVENOUS
  Filled 2014-06-27 (×4): qty 2

## 2014-06-27 MED ORDER — LIDOCAINE HCL 1 % IJ SOLN
INTRAMUSCULAR | Status: DC | PRN
  Administered 2014-06-27: 80 mg via INTRADERMAL

## 2014-06-27 MED ORDER — MIDAZOLAM HCL 5 MG/5ML IJ SOLN
INTRAMUSCULAR | Status: DC | PRN
  Administered 2014-06-27: 2 mg via INTRAVENOUS

## 2014-06-27 MED ORDER — BUPIVACAINE LIPOSOME 1.3 % IJ SUSP
INTRAMUSCULAR | Status: DC | PRN
  Administered 2014-06-27: 20 mL

## 2014-06-27 SURGICAL SUPPLY — 72 items
APL SKNCLS STERI-STRIP NONHPOA (GAUZE/BANDAGES/DRESSINGS)
APL SRG 32X5 SNPLK LF DISP (MISCELLANEOUS) ×2
APPLICATOR COTTON TIP 6IN STRL (MISCELLANEOUS) ×6 IMPLANT
BENZOIN TINCTURE PRP APPL 2/3 (GAUZE/BANDAGES/DRESSINGS) IMPLANT
BLADE SURG 15 STRL LF DISP TIS (BLADE) ×2 IMPLANT
BLADE SURG 15 STRL SS (BLADE) ×4
CABLE HIGH FREQUENCY MONO STRZ (ELECTRODE) ×2 IMPLANT
CLIP SUT LAPRA TY ABSORB (SUTURE) ×4 IMPLANT
CLOSURE WOUND 1/2 X4 (GAUZE/BANDAGES/DRESSINGS)
DEVICE SUTURE ENDOST 10MM (ENDOMECHANICALS) ×4 IMPLANT
DISSECTOR BLUNT TIP ENDO 5MM (MISCELLANEOUS) IMPLANT
DRAIN PENROSE 18X1/4 LTX STRL (WOUND CARE) ×4 IMPLANT
DRAPE CAMERA CLOSED 9X96 (DRAPES) ×4 IMPLANT
GAUZE SPONGE 4X4 12PLY STRL (GAUZE/BANDAGES/DRESSINGS) ×4 IMPLANT
GAUZE SPONGE 4X4 16PLY XRAY LF (GAUZE/BANDAGES/DRESSINGS) ×4 IMPLANT
GLOVE BIOGEL M 8.0 STRL (GLOVE) ×4 IMPLANT
GOWN SPEC L4 XLG W/TWL (GOWN DISPOSABLE) ×2 IMPLANT
GOWN STRL REUS W/TWL XL LVL3 (GOWN DISPOSABLE) ×18 IMPLANT
HANDLE STAPLE EGIA 4 XL (STAPLE) ×4 IMPLANT
HOVERMATT SINGLE USE (MISCELLANEOUS) ×4 IMPLANT
KIT BASIN OR (CUSTOM PROCEDURE TRAY) ×4 IMPLANT
KIT GASTRIC LAVAGE 34FR ADT (SET/KITS/TRAYS/PACK) ×4 IMPLANT
NDL SPNL 22GX3.5 QUINCKE BK (NEEDLE) ×2 IMPLANT
NEEDLE SPNL 22GX3.5 QUINCKE BK (NEEDLE) ×4 IMPLANT
NS IRRIG 1000ML POUR BTL (IV SOLUTION) ×4 IMPLANT
PACK CARDIOVASCULAR III (CUSTOM PROCEDURE TRAY) ×4 IMPLANT
PEN SKIN MARKING BROAD (MISCELLANEOUS) ×4 IMPLANT
PORT ACCESS TROCAR AIRSEAL 12 (TROCAR) IMPLANT
PORT ACCESS TROCAR AIRSEAL 5M (TROCAR) ×2
RELOAD EGIA 45 MED/THCK PURPLE (STAPLE) ×4 IMPLANT
RELOAD EGIA 45 TAN VASC (STAPLE) ×4 IMPLANT
RELOAD EGIA 60 MED/THCK PURPLE (STAPLE) ×8 IMPLANT
RELOAD EGIA 60 TAN VASC (STAPLE) ×4 IMPLANT
RELOAD ENDO STITCH 2.0 (ENDOMECHANICALS) ×8
RELOAD STAPLE 60 MED/THCK ART (STAPLE) ×4 IMPLANT
RELOAD SUT SNGL STCH ABSRB 2-0 (ENDOMECHANICALS) ×10 IMPLANT
RELOAD SUT SNGL STCH BLK 2-0 (ENDOMECHANICALS) ×8 IMPLANT
RELOAD TRI 60 ART MED THCK PUR (STAPLE) ×4 IMPLANT
SCISSORS LAP 5X45 EPIX DISP (ENDOMECHANICALS) ×4 IMPLANT
SEALANT SURGICAL APPL DUAL CAN (MISCELLANEOUS) ×4 IMPLANT
SET IRRIG TUBING LAPAROSCOPIC (IRRIGATION / IRRIGATOR) ×4 IMPLANT
SET TRI-LUMEN FLTR TB AIRSEAL (TUBING) ×2 IMPLANT
SHEARS CURVED HARMONIC AC 45CM (MISCELLANEOUS) ×4 IMPLANT
SLEEVE ADV FIXATION 12X100MM (TROCAR) ×8 IMPLANT
SLEEVE ADV FIXATION 5X100MM (TROCAR) IMPLANT
SOLUTION ANTI FOG 6CC (MISCELLANEOUS) ×4 IMPLANT
STAPLER VISISTAT 35W (STAPLE) ×4 IMPLANT
STRIP CLOSURE SKIN 1/2X4 (GAUZE/BANDAGES/DRESSINGS) IMPLANT
STRIP PERI DRY VERITAS 45 (STAPLE) IMPLANT
STRIP PERI DRY VERITAS 60 (STAPLE) ×4 IMPLANT
SUT RELOAD ENDO STITCH 2 48X1 (ENDOMECHANICALS)
SUT RELOAD ENDO STITCH 2.0 (ENDOMECHANICALS) ×4
SUT VIC AB 2-0 SH 27 (SUTURE) ×4
SUT VIC AB 2-0 SH 27X BRD (SUTURE) ×2 IMPLANT
SUT VIC AB 4-0 SH 18 (SUTURE) ×4 IMPLANT
SUTURE RELOAD END STTCH 2 48X1 (ENDOMECHANICALS) IMPLANT
SUTURE RELOAD ENDO STITCH 2.0 (ENDOMECHANICALS) ×4 IMPLANT
SYR 20CC LL (SYRINGE) ×4 IMPLANT
SYR 30ML LL (SYRINGE) ×4 IMPLANT
SYR 50ML LL SCALE MARK (SYRINGE) ×4 IMPLANT
TAPE CLOTH SURG 4X10 WHT LF (GAUZE/BANDAGES/DRESSINGS) ×2 IMPLANT
TOWEL OR 17X26 10 PK STRL BLUE (TOWEL DISPOSABLE) ×8 IMPLANT
TOWEL OR NON WOVEN STRL DISP B (DISPOSABLE) ×4 IMPLANT
TRAY FOLEY W/METER SILVER 14FR (SET/KITS/TRAYS/PACK) ×4 IMPLANT
TROCAR ADV FIXATION 12X100MM (TROCAR) ×4 IMPLANT
TROCAR ADV FIXATION 5X100MM (TROCAR) ×4 IMPLANT
TROCAR BLADELESS OPT 5 100 (ENDOMECHANICALS) ×4 IMPLANT
TROCAR XCEL 12X100 BLDLESS (ENDOMECHANICALS) ×4 IMPLANT
TUBING CONNECTING 10 (TUBING) ×3 IMPLANT
TUBING CONNECTING 10' (TUBING) ×1
TUBING ENDO SMARTCAP PENTAX (MISCELLANEOUS) ×4 IMPLANT
TUBING FILTER THERMOFLATOR (ELECTROSURGICAL) ×4 IMPLANT

## 2014-06-27 NOTE — Op Note (Signed)
Upper GI endoscopy is performed at the completion of laparoscopic Roux-en-Y gastric bypass by Dr. Hassell Done. The Olympus video endoscope was inserted into the upper esophagus and then passed under direct vision to the EG junction. The small gastric pouch was insufflated with air while the gastric outlet was clamped under irrigation by the operating surgeon. There was no evidence of leak. The anastomosis was visualized and was patent. Suture and staple lines were intact and without bleeding. The pouch was tubular and measured 6-7 cm in length. At the completion of the procedure the pouch was desufflated and the scope withdrawn.

## 2014-06-27 NOTE — Anesthesia Procedure Notes (Signed)
Procedure Name: Intubation Performed by: Haydee Jabbour K Pre-anesthesia Checklist: Patient identified, Emergency Drugs available, Suction available, Patient being monitored and Timeout performed Patient Re-evaluated:Patient Re-evaluated prior to inductionOxygen Delivery Method: Circle system utilized Preoxygenation: Pre-oxygenation with 100% oxygen Intubation Type: IV induction Ventilation: Mask ventilation with difficulty Laryngoscope Size: Mac and 3 Grade View: Grade III Tube type: Oral Tube size: 7.5 mm Number of attempts: 1 Airway Equipment and Method: Stylet Placement Confirmation: positive ETCO2 and ETT inserted through vocal cords under direct vision Secured at: 21 cm Tube secured with: Tape Dental Injury: Teeth and Oropharynx as per pre-operative assessment  Difficulty Due To: Difficult Airway- due to limited oral opening, Difficult Airway-  due to edematous airway, Difficult Airway- due to reduced neck mobility, Difficult Airway- due to large tongue and Difficulty was anticipated

## 2014-06-27 NOTE — Anesthesia Preprocedure Evaluation (Addendum)
Anesthesia Evaluation  Patient identified by MRN, date of birth, ID band Patient awake    Reviewed: Allergy & Precautions, NPO status , Patient's Chart, lab work & pertinent test results  Airway Mallampati: II  TM Distance: >3 FB Neck ROM: Full    Dental no notable dental hx.    Pulmonary shortness of breath, sleep apnea and Continuous Positive Airway Pressure Ventilation , COPDformer smoker,  Previous tracheostomy breath sounds clear to auscultation  Pulmonary exam normal       Cardiovascular hypertension, + CAD and +CHF Normal cardiovascular examRhythm:Regular Rate:Normal     Neuro/Psych negative neurological ROS  negative psych ROS   GI/Hepatic negative GI ROS, Neg liver ROS,   Endo/Other  Morbid obesity  Renal/GU negative Renal ROS  negative genitourinary   Musculoskeletal negative musculoskeletal ROS (+)   Abdominal   Peds negative pediatric ROS (+)  Hematology negative hematology ROS (+)   Anesthesia Other Findings   Reproductive/Obstetrics negative OB ROS                            Anesthesia Physical Anesthesia Plan  ASA: IV  Anesthesia Plan: General   Post-op Pain Management:    Induction: Intravenous and Rapid sequence  Airway Management Planned: Oral ETT  Additional Equipment:   Intra-op Plan:   Post-operative Plan: Extubation in OR and Possible Post-op intubation/ventilation  Informed Consent: I have reviewed the patients History and Physical, chart, labs and discussed the procedure including the risks, benefits and alternatives for the proposed anesthesia with the patient or authorized representative who has indicated his/her understanding and acceptance.   Dental advisory given  Plan Discussed with: CRNA  Anesthesia Plan Comments:         Anesthesia Quick Evaluation

## 2014-06-27 NOTE — Op Note (Signed)
Surgeon: Althea Grimmer. Hassell Done, MD, FACS Asst:  Adonis Housekeeper, MD, FACS Anesthesia: General endotracheal Drains: None  Procedure: Laparoscopic Roux en Y gastric bypass with 40 cm BP limb and 100 cm Roux limb, antecolic, antegastric, candy cane to the left.  Closure of Peterson's defect. Upper endoscopy.   Description of Procedure:  The patient was taken to OR 1 at [facility] and given general anesthesia.  The abdomen was prepped with PCMX and draped sterilely.  A time out was performed.  I entered through the left upper quadrant with a 12 mm trocar without difficulty.  The omentum had to be detached from the right lower quadrant and then it could be mobilized. It was massive.    The operation began by identifying the ligament of Treitz. I measured 40 cm downstream and divided the bowel with a 6 cm Covidian brown load stapler.  I sutured a Penrose drain along the Roux limb end.  I measured a 1 meter (100 cm) Roux limb and then placed the distal bowels to the BP limb side by side and performed a stapled jejunojejunostomy. The common defect was closed from either end with 2-0 Vicryl using the Endo Stitch. The mesenteric defect was closed with a running 2-0 silk using the Endo Stitch. Tisseel was applied to the suture line.  The omentum was divided with the harmonic scalpel.  The Nathanson retractor was inserted in the left lateral segment of liver was retracted. The foregut dissection ensued.  I measured 6 cm down from the EG junction.  The enlarged left lateral segment limited exposure of the junction but no hiatal hernia was noted.  A pouch was made by dissecting along the lessor curvature and then entering the retrogastric space.  Two plain purple load 6 cm Covidien Tristaplers were placed followed by multiple applications of the TRS system purple loads to complete the pouch.    The Roux limb was then brought up with the candycane pointed left and a back row of sutures of 2-0 Vicryl were placed. I opened along the  right side of each structure and inserted the 4.5 cm stapler to create the gastrojejunostomy. The common defect was closed from either end with 2-0 Vicryl and a second row was placed anterior to that the Ewald tube acting as a stent across the anastomosis. The Penrose drain was removed. Peterson's defect was closed with 2-0 silk.   Endoscopy was performed by Dr. Excell Seltzer.  No bleeding or bubbles were seen.    The incisions were injected with [place] were closed with 4-0 Vicryl and staples  The patient was taken to the recovery room in satisfactory condition.  Matt B. Hassell Done, MD, FACS

## 2014-06-27 NOTE — Interval H&P Note (Signed)
History and Physical Interval Note:  06/27/2014 10:27 AM  Meghan Hoover  has presented today for surgery, with the diagnosis of MORBID OBESTIY  The various methods of treatment have been discussed with the patient and family. After consideration of risks, benefits and other options for treatment, the patient has consented to  Procedure(s): LAPAROSCOPIC ROUX-EN-Y GASTRIC BYPASS OR POSSIBLE SLEEVE GASTRECTOMY  WITH UPPER ENDOSCOPY (N/A) as a surgical intervention .  The patient's history has been reviewed, patient examined, no change in status, stable for surgery.  I have reviewed the patient's chart and labs.  Questions were answered to the patient's satisfaction.     Adwoa Axe B

## 2014-06-27 NOTE — H&P (View-Only) (Signed)
Chief Complaint:  Super morbid obesity with BMI 62  History of Present Illness:  Meghan Hoover is an 54 y.o. female who has never had abdominal surgery and has a past [place]wickian history having had a trach and requiring ventilation.  The trach was removed in 2014 after a stay at [facility] and [place].  Her weight today is 405.  I have explained that we will try to do her roux Y but will have the sleeve gastrectomy as a fall back in case we need it.  The issue can be if there is adequate small bowel mobility to get it up to the pouch.  She does have GER.  UGI showed GER but no hiatal hernia.  She has multiple layering gallstones.  Informed consent has been obtained about both of these operations.    Past Medical History  Diagnosis Date  . Hypertension   . GERD (gastroesophageal reflux disease)   . Sleep apnea   . Heart failure   . COPD (chronic obstructive pulmonary disease)   . Hypothyroid   . CHF (congestive heart failure)     Past Surgical History  Procedure Laterality Date  . Achilles tendon surgery    . Back surgery      x4  . Tonsilectomy, adenoidectomy, bilateral myringotomy and tubes    . Tracheostomy      11/2012  . Tracheostomy closure      11/2012  . Colonoscopy N/A 05/24/2013    Procedure: COLONOSCOPY;  Surgeon: Daneil Dolin, MD;  Location: [facility];  Service: Endoscopy;  Laterality: N/A;  3:00 PM    Current Outpatient Prescriptions  Medication Sig Dispense Refill  . albuterol (PROVENTIL HFA;VENTOLIN HFA) 108 (90 BASE) MCG/ACT inhaler Inhale 2 puffs into the lungs every 6 (six) hours as needed for wheezing or shortness of breath. 1 Inhaler 6  . aspirin 81 MG chewable tablet Chew 1 tablet (81 mg total) by mouth daily.    . Biotin 5000 MCG TABS Take 1 tablet by mouth daily.    Marland Kitchen buPROPion (WELLBUTRIN XL) 150 MG 24 hr tablet Take 1 tablet by mouth daily.    . cetirizine (ZYRTEC) 10 MG tablet Take 10 mg by mouth daily.    . Cholecalciferol (VITAMIN D3) 2000 UNITS  TABS Take by mouth daily.    . Cyanocobalamin (VITAMIN B-12 CR PO) Take 1 tablet by mouth daily.    Marland Kitchen diltiazem (DILACOR XR) 180 MG 24 hr capsule Take 1 capsule (180 mg total) by mouth daily. 90 capsule 3  . esomeprazole (NEXIUM) 40 MG capsule Take by mouth daily.    . fluticasone (FLONASE) 50 MCG/ACT nasal spray Place 2 sprays into both nostrils 2 (two) times daily. 48 g 1  . ibuprofen (ADVIL,MOTRIN) 200 MG tablet Take 200 mg by mouth every 6 (six) hours as needed.    Marland Kitchen levothyroxine (SYNTHROID, LEVOTHROID) 50 MCG tablet Take 75 mcg by mouth.     . lidocaine (LIDODERM) 5 % Place onto the skin daily.    Marland Kitchen losartan (COZAAR) 100 MG tablet Place 1 tablet (100 mg total) into feeding tube daily.    . methocarbamol (ROBAXIN) 500 MG tablet Take 500 mg by mouth 4 (four) times daily.    . Multiple Vitamins-Minerals (MULTIVITAMIN WITH MINERALS) tablet Take 1 tablet by mouth daily.    Marland Kitchen SPIRIVA HANDIHALER 18 MCG inhalation capsule PLACE 1 CAPSULE (18 MCG TOTAL) INTO INHALER AND INHALE DAILY. 30 capsule 2  . tiotropium (SPIRIVA) 18 MCG inhalation  capsule Place 1 capsule (18 mcg total) into inhaler and inhale daily. 90 capsule 2  . torsemide (DEMADEX) 20 MG tablet Take 1 tablet (20 mg total) by mouth 2 (two) times daily. 180 tablet 3  . traMADol (ULTRAM) 50 MG tablet Take 50 mg by mouth every 6 (six) hours as needed.    . traZODone (DESYREL) 50 MG tablet Take 50 mg by mouth at bedtime.     No current facility-administered medications for this visit.   Augmentin Family History  Problem Relation Age of Onset  . Diabetes Sister   . Emphysema Father   . Asthma Sister   . Heart disease Father   . Heart disease Mother   . Cancer Father     lung  . Colon cancer Neg Hx    Social History:   reports that she quit smoking about 18 months ago. Her smoking use included Cigarettes. She started smoking about 43 years ago. She has a 80 pack-year smoking history. She has never used smokeless tobacco. She reports  that she does not drink alcohol or use illicit drugs.   REVIEW OF SYSTEMS : Negative except for see problem list  Physical Exam:   There were no vitals taken for this visit. There is no weight on file to calculate BMI.  Gen:  WDWN WF NAD  Neurological: Alert and oriented to person, place, and time. Motor and sensory function is grossly intact  Head: Normocephalic and atraumatic.  Eyes: Conjunctivae are normal. Pupils are equal, round, and reactive to light. No scleral icterus.  Neck: Normal range of motion. Neck supple. No tracheal deviation or thyromegaly present.  Cardiovascular:  SR without murmurs or gallops.  No carotid bruits Breast:  Not examined Respiratory: Effort normal.  No respiratory distress. No chest wall tenderness. Breath sounds normal.  No wheezes, rales or rhonchi.  Abdomen:  Large without scars GU:  Not examined Musculoskeletal: Normal range of motion. Extremities are nontender. No cyanosis, edema or clubbing noted Lymphadenopathy: No cervical, preauricular, postauricular or axillary adenopathy is present Skin: Skin is warm and dry. No rash noted. No diaphoresis. No erythema. No pallor. Pscyh: Normal mood and affect. Behavior is normal. Judgment and thought content normal.   LABORATORY RESULTS: No results found for this or any previous visit (from the past 48 hour(s)).   RADIOLOGY RESULTS: No results found.  Problem List: Patient Active Problem List   Diagnosis Date Noted  . Morbid obesity 08/24/2013  . Encounter for screening colonoscopy 05/20/2013  . COPD (chronic obstructive pulmonary disease) 04/30/2013  . Allergic rhinitis 04/30/2013  . 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 CAP (community acquired pneumonia) 11/21/2012  . Sepsis 11/21/2012  . Acute diastolic congestive heart failure 11/20/2012  . Acute respiratory failure with hypoxia 11/19/2012  . Volume overload 11/19/2012  . Bilateral lower extremity edema 11/19/2012  . HTN (hypertension)  11/19/2012  . Normocytic anemia 11/19/2012  . Tobacco dependence 11/19/2012  . ACHILLES TENDINITIS 03/23/2008    Assessment & Plan: supermorbid obesity for lap roux en Y or sleeve if necessary.      Matt B. Hassell Done, MD, [facility] [phone] beeper [phone]  06/15/2014 3:49 PM

## 2014-06-27 NOTE — Transfer of Care (Signed)
Immediate Anesthesia Transfer of Care Note  Patient: Meghan Hoover  Procedure(s) Performed: Procedure(s): LAPAROSCOPIC ROUX-EN-Y GASTRIC BYPASS   WITH UPPER ENDOSCOPY (N/A) UPPER GI ENDOSCOPY  Patient Location: PACU  Anesthesia Type:General  Level of Consciousness: awake, alert , oriented and patient cooperative  Airway & Oxygen Therapy: Patient Spontanous Breathing and Patient connected to face mask oxygen  Post-op Assessment: Report given to RN, Post -op Vital signs reviewed and stable and Patient moving all extremities  Post vital signs: Reviewed and stable  Last Vitals:  Filed Vitals:   06/27/14 1458  BP:   Pulse: 85  Temp:   Resp: 12    Complications: No apparent anesthesia complications

## 2014-06-28 ENCOUNTER — Encounter (HOSPITAL_COMMUNITY): Payer: Self-pay | Admitting: Surgery

## 2014-06-28 ENCOUNTER — Inpatient Hospital Stay (HOSPITAL_COMMUNITY): Payer: BLUE CROSS/BLUE SHIELD

## 2014-06-28 LAB — CBC WITH DIFFERENTIAL/PLATELET
Basophils Absolute: 0 10*3/uL (ref 0.0–0.1)
Basophils Relative: 0 % (ref 0–1)
EOS ABS: 0 10*3/uL (ref 0.0–0.7)
Eosinophils Relative: 0 % (ref 0–5)
HCT: 37.7 % (ref 36.0–46.0)
Hemoglobin: 12 g/dL (ref 12.0–15.0)
LYMPHS ABS: 2.9 10*3/uL (ref 0.7–4.0)
LYMPHS PCT: 25 % (ref 12–46)
MCH: 29.8 pg (ref 26.0–34.0)
MCHC: 31.8 g/dL (ref 30.0–36.0)
MCV: 93.5 fL (ref 78.0–100.0)
MONO ABS: 0.9 10*3/uL (ref 0.1–1.0)
MONOS PCT: 8 % (ref 3–12)
NEUTROS PCT: 67 % (ref 43–77)
Neutro Abs: 7.9 10*3/uL — ABNORMAL HIGH (ref 1.7–7.7)
PLATELETS: 196 10*3/uL (ref 150–400)
RBC: 4.03 MIL/uL (ref 3.87–5.11)
RDW: 15.6 % — ABNORMAL HIGH (ref 11.5–15.5)
WBC: 11.8 10*3/uL — ABNORMAL HIGH (ref 4.0–10.5)

## 2014-06-28 LAB — HEMOGLOBIN AND HEMATOCRIT, BLOOD
HCT: 38.3 % (ref 36.0–46.0)
Hemoglobin: 11.9 g/dL — ABNORMAL LOW (ref 12.0–15.0)

## 2014-06-28 MED ORDER — IOHEXOL 300 MG/ML  SOLN
50.0000 mL | Freq: Once | INTRAMUSCULAR | Status: AC | PRN
  Administered 2014-06-28: 50 mL via INTRAVENOUS

## 2014-06-28 NOTE — Anesthesia Postprocedure Evaluation (Signed)
  Anesthesia Post-op Note  Patient: Meghan Hoover  Procedure(s) Performed: Procedure(s) (LRB): LAPAROSCOPIC ROUX-EN-Y GASTRIC BYPASS   WITH UPPER ENDOSCOPY (N/A) UPPER GI ENDOSCOPY  Patient Location: PACU  Anesthesia Type: General  Level of Consciousness: awake and alert   Airway and Oxygen Therapy: Patient Spontanous Breathing  Post-op Pain: mild  Post-op Assessment: Post-op Vital signs reviewed, Patient's Cardiovascular Status Stable, Respiratory Function Stable, Patent Airway and No signs of Nausea or vomiting  Last Vitals:  Filed Vitals:   06/28/14 0800  BP: 148/70  Pulse: 99  Temp: 37 C  Resp: 20    Post-op Vital Signs: stable   Complications: No apparent anesthesia complications

## 2014-06-28 NOTE — Progress Notes (Signed)
Date:  June 28, 2014 U.R. performed for needs and level of care. Will continue to follow for Case Management needs.  Rhonda Davis, RN, BSN, CCM   [phone] 

## 2014-06-28 NOTE — Progress Notes (Signed)
Pt stated she wears 10/5. Placed pt on home settings. Pt tolerating well at this time.

## 2014-06-28 NOTE — Care Management Note (Signed)
Case Management Note  Patient Details  Name: Meghan Hoover MRN: [id number] Date of Birth: 03-05-1960  Subjective/Objective:                 Post op bipap   Action/Plan: Home when stable will follow  Expected Discharge Date:                 [id number] Expected Discharge Plan:  Home/Self Care  In-House Referral:  NA  Discharge planning Services  CM Consult, NA  Post Acute Care Choice:  NA Choice offered to:  NA  DME Arranged:  N/A DME Agency:  NA  HH Arranged:  NA HH Agency:     Status of Service:  In process, will continue to follow  Medicare Important Message Given:    Date Medicare IM Given:    Medicare IM give by:    Date Additional Medicare IM Given:    Additional Medicare Important Message give by:     If discussed at [place] of Stay Meetings, dates discussed:    Additional Comments:  Leeroy Cha, RN 06/28/2014, 9:24 AM

## 2014-06-28 NOTE — Plan of Care (Signed)
Problem: Food- and Nutrition-Related Knowledge Deficit (NB-1.1) Goal: Nutrition education Formal process to instruct or train a patient/client in a skill or to impart knowledge to help patients/clients voluntarily manage or modify food choices and eating behavior to maintain or improve health. Outcome: Completed/Met Date Met:  06/28/14 Nutrition Education Note  Received consult for diet education per DROP protocol.   Discussed 2 week post op diet with pt. Emphasized that liquids must be non carbonated, non caffeinated, and sugar free. Fluid goals discussed. Pt to follow up with outpatient bariatric RD for further diet progression after 2 weeks. Multivitamins and minerals also reviewed. Teach back method used, pt expressed understanding, expect good compliance.   Diet: First 2 Weeks  You will see the nutritionist about two (2) weeks after your surgery. The nutritionist will increase the types of foods you can eat if you are handling liquids well:  If you have severe vomiting or nausea and cannot handle clear liquids lasting longer than 1 day, call your surgeon  Protein Shake  Drink at least 2 ounces of shake 5-6 times per day  Each serving of protein shakes (usually 8 - 12 ounces) should have a minimum of:  15 grams of protein  And no more than 5 grams of carbohydrate  Goal for protein each day:  Men = 80 grams per day  Women = 60 grams per day  Protein powder may be added to fluids such as non-fat milk or Lactaid milk or Soy milk (limit to 35 grams added protein powder per serving)   Hydration  Slowly increase the amount of water and other clear liquids as tolerated (See Acceptable Fluids)  Slowly increase the amount of protein shake as tolerated  Sip fluids slowly and throughout the day  May use sugar substitutes in small amounts (no more than 6 - 8 packets per day; i.e. Splenda)   Fluid Goal  The first goal is to drink at least 8 ounces of protein shake/drink per day (or as directed  by the nutritionist); some examples of protein shakes are [employer], [employer], EAS Edge HP, and [place]. See handout from pre-op Bariatric Education Class:  Slowly increase the amount of protein shake you drink as tolerated  You may find it easier to slowly sip shakes throughout the day  It is important to get your proteins in first  Your fluid goal is to drink 64 - 100 ounces of fluid daily  It may take a few weeks to build up to this  32 oz (or more) should be clear liquids  And  32 oz (or more) should be full liquids (see below for examples)  Liquids should not contain sugar, caffeine, or carbonation   Clear Liquids:  Water or Sugar-free flavored water (i.e. Fruit H2O, Propel)  Decaffeinated coffee or tea (sugar-free)  Crystal Lite, Wyler's Lite, Minute Maid Lite  Sugar-free Jell-O  Bouillon or broth  Sugar-free Popsicle: *Less than 20 calories each; Limit 1 per day   Full Liquids:  Protein Shakes/Drinks + 2 choices per day of other full liquids  Full liquids must be:  No More Than 12 grams of Carbs per serving  No More Than 3 grams of Fat per serving  Strained low-fat cream soup  Non-Fat milk  Fat-free Lactaid Milk  Sugar-free yogurt (Dannon Lite & Fit, [place] yogurt)   [place], [place], [place]

## 2014-06-28 NOTE — Progress Notes (Signed)
Patient ID: Meghan Hoover, female   DOB: [birth year], 54 y.o.   MRN: [id number] [facility] Progress Note:   1 Day Post-Op  Subjective: Mental status is clear. No complaints Objective: Vital signs in last 24 hours: Temp:  [98.1 F (36.7 C)-98.7 F (37.1 C)] 98.6 F (37 C) (06/07 1200) Pulse Rate:  [79-101] 94 (06/07 1200) Resp:  [10-20] 19 (06/07 1200) BP: (106-185)/(49-98) 145/77 mmHg (06/07 1200) SpO2:  [87 %-96 %] 93 % (06/07 1200) Weight:  [180.532 kg (398 lb)-182.346 kg (402 lb)] 182.346 kg (402 lb) (06/07 0400)  Intake/Output from previous day: 06/06 0701 - 06/07 0700 In: 2860 [I.V.:2860] Out: 1975 [Urine:1925; Blood:50] Intake/Output this shift: Total I/O In: -  Out: 200 [Urine:200]  Physical Exam: Work of breathing is normal.  Incisions ok  Lab Results:  Results for orders placed or performed during the hospital encounter of 06/27/14 (from the past 48 hour(s))  Hemoglobin and hematocrit, blood     Status: None   Collection Time: 06/27/14  3:43 PM  Result Value Ref Range   Hemoglobin 12.7 12.0 - 15.0 g/dL   HCT 39.0 36.0 - 46.0 %  CBC     Status: Abnormal   Collection Time: 06/27/14  3:43 PM  Result Value Ref Range   WBC 15.9 (H) 4.0 - 10.5 K/uL   RBC 4.30 3.87 - 5.11 MIL/uL   Hemoglobin 12.7 12.0 - 15.0 g/dL   HCT 39.4 36.0 - 46.0 %   MCV 91.6 78.0 - 100.0 fL   MCH 29.5 26.0 - 34.0 pg   MCHC 32.2 30.0 - 36.0 g/dL   RDW 15.5 11.5 - 15.5 %   Platelets 237 150 - 400 K/uL  Creatinine, serum     Status: None   Collection Time: 06/27/14  3:43 PM  Result Value Ref Range   Creatinine, Ser 0.98 0.44 - 1.00 mg/dL   GFR calc non Af Amer >60 >60 mL/min   GFR calc Af Amer >60 >60 mL/min    Comment: (NOTE) The eGFR has been calculated using the CKD EPI equation. This calculation has not been validated in all clinical situations. eGFR's persistently <60 mL/min signify possible Chronic Kidney Disease.   MRSA PCR Screening     Status: None   Collection  Time: 06/27/14  4:42 PM  Result Value Ref Range   MRSA by PCR NEGATIVE NEGATIVE    Comment:        The GeneXpert MRSA Assay (FDA approved for NASAL specimens only), is one component of a comprehensive MRSA colonization surveillance program. It is not intended to diagnose MRSA infection nor to guide or monitor treatment for MRSA infections.   CBC WITH DIFFERENTIAL     Status: Abnormal   Collection Time: 06/28/14  3:51 AM  Result Value Ref Range   WBC 11.8 (H) 4.0 - 10.5 K/uL   RBC 4.03 3.87 - 5.11 MIL/uL   Hemoglobin 12.0 12.0 - 15.0 g/dL   HCT 37.7 36.0 - 46.0 %   MCV 93.5 78.0 - 100.0 fL   MCH 29.8 26.0 - 34.0 pg   MCHC 31.8 30.0 - 36.0 g/dL   RDW 15.6 (H) 11.5 - 15.5 %   Platelets 196 150 - 400 K/uL   Neutrophils Relative % 67 43 - 77 %   Neutro Abs 7.9 (H) 1.7 - 7.7 K/uL   Lymphocytes Relative 25 12 - 46 %   Lymphs Abs 2.9 0.7 - 4.0 K/uL   Monocytes Relative 8 3 -  12 %   Monocytes Absolute 0.9 0.1 - 1.0 K/uL   Eosinophils Relative 0 0 - 5 %   Eosinophils Absolute 0.0 0.0 - 0.7 K/uL   Basophils Relative 0 0 - 1 %   Basophils Absolute 0.0 0.0 - 0.1 K/uL    Radiology/Results: Dg Ugi W/water Sol Cm  06/28/2014   CLINICAL DATA:  54 year old female status post Roux-en-Y gastric bypass yesterday.  EXAM: WATER SOLUBLE UPPER GI SERIES  TECHNIQUE: Single-column upper GI series was performed using water soluble contrast.  CONTRAST:  31mL OMNIPAQUE IOHEXOL 300 MG/ML  SOLN  COMPARISON:  03/15/2014.  FLUOROSCOPY TIME:  If the device does not provide the exposure index:  Fluoroscopy Time (in minutes and seconds):  4 minutes and 54 seconds  Number of Acquired Images:  31  FINDINGS: After ingestion of water soluble oral contrast material, the gastric remnant was opacified and normal in appearance. No evidence of extravasation was observed during the examination. After several minutes of observation, a small amount of contrast proceeded through the gastrojejunostomy into the jejunum. Mild  narrowing at the anastomosis likely reflects some postoperative edema. Recurrent gastroesophageal reflux was observed throughout the examination.  IMPRESSION: 1. Expected postoperative findings following Roux-en-Y gastric bypass, as above, without evidence of extravasation or obstruction at this time. 2. Mild narrowing at the gastrojejunostomy site, presumably from postoperative edema. 3. Gastroesophageal reflux was observed throughout the examination.   Electronically Signed   By: Vinnie Langton M.D.   On: 06/28/2014 11:12    Anti-infectives: Anti-infectives    Start     Dose/Rate Route Frequency Ordered Stop   06/27/14 0844  cefOXitin (MEFOXIN) 2 g in dextrose 5 % 50 mL IVPB     2 g 100 mL/hr over 30 Minutes Intravenous On call to O.R. 06/27/14 0844 06/27/14 1305      Assessment/Plan: Problem List: Patient Active Problem List   Diagnosis Date Noted  . 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 Morbid obesity with BMI of 60.0-69.9, adult 06/27/2014  . Morbid obesity 08/24/2013  . Encounter for screening colonoscopy 05/20/2013  . COPD (chronic obstructive pulmonary disease) 04/30/2013  . Allergic rhinitis 04/30/2013  . 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 CAP (community acquired pneumonia) 11/21/2012  . Sepsis 11/21/2012  . Acute diastolic congestive heart failure 11/20/2012  . Acute respiratory failure with hypoxia 11/19/2012  . Volume overload 11/19/2012  . Bilateral lower extremity edema 11/19/2012  . HTN (hypertension) 11/19/2012  . Normocytic anemia 11/19/2012  . Tobacco dependence 11/19/2012  . ACHILLES TENDINITIS 03/23/2008    UGI OK.  Will transfer to 5W and begin PD 1 diet.  1 Day Post-Op    LOS: 1 day   Matt B. Hassell Done, MD, [facility] [phone] beeper [phone]  06/28/2014 1:04 PM

## 2014-06-29 LAB — CBC WITH DIFFERENTIAL/PLATELET
Basophils Absolute: 0 10*3/uL (ref 0.0–0.1)
Basophils Relative: 0 % (ref 0–1)
EOS ABS: 0.2 10*3/uL (ref 0.0–0.7)
Eosinophils Relative: 2 % (ref 0–5)
HCT: 39.4 % (ref 36.0–46.0)
HEMOGLOBIN: 11.9 g/dL — AB (ref 12.0–15.0)
Lymphocytes Relative: 24 % (ref 12–46)
Lymphs Abs: 2.6 10*3/uL (ref 0.7–4.0)
MCH: 29.3 pg (ref 26.0–34.0)
MCHC: 30.2 g/dL (ref 30.0–36.0)
MCV: 97 fL (ref 78.0–100.0)
MONO ABS: 0.8 10*3/uL (ref 0.1–1.0)
MONOS PCT: 8 % (ref 3–12)
Neutro Abs: 7.2 10*3/uL (ref 1.7–7.7)
Neutrophils Relative %: 66 % (ref 43–77)
Platelets: 203 10*3/uL (ref 150–400)
RBC: 4.06 MIL/uL (ref 3.87–5.11)
RDW: 15.9 % — ABNORMAL HIGH (ref 11.5–15.5)
WBC: 10.9 10*3/uL — ABNORMAL HIGH (ref 4.0–10.5)

## 2014-06-29 NOTE — Discharge Instructions (Signed)

## 2014-06-29 NOTE — Progress Notes (Signed)
Patient alert and oriented, pain is controlled. Patient is tolerating fluids,  advanced to protein shake today, patient tolerated well. Reviewed Gastric Bypass discharge instructions with patient and patient is able to articulate understanding. Provided information on BELT program, Support Group and [facility] outpatient pharmacy. All questions answered, will continue to monitor.    

## 2014-06-29 NOTE — Discharge Summary (Signed)
Physician Discharge Summary  Patient ID: Meghan Hoover MRN: [id number] DOB/AGE: 04/09/1960 54 y.o.  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  Monitored in stepdown postop.  UGI on PD 1 looked good.  Was very motivated and got up and walked and advanced PO liquids to shakes.  Wanting to go home on PD 2.  Very motivated.  Has pain meds at home if needed.    Consults: none  Significant Diagnostic Studies: UGI    Discharge Exam: Blood pressure 159/96, pulse 98, temperature 98.5 F (36.9 C), temperature source Oral, resp. rate 20, height 5\' 8"  (1.727 m), weight 180.532 kg (398 lb), SpO2 93 %. Incisions OK-O2 sats noted.    Disposition: 01-Home or Self Care  Discharge Instructions    Ambulate hourly while awake    Complete by:  As directed      Call MD for:  difficulty breathing, headache or visual disturbances    Complete by:  As directed      Call MD for:  persistant dizziness or light-headedness    Complete by:  As directed      Call MD for:  persistant nausea and vomiting    Complete by:  As directed      Call MD for:  redness, tenderness, or signs of infection (pain, swelling, redness, odor or green/yellow discharge around incision site)    Complete by:  As directed      Call MD for:  severe uncontrolled pain    Complete by:  As directed      Call MD for:  temperature >101 F    Complete by:  As directed      Diet bariatric full liquid    Complete by:  As directed      Discharge instructions    Complete by:  As directed   It is very important that you use your CPAP at night and that you get up frequently and walk during the day.  Use the incentive  spirometer to remind yourself to take deep breaths.     Incentive spirometry    Complete by:  As directed   Perform hourly while awake     No wound care    Complete by:  As directed   Enjoy shower and shampoo ad lib            Medication List    STOP taking these medications        aspirin 81 MG chewable tablet     ibuprofen 200 MG tablet  Commonly known as:  ADVIL,MOTRIN     traMADol 50 MG tablet  Commonly known as:  ULTRAM      TAKE these medications        albuterol 108 (90 BASE) MCG/ACT inhaler  Commonly known as:  PROVENTIL HFA;VENTOLIN HFA  Inhale 2 puffs into the lungs every 6 (six) hours as needed for wheezing or shortness of breath.     buPROPion 300 MG 24 hr tablet  Commonly known as:  WELLBUTRIN XL  Take 300 mg by mouth every morning.     CALCIUM 600 + D PO  Take 1 tablet by  mouth 3 (three) times daily.     cetirizine 10 MG tablet  Commonly known as:  ZYRTEC  Take 10 mg by mouth daily. Alternates with claritin.     diltiazem 180 MG 24 hr capsule  Commonly known as:  DILACOR XR  Take 1 capsule (180 mg total) by mouth daily.     diphenhydramine-acetaminophen 25-500 MG Tabs  Commonly known as:  TYLENOL PM  Take 1 tablet by mouth at bedtime as needed.     esomeprazole 40 MG capsule  Commonly known as:  NEXIUM  Take 40 mg by mouth every morning.     fluticasone 50 MCG/ACT nasal spray  Commonly known as:  FLONASE  Place 2 sprays into both nostrils 2 (two) times daily.     levothyroxine 75 MCG tablet  Commonly known as:  SYNTHROID, LEVOTHROID  Take 75 mcg by mouth daily before breakfast.     lidocaine 5 %  Commonly known as:  LIDODERM  Place 1-2 patches onto the skin daily.     loratadine 10 MG tablet  Commonly known as:  CLARITIN  Take 10 mg by mouth every morning. Alternates with Zyrtec.     losartan 100 MG tablet  Commonly known as:  COZAAR  Place 1 tablet (100 mg total) into feeding tube daily.     multivitamin with minerals tablet   Take 1 tablet by mouth every morning.     PAZEO 0.7 % Soln  Generic drug:  Olopatadine HCl  Place 1 drop into both eyes every morning.     SPIRIVA HANDIHALER 18 MCG inhalation capsule  Generic drug:  tiotropium  PLACE 1 CAPSULE (18 MCG TOTAL) INTO INHALER AND INHALE DAILY.     SYSTANE OP  Apply 1 drop to eye daily as needed (dry eyes).     torsemide 20 MG tablet  Commonly known as:  DEMADEX  Take 1 tablet (20 mg total) by mouth 2 (two) times daily.     traZODone 50 MG tablet  Commonly known as:  DESYREL  Take 50 mg by mouth at bedtime.     VITAMIN B-12 CR PO  Take 1 tablet by mouth every morning.     Vitamin D3 2000 UNITS Tabs  Take 1 tablet by mouth every morning.           Follow-up Information    Follow up with Pedro Earls, MD. Go on 07/14/2014.   Specialty:  General Surgery   Why:  For Post-Op Check at 9:15AM   Contact information:   [place] [phone]       Signed: Pedro Earls 06/29/2014, 11:11 AM

## 2014-06-30 ENCOUNTER — Telehealth (HOSPITAL_COMMUNITY): Payer: Self-pay

## 2014-06-30 NOTE — Telephone Encounter (Signed)
Made discharge phone call to patient per DROP protocol. Asking the following questions.    1. Do you have someone to care for you now that you are home?  yes 2. Are you having pain now that is not relieved by your pain medication?  no 3. Are you able to drink the recommended daily amount of fluids (48 ounces minimum/day) and protein (60-80 grams/day) as prescribed by the dietitian or nutritional counselor?  yes 4. Are you taking the vitamins and minerals as prescribed?  yes 5. Do you have the "on call" number to contact your surgeon if you have a problem or question?  yes 6. Are your incisions free of redness, swelling or drainage? (If steri strips, address that these can fall off, shower as tolerated) yes 7. Have your bowels moved since your surgery?  If not, are you passing gas?  No, yes 8. Are you up and walking 3-4 times per day?  yes    1. Do you have an appointment made to see your surgeon in the next month?  yes 2. Were you provided your discharge medications before your surgery or before you were discharged from the hospital and are you taking them without problem?  yes 3. Were you provided phone numbers to the clinic/surgeon's office?  yes 4. Did you watch the patient education video module in the (clinic, surgeon's office, etc.) before your surgery? yes 5. Do you have a discharge checklist that was provided to you in the hospital to reference with instructions on how to take care of yourself after surgery?  yes 6. Did you see a dietitian or nutritional counselor while you were in the hospital?  yes 7. Do you have an appointment to see a dietitian or nutritional counselor in the next month?  yes

## 2014-07-12 ENCOUNTER — Encounter: Payer: BLUE CROSS/BLUE SHIELD | Attending: Surgery

## 2014-07-12 DIAGNOSIS — Z713 Dietary counseling and surveillance: Secondary | ICD-10-CM | POA: Insufficient documentation

## 2014-07-12 DIAGNOSIS — Z6841 Body Mass Index (BMI) 40.0 and over, adult: Secondary | ICD-10-CM | POA: Diagnosis not present

## 2014-07-12 NOTE — Progress Notes (Addendum)
Bariatric Class:  Appt start time: 1530 end time:  1630.  2 Week Post-Operative Nutrition Class  Patient was seen on 07/12/14 for Post-Operative Nutrition education at [facility].   Surgery date:  06/27/14 Surgery type: RYGB Start weight at [facility]: 395 lbs on 03/22/14, 406.5 on 5/16 Weight today: 376.0 lbs Weight change: 30.5 lbs  TANITA  BODY COMP RESULTS  06/06/14 07/12/14   BMI (kg/m^2) 61.8 57.2   Fat Mass (lbs) 214.5 209.0   Fat Free Mass (lbs) 192 167.0   Total Body Water (lbs) 140.5 122.0    The following the learning objectives were met by the patient during this course:  Identifies Phase 3A (Soft, High Proteins) Dietary Goals and will begin from 2 weeks post-operatively to 2 months post-operatively  Identifies appropriate sources of fluids and proteins   States protein recommendations and appropriate sources post-operatively  Identifies the need for appropriate texture modifications, mastication, and bite sizes when consuming solids  Identifies appropriate multivitamin and calcium sources post-operatively  Describes the need for physical activity post-operatively and will follow MD recommendations  States when to call healthcare provider regarding medication questions or post-operative complications  Handouts given during class include:  Phase 3A: Soft, High Protein Diet Handout  Follow-Up Plan: Patient will follow-up at [facility] in 6 weeks for 2 month post-op nutrition visit for diet advancement per MD.

## 2014-07-18 ENCOUNTER — Other Ambulatory Visit (HOSPITAL_COMMUNITY): Payer: Self-pay | Admitting: Family Medicine

## 2014-07-18 DIAGNOSIS — Z1231 Encounter for screening mammogram for malignant neoplasm of breast: Secondary | ICD-10-CM

## 2014-07-22 ENCOUNTER — Ambulatory Visit (HOSPITAL_COMMUNITY): Payer: BLUE CROSS/BLUE SHIELD

## 2014-08-02 ENCOUNTER — Other Ambulatory Visit: Payer: Self-pay | Admitting: Emergency Medicine

## 2014-08-04 ENCOUNTER — Other Ambulatory Visit: Payer: Self-pay | Admitting: Emergency Medicine

## 2014-08-23 ENCOUNTER — Encounter: Payer: Self-pay | Admitting: Dietician

## 2014-08-23 ENCOUNTER — Encounter: Payer: BLUE CROSS/BLUE SHIELD | Attending: Surgery | Admitting: Dietician

## 2014-08-23 DIAGNOSIS — Z713 Dietary counseling and surveillance: Secondary | ICD-10-CM | POA: Insufficient documentation

## 2014-08-23 DIAGNOSIS — Z6841 Body Mass Index (BMI) 40.0 and over, adult: Secondary | ICD-10-CM | POA: Diagnosis not present

## 2014-08-23 NOTE — Patient Instructions (Addendum)
Goals: Follow Phase 3B: High Protein + Non-Starchy Vegetables Eat 3-6 small meals/snacks, every 3-5 hrs Increase lean protein foods to meet 64g goal Increase fluid intake to 64oz + Avoid drinking 15 minutes before, during and 30 minutes after eating Aim for >30 min of physical activity daily Continue to keep tempting foods out of the house   Surgery date:  06/27/14 Surgery type: RYGB Start weight at [facility]: 395 lbs on 03/22/14, 406.5 on 5/16 Weight today: 351.0 lbs  Weight change: 25 lbs Total weight loss: 55.5 lbs  TANITA  BODY COMP RESULTS  06/06/14 07/12/14 08/23/14   BMI (kg/m^2) 61.8 57.2 53.4   Fat Mass (lbs) 214.5 209.0 179.0   Fat Free Mass (lbs) 192 167.0 172.0   Total Body Water (lbs) 140.5 122.0 126.0

## 2014-08-23 NOTE — Progress Notes (Signed)
  Follow-up visit:  8 Weeks Post-Operative RYGB Surgery  Medical Nutrition Therapy:  Appt start time: 1400 end time:  1430.  Primary concerns today: Post-operative Bariatric Surgery Nutrition Management. Returns with a 25 lbs weight loss. Getting in her fluid in. This is best she has felt in years! Has not any problems tolerating foods. Eating no more than 2.8 oz at a meal. Thew up once after eating too quickly. Has learned to eat slowly and chew well.  Surgery date:  06/27/14 Surgery type: RYGB Start weight at [facility]: 395 lbs on 03/22/14, 406.5 on 5/16 Weight today: 351.0 lbs  Weight change: 25 lbs Total weight loss: 55.5 lbs  TANITA  BODY COMP RESULTS  06/06/14 07/12/14 08/23/14   BMI (kg/m^2) 61.8 57.2 53.4   Fat Mass (lbs) 214.5 209.0 179.0   Fat Free Mass (lbs) 192 167.0 172.0   Total Body Water (lbs) 140.5 122.0 126.0     Preferred Learning Style:   No preference indicated   Learning Readiness:   Ready  24-hr recall: B (AM): Premier Protein shake or yogurt with cheese or egg with [place] bacon (14-30 g) Snk (AM): none L (PM): 2.5 oz cheese stick with lunch meat or [place] burger with mustard (18 g) Snk (PM): cheese stick (6 g) D (PM): 2.5 oz fish or chicken (18 g) Snk (PM): pork rinds 2 x week or pickle or cheese stick or dannon light and fit yogurt or SF popsicle (0-12 g)  Fluid intake: protein shake, water, unsweet decaf tea (at least 64 oz fluid) Estimated total protein intake: 56-84 g  Medications: see list  Supplementation: taking, though having trouble remember getting in second multivitamin sometimes  Using straws: No, except at restaurant Drinking while eating: No, might take sips Hair loss: No Carbonated beverages: 6 oz Diet Pepsi 2 x week N/V/D/C: nausea if she eats too much and vomited one time, had 3 day constipation once and took Milk of Magnesia and had diarrhea once  Dumping syndrome: ice cream once - had diarrhea   Recent physical activity:  Doing chair  exercises, uses band, and doing some swimming most days  Progress Towards Goal(s):  In progress.  Handouts given during visit include:  Phase 3B High Protein + Non Starchy Vegetables   Nutritional Diagnosis:  [place]-3.3 Overweight/obesity related to past poor dietary habits and physical inactivity as evidenced by patient w/ recent RYGB surgery following dietary guidelines for continued weight loss.    Intervention:  Nutrition education/diet advancement. Goals: Follow Phase 3B: High Protein + Non-Starchy Vegetables Eat 3-6 small meals/snacks, every 3-5 hrs Increase lean protein foods to meet 64g goal Increase fluid intake to 64oz + Avoid drinking 15 minutes before, during and 30 minutes after eating Aim for >30 min of physical activity daily Continue to keep tempting foods out of the house   Teaching Method Utilized:  Visual Auditory Hands on  Barriers to learning/adherence to lifestyle change: none  Demonstrated degree of understanding via:  Teach Back   Monitoring/Evaluation:  Dietary intake, exercise, and body weight. Follow up in 1 months for 3 month post-op visit.

## 2014-09-07 ENCOUNTER — Ambulatory Visit (HOSPITAL_COMMUNITY)
Admission: RE | Admit: 2014-09-07 | Discharge: 2014-09-07 | Disposition: A | Discharge status: Home / Self Care | Payer: BLUE CROSS/BLUE SHIELD | Source: Ambulatory Visit | Attending: Family Medicine | Admitting: Family Medicine

## 2014-09-07 DIAGNOSIS — Z1231 Encounter for screening mammogram for malignant neoplasm of breast: Secondary | ICD-10-CM | POA: Insufficient documentation

## 2014-09-09 ENCOUNTER — Other Ambulatory Visit: Payer: Self-pay | Admitting: Emergency Medicine

## 2014-09-15 ENCOUNTER — Encounter: Payer: Self-pay | Admitting: Cardiology

## 2014-09-15 ENCOUNTER — Ambulatory Visit: Payer: BLUE CROSS/BLUE SHIELD | Admitting: Cardiology

## 2014-09-15 ENCOUNTER — Ambulatory Visit (INDEPENDENT_AMBULATORY_CARE_PROVIDER_SITE_OTHER): Payer: BLUE CROSS/BLUE SHIELD | Admitting: Cardiology

## 2014-09-15 VITALS — BP 142/74 | HR 76 | Ht 68.0 in | Wt 339.8 lb

## 2014-09-15 DIAGNOSIS — I5032 Chronic diastolic (congestive) heart failure: Secondary | ICD-10-CM | POA: Diagnosis not present

## 2014-09-15 DIAGNOSIS — I10 Essential (primary) hypertension: Secondary | ICD-10-CM

## 2014-09-15 NOTE — Patient Instructions (Signed)
Your physician wants you to follow-up in:  [place] DR[address] BRANCH. You will receive a reminder letter in the mail two months in advance. If you don't receive a letter, please call our office to schedule the follow-up appointment.  STOP ASPIRIN  WE WILL REQUEST YOUR LABS FROM YOUR PCP  Thanks for choosing [facility]!!!

## 2014-09-15 NOTE — Progress Notes (Signed)
Patient ID: Meghan Hoover, female   DOB: 08-05-1960, 54 y.o.   MRN: [id number]     Clinical Summary [name] is a 54 y.o.female seen today for follow up of the following medical problems.   1. Chronic diastolic heart failure - denies any SOB or DOE. No significant LE edema - since her bariatric surgery and significant weight loss her fluid status and breathing symptoms have significaintly improved.   2. OSA  - compliant with CPAP machine.   3. HTN - compliant with meds - checks at home daily, typically 110/70s  4. Obesity - s/p gastric bypass 06/2014. Continues to lose weight since surgery.    Past Medical History  Diagnosis Date  . Hypertension   . GERD (gastroesophageal reflux disease)   . Heart failure   . COPD (chronic obstructive pulmonary disease)   . Hypothyroid   . CHF (congestive heart failure)   . Coronary artery disease   . Sleep apnea     uses CPAP  . Shortness of breath dyspnea     with exertion   . Pneumonia     2014  . Bronchitis     hx of   . Depression   . Anxiety   . History of frequent urinary tract infections     last one 10 years ago   . Urinary incontinence   . Bilateral lower extremity edema     hx of 2014   . Dysphagia   . Numbness     right hand since 2014     Allergies  Allergen Reactions  . Augmentin [Amoxicillin-Pot Clavulanate] Other (See Comments)    thrush     Current Outpatient Prescriptions  Medication Sig Dispense Refill  . albuterol (PROVENTIL HFA;VENTOLIN HFA) 108 (90 BASE) MCG/ACT inhaler Inhale 2 puffs into the lungs every 6 (six) hours as needed for wheezing or shortness of breath. 1 Inhaler 6  . buPROPion (WELLBUTRIN XL) 300 MG 24 hr tablet Take 300 mg by mouth every morning.    . Calcium Carb-Cholecalciferol (CALCIUM 600 + D PO) Take 1 tablet by mouth 3 (three) times daily.    . cetirizine (ZYRTEC) 10 MG tablet Take 10 mg by mouth daily. Alternates with claritin.    . Cholecalciferol (VITAMIN D3) 2000 UNITS  TABS Take 1 tablet by mouth every morning.     . Cyanocobalamin (VITAMIN B-12 CR PO) Take 1 tablet by mouth every morning.     . diltiazem (DILACOR XR) 180 MG 24 hr capsule Take 1 capsule (180 mg total) by mouth daily. (Patient taking differently: Take 180 mg by mouth at bedtime. ) 90 capsule 3  . diphenhydramine-acetaminophen (TYLENOL PM) 25-500 MG TABS Take 1 tablet by mouth at bedtime as needed.    Marland Kitchen esomeprazole (NEXIUM) 40 MG capsule Take 40 mg by mouth every morning.     . fluticasone (FLONASE) 50 MCG/ACT nasal spray Place 2 sprays into both nostrils 2 (two) times daily. 48 g 1  . levothyroxine (SYNTHROID, LEVOTHROID) 75 MCG tablet Take 75 mcg by mouth daily before breakfast.    . lidocaine (LIDODERM) 5 % Place 1-2 patches onto the skin daily.     Marland Kitchen loratadine (CLARITIN) 10 MG tablet Take 10 mg by mouth every morning. Alternates with Zyrtec.    Marland Kitchen losartan (COZAAR) 100 MG tablet Place 1 tablet (100 mg total) into feeding tube daily. (Patient taking differently: Take 100 mg by mouth daily. )    . Multiple Vitamins-Minerals (MULTIVITAMIN WITH MINERALS) tablet  Take 1 tablet by mouth every morning.     Marland Kitchen PAZEO 0.7 % SOLN Place 1 drop into both eyes every morning.  8  . Polyethyl Glycol-Propyl Glycol (SYSTANE OP) Apply 1 drop to eye daily as needed (dry eyes).    . SPIRIVA HANDIHALER 18 MCG inhalation capsule PLACE 1 CAPSULE (18 MCG TOTAL) INTO INHALER AND INHALE DAILY. 30 capsule 2  . torsemide (DEMADEX) 20 MG tablet Take 1 tablet (20 mg total) by mouth 2 (two) times daily. 180 tablet 3  . traZODone (DESYREL) 50 MG tablet Take 50 mg by mouth at bedtime.     No current facility-administered medications for this visit.     Past Surgical History  Procedure Laterality Date  . Achilles tendon surgery    . Back surgery      x4  . Tonsilectomy, adenoidectomy, bilateral myringotomy and tubes    . Tracheostomy      11/2012  . Tracheostomy closure      11/2012  . Colonoscopy N/A 05/24/2013     Procedure: COLONOSCOPY;  Surgeon: Daneil Dolin, MD;  Location: [facility];  Service: Endoscopy;  Laterality: N/A;  3:00 PM  . Gastrostomy w/ feeding tube      hx of 2014   . Swallowing evaluation       hx of   . Tonsillectomy    . Laparoscopic roux-en-y gastric bypass with upper endoscopy and removal of lap band N/A 06/27/2014    Procedure: LAPAROSCOPIC ROUX-EN-Y GASTRIC BYPASS   WITH UPPER ENDOSCOPY;  Surgeon: Johnathan Hausen, MD;  Location: [facility];  Service: General;  Laterality: N/A;  . Upper gi endoscopy  06/27/2014    Procedure: UPPER GI ENDOSCOPY;  Surgeon: Johnathan Hausen, MD;  Location: [facility];  Service: General;;     Allergies  Allergen Reactions  . Augmentin [Amoxicillin-Pot Clavulanate] Other (See Comments)    thrush      Family History  Problem Relation Age of Onset  . Diabetes Sister   . Emphysema Father   . Asthma Sister   . Heart disease Father   . Heart disease Mother   . Cancer Father     lung  . Colon cancer Neg Hx      Social History [name] reports that she quit smoking about 21 months ago. Her smoking use included Cigarettes. She started smoking about 43 years ago. She has a 120 pack-year smoking history. She has never used smokeless tobacco. [name] reports that she does not drink alcohol.   Review of Systems CONSTITUTIONAL: No weight loss, fever, chills, weakness or fatigue.  HEENT: Eyes: No visual loss, blurred vision, double vision or yellow sclerae.No hearing loss, sneezing, congestion, runny nose or sore throat.  SKIN: No rash or itching.  CARDIOVASCULAR: per HPI RESPIRATORY: No shortness of breath, cough or sputum.  GASTROINTESTINAL: No anorexia, nausea, vomiting or diarrhea. No abdominal pain or blood.  GENITOURINARY: No burning on urination, no polyuria NEUROLOGICAL: No headache, dizziness, syncope, paralysis, ataxia, numbness or tingling in the extremities. No change in bowel or bladder control.  MUSCULOSKELETAL: No muscle, back  pain, joint pain or stiffness.  LYMPHATICS: No enlarged nodes. No history of splenectomy.  PSYCHIATRIC: No history of depression or anxiety.  ENDOCRINOLOGIC: No reports of sweating, cold or heat intolerance. No polyuria or polydipsia.  Marland Kitchen   Physical Examination Filed Vitals:   09/15/14 1322  BP: 142/74  Pulse: 76   Filed Vitals:   09/15/14 1322  Height: 5\' 8"  (1.727  m)  Weight: 339 lb 12.8 oz (154.132 kg)    Gen: resting comfortably, no acute distress HEENT: no scleral icterus, pupils equal round and reactive, no palptable cervical adenopathy,  CV: RRR, no m/r/g, no JVD Resp: Clear to auscultation bilaterally GI: abdomen is soft, non-tender, non-distended, normal bowel sounds, no hepatosplenomegaly MSK: extremities are warm, no edema.  Skin: warm, no rash Neuro:  no focal deficits Psych: appropriate affect  06/2013 Exercise MPI Baseline tracing shows sinus rhythm at 91 beats per min with poor weight progression. Patient was exercised by Bruce protocol for 2 min and 46 seconds achieving a maximum work load of 4.6 METS. Heart rate increased from 94 beats per min up to 146 beats per min which was 87% of the maximum age predicted heart rate. Blood pressure increased from 154/76 up to 200/72. No chest pain rule was reported, however exercise was limited by shortness of breath and fatigue. There were no clearly diagnostic ST segment abnormalities, and no arrhythmias were noted.  Analysis of the overall perfusion data finds somewhat heterogeneous radiotracer uptake with evidence of breast attenuation.  Tomographic views were obtained using the short axis, vertical long axis, and horizontal long axis planes. There is a small, mild intensity basal anterior defect that is fixed and consistent with soft tissue attenuation. There is also a small, moderate intensity inferior wall defect that is generally more prominent at rest than stress, overall fixed with partial reversibility  in the mid inferoseptal and inferolateral segment. Suspect that this is related to variable diaphragmatic attenuation, however cannot completely exclude a mild degree of ischemia.  Gated imaging reveals an EDV of 109, ESV of 20, TID ratio 0.69, and LVEF of 75% with normal wall motion.  IMPRESSION: Low risk exercise Cardiolite as outlined. Patient had limited exercise tolerance due to shortness of breath and fatigue, achieved a maximum work load of 4.6 METS. There were no diagnostic ST segment abnormalities or arrhythmias. Perfusion imaging is most consistent with breast tissue attenuation as well as variable diaphragmatic attenuation, cannot completely exclude a mild degree of inferior ischemia as outlined. LVEF is vigorous at 75% with normal volumes and wall motion.  Diagnostic Studies 12/04/2012 Echo  Technically difficult study, LVEF 30-94%, grade I diastolic dysfunction, mild AS, cannot estimate PASP, normal IVC, normal RV   11/24/12 Echo  LVEF 55-60%, moderate RV dilatation with normal function   11/20/12 Echo  LVEF 65-70%, grade II diastolic dysfunction, flattened RV septum in diastole and systole, severe RV dilatatoin, PASP 53mmHg   02/2013 PFTs Obstruction and diffusion defect suggesting emphysema, however absence of hyperinflaction. Overall minimal obstructive disease.    Assessment and Plan  1. Chronic Diastolic heart failure  - no current symptoms, continue current meds.   2. OSA - continue CPAP  3. HTN - at goal, continue current meds  4. Obesity - s/p gastric bypass with significant weight loss       Arnoldo Lenis, M.D.

## 2014-09-29 ENCOUNTER — Encounter: Payer: BLUE CROSS/BLUE SHIELD | Attending: Surgery | Admitting: Dietician

## 2014-09-29 ENCOUNTER — Encounter: Payer: Self-pay | Admitting: Dietician

## 2014-09-29 DIAGNOSIS — Z6841 Body Mass Index (BMI) 40.0 and over, adult: Secondary | ICD-10-CM | POA: Insufficient documentation

## 2014-09-29 DIAGNOSIS — Z713 Dietary counseling and surveillance: Secondary | ICD-10-CM | POA: Diagnosis not present

## 2014-09-29 NOTE — Patient Instructions (Addendum)
Goals: Follow Phase 3B: High Protein + Non-Starchy Vegetables Eat 3-6 small meals/snacks, every 3-5 hrs Increase lean protein foods to meet 64g goal Increase fluid intake to 64oz + Avoid drinking 15 minutes before, during and 30 minutes after eating Aim for >30 min of physical activity daily Continue to keep tempting foods out of the house Try Quest protein chips or Parmesan Crisps from [employer] protein first, then vegetables, and then carbs (sweet potato)  Surgery date:  06/27/14 Surgery type: RYGB Start weight at [facility]: 395 lbs on 03/22/14, 406.5 on 5/16 Weight today: 333.5 lbs  Weight change: 17.5 lbs Total weight loss: 73 lbs  TANITA  BODY COMP RESULTS  06/06/14 07/12/14 08/23/14 09/29/14   BMI (kg/m^2) 61.8 57.2 53.4 50.7   Fat Mass (lbs) 214.5 209.0 179.0 164.0   Fat Free Mass (lbs) 192 167.0 172.0 169.5   Total Body Water (lbs) 140.5 122.0 126.0 124.0

## 2014-09-29 NOTE — Progress Notes (Signed)
  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 Returns with a 17.5 lbs weight loss. Recently had injections in her knees and started using the exercise bike and elliptical. Had a couple of gout flares and is now taking medication for that as needed.   Working on not eating quickly or eating too much. Only had one incident this past month.  Eating around 2.8 oz at a meal. Eating protein at meals and vegetables at snacks. Not feeling very hungry.   Surgery date:  06/27/14 Surgery type: RYGB [facility] weight at [facility]: 395 lbs on 03/22/14, 406.5 on 5/16 Weight today: 333.5 lbs  Weight change: 17.5 lbs Total weight loss: 73 lbs  TANITA  BODY COMP RESULTS  06/06/14 07/12/14 08/23/14 09/29/14   BMI (kg/m^2) 61.8 57.2 53.4 50.7   Fat Mass (lbs) 214.5 209.0 179.0 164.0   Fat Free Mass (lbs) 192 167.0 172.0 169.5   Total Body Water (lbs) 140.5 122.0 126.0 124.0     Preferred Learning Style:   No preference indicated   Learning Readiness:   Ready  24-hr recall: B (AM): Premier Protein shake  egg with [place] bacon on weekend (14-30 g) Snk (AM): yogurt dannon light and fit (12 g) L (PM): 2.5 oz cheese stick with lunch meat or turkey/beef burger with mustard or chicken hot dog or salad with meat (18 g) Snk (PM): salad or green beans or greens  D (PM): 2.5 oz fish/crab or chicken or [place] meatloaf (18 g) Snk (PM): salad or green beans or greens   Fluid intake: protein shake, water, unsweet decaf tea (at least 64 oz fluid) Estimated total protein intake: around 66 g  Medications: see list  Supplementation: taking  Using straws: No, except at restaurant Drinking while eating: might take sips if having something spicy Hair loss: No Carbonated beverages: 7 oz Diet Pepsi 2 x week max N/V/D/C: No Dumping syndrome: None   Recent physical  activity:  20 minutes on bike, 10 minutes on elliptical 3 x week  Progress Towards Goal(s):  In progress.  Handouts given during visit include:  Phase 3B High Protein + Non Starchy Vegetables   Nutritional Diagnosis:  [place]-3.3 Overweight/obesity related to past poor dietary habits and physical inactivity as evidenced by patient w/ recent RYGB surgery following dietary guidelines for continued weight loss.    Intervention:  Nutrition education/diet reinforcement Goals: Follow Phase 3B: High Protein + Non-Starchy Vegetables Eat 3-6 small meals/snacks, every 3-5 hrs Increase lean protein foods to meet 64g goal Increase fluid intake to 64oz + Avoid drinking 15 minutes before, during and 30 minutes after eating Aim for >30 min of physical activity daily Continue to keep tempting foods out of the house Try Quest protein chips or Parmesan Crisps from [employer] protein first, then vegetables, and then carbs (sweet potato)  Teaching Method Utilized:  Visual Auditory Hands on  Barriers to learning/adherence to lifestyle change: none  Demonstrated degree of understanding via:  Teach Back   Monitoring/Evaluation:  Dietary intake, exercise, and body weight. Follow up in 3 months for 6 month post-op visit.

## 2014-10-04 ENCOUNTER — Encounter: Payer: Self-pay | Admitting: Emergency Medicine

## 2014-10-04 ENCOUNTER — Ambulatory Visit (INDEPENDENT_AMBULATORY_CARE_PROVIDER_SITE_OTHER): Payer: BLUE CROSS/BLUE SHIELD | Admitting: Emergency Medicine

## 2014-10-04 VITALS — BP 118/70 | HR 72 | Ht 68.0 in | Wt 312.0 lb

## 2014-10-04 DIAGNOSIS — Z23 Encounter for immunization: Secondary | ICD-10-CM

## 2014-10-04 DIAGNOSIS — G4733 Obstructive sleep apnea (adult) (pediatric): Secondary | ICD-10-CM | POA: Diagnosis not present

## 2014-10-04 DIAGNOSIS — J309 Allergic rhinitis, unspecified: Secondary | ICD-10-CM | POA: Diagnosis not present

## 2014-10-04 NOTE — Assessment & Plan Note (Signed)
Continue fluticasone  

## 2014-10-04 NOTE — Assessment & Plan Note (Signed)
She does have COPD with significant tobacco history but is not clear to me how much of her dyspnea is related to this versus her obesity. Clearly she is improved overall since her significant weight loss. I believe we should repeat her pulmonary function testing once she has reached her target weight and use this information and her exertional tolerance to decide whether to change her Spiriva to an alternative such as anoro

## 2014-10-04 NOTE — Patient Instructions (Addendum)
We will work on performing a home CPAP auto-titration study.  Keep albuterol available to use as needed for shortness of breath.  Continue Spiriva daily for now.  We will need to repeat your pulmonary function testing when you reach your target weight to decide which inhaled medications to use.  Follow with Dr Lamonte Sakai in 2 months or sooner if you have any problems

## 2014-10-04 NOTE — Progress Notes (Signed)
Subjective:    Patient ID: Meghan Hoover, female    DOB: [birth year], 54 y.o.   MRN: [id number]  Shortness of Breath Associated symptoms include leg swelling and rhinorrhea. Pertinent negatives include no ear pain, fever, headaches, rash, sore throat, vomiting or wheezing.   54 yo woman, former smoker (69 pk-yrs), OSA, HTN, GERD. Also with hx critical care illness due to cor pulmonale in setting of apparent LE cellulitis with intubation, trach, LTAC admission in 11/'14. She is referred today for dyspnea, possible COPD. She underwent PFT 03/12/13, shows mild to mod mixed disease, no BD response.  She has exertional SOB, some congestion. Coughs up phlegm regularly. Good CPAP compliance.   ROV 05/27/13 -- follows up for dyspnea, OSA/OHS, COPD based on mixed disease on spiro.  She did not desaturate but only walked one lap. We did trial Spiriva last time > she feels that she has benefited. Able to do more activity > she has started spring cleaning. Wears her CPAP mask reliably.   ROV 10/04/14 -- Follow-up visit for dyspnea, obstructive sleep apnea/obesity hyperventilation syndrome, and mixed disease on spirometry which we have ascribed to COPD (80-pack-year history). Since her last visit she has experienced one exacerbation proximally one year ago. Importantly she has also undergone gastric bypass surgery in June 2016. She has lost 79 lbs!  Her breathing has improved. Less albuterol use. She is compliant with BiPAP at 10/5. She needs her fluticasone nasal spray refilled  Review of Systems  Constitutional: Negative for fever and unexpected weight change.  HENT: Positive for congestion, postnasal drip, rhinorrhea and sinus pressure. Negative for dental problem, ear pain, nosebleeds, sneezing, sore throat and trouble swallowing.   Eyes: Negative for redness and itching.  Respiratory: Positive for cough and shortness of breath. Negative for chest tightness and wheezing.   Cardiovascular: Positive for leg  swelling. Negative for palpitations.  Gastrointestinal: Negative for nausea and vomiting.  Genitourinary: Negative for dysuria.  Musculoskeletal: Negative for joint swelling.  Skin: Negative for rash.  Neurological: Negative for headaches.  Hematological: Does not bruise/bleed easily.  Psychiatric/Behavioral: Negative for dysphoric mood. The patient is not nervous/anxious.         Objective:   Physical Exam.  Filed Vitals:   10/04/14 1511  BP: 118/70  Pulse: 72  Height: 5\' 8"  (1.727 m)  Weight: 312 lb (141.522 kg)  SpO2: 93%   Gen: Pleasant, obese woman, she has clearly lost weight, in no distress,  normal affect  ENT: No lesions,  mouth clear,  oropharynx clear, no postnasal drip  Neck: No JVD, no TMG, no carotid bruits, well healed trach scar  Lungs: No use of accessory muscles, small volumes, clear without rales or rhonchi  Cardiovascular: RRR, heart sounds normal, no murmur or gallops, no peripheral edema  Musculoskeletal: No deformities, no cyanosis or clubbing  Neuro: alert, non focal  Skin: Warm, no lesions or rashes      Assessment & Plan:  COPD (chronic obstructive pulmonary disease) She does have COPD with significant tobacco history but is not clear to me how much of her dyspnea is related to this versus her obesity. Clearly she is improved overall since her significant weight loss. I believe we should repeat her pulmonary function testing once she has reached her target weight and use this information and her exertional tolerance to decide whether to change her Spiriva to an alternative such as anoro  Allergic rhinitis Continue fluticasone  Obstructive sleep apnea Given her significant weight loss and  we will be appropriate to perform a CPAP auto titration study to see if we can change her pressure. Currently she is on BiPAP 10/5 (per her report)

## 2014-10-04 NOTE — Addendum Note (Signed)
Addended by: Desmond Dike C on: 10/04/2014 04:24 PM   Modules accepted: Orders

## 2014-10-04 NOTE — Assessment & Plan Note (Signed)
Given her significant weight loss and we will be appropriate to perform a CPAP auto titration study to see if we can change her pressure. Currently she is on BiPAP 10/5 (per her report)

## 2014-10-05 ENCOUNTER — Telehealth: Payer: Self-pay | Admitting: Emergency Medicine

## 2014-10-05 DIAGNOSIS — G4733 Obstructive sleep apnea (adult) (pediatric): Secondary | ICD-10-CM

## 2014-10-05 NOTE — Telephone Encounter (Signed)
Left message for Melissa to call back.  

## 2014-10-06 ENCOUNTER — Telehealth: Payer: Self-pay | Admitting: Emergency Medicine

## 2014-10-06 DIAGNOSIS — J309 Allergic rhinitis, unspecified: Secondary | ICD-10-CM

## 2014-10-06 MED ORDER — FLUTICASONE PROPIONATE 50 MCG/ACT NA SUSP: 2.0000 | Freq: Two times a day (BID) | NASAL | Status: DC

## 2014-10-06 NOTE — Telephone Encounter (Signed)
Spoke with pt and informed that refill for Flonase was sent to pharmacy.

## 2014-10-06 NOTE — Telephone Encounter (Signed)
Order placed on pt Set CPAP on auto titration for 2 weeks [facility] needs a pressure range you want to place pt on auto. Please advise RB thanks

## 2014-10-07 NOTE — Telephone Encounter (Signed)
Perform on CPAP 5 - 20 cmH2O

## 2014-10-07 NOTE — Telephone Encounter (Signed)
New order placed. Staff message sent to [facility]

## 2014-10-13 ENCOUNTER — Telehealth: Payer: Self-pay | Admitting: Emergency Medicine

## 2014-10-13 NOTE — Telephone Encounter (Signed)
Pt has a bipap. Machine. Pt insurance will not pay for a CPAP. They will place pt BIPAP on auto 5-20 cm w/ pressure support of 0 to make it function like a CPAP. When u get a reading though it will say BIPAP on the reading. RB aware and is fine with this. Nothing further needed

## 2014-10-15 ENCOUNTER — Other Ambulatory Visit: Payer: Self-pay | Admitting: Emergency Medicine

## 2014-10-18 ENCOUNTER — Other Ambulatory Visit: Payer: Self-pay | Admitting: Obstetrics & Gynecology

## 2014-10-18 ENCOUNTER — Ambulatory Visit (INDEPENDENT_AMBULATORY_CARE_PROVIDER_SITE_OTHER): Payer: BLUE CROSS/BLUE SHIELD | Admitting: Obstetrics & Gynecology

## 2014-10-18 ENCOUNTER — Other Ambulatory Visit (HOSPITAL_COMMUNITY)
Admission: RE | Admit: 2014-10-18 | Discharge: 2014-10-18 | Disposition: A | Discharge status: Home / Self Care | Payer: BLUE CROSS/BLUE SHIELD | Source: Ambulatory Visit | Attending: Obstetrics & Gynecology | Admitting: Obstetrics & Gynecology

## 2014-10-18 VITALS — BP 100/60 | HR 72 | Ht 68.0 in | Wt 324.0 lb

## 2014-10-18 DIAGNOSIS — Z01419 Encounter for gynecological examination (general) (routine) without abnormal findings: Secondary | ICD-10-CM | POA: Diagnosis present

## 2014-10-18 DIAGNOSIS — N841 Polyp of cervix uteri: Secondary | ICD-10-CM

## 2014-10-18 DIAGNOSIS — Z1151 Encounter for screening for human papillomavirus (HPV): Secondary | ICD-10-CM | POA: Diagnosis present

## 2014-10-18 NOTE — Progress Notes (Signed)
Patient ID: AJIAH MCGLINN, female   DOB: 1960/09/02, 54 y.o.   MRN: [id number] Subjective:     Meghan Hoover is a 54 y.o. female here for a routine exam.  No LMP recorded. Patient is postmenopausal. No obstetric history on file. Birth Control Method:  Post menopausal Menstrual Calendar(currently): amenorrhea  Current complaints: .   Current acute medical issues:  See below   Recent Gynecologic History No LMP recorded. Patient is postmenopausal. Last Pap: unsure,  normal Last mammogram: 8/16,  normal  Past Medical History  Diagnosis Date  . Hypertension   . GERD (gastroesophageal reflux disease)   . Heart failure   . COPD (chronic obstructive pulmonary disease)   . Hypothyroid   . CHF (congestive heart failure)   . Coronary artery disease   . Sleep apnea     uses CPAP  . Shortness of breath dyspnea     with exertion   . Pneumonia     2014  . Bronchitis     hx of   . Depression   . Anxiety   . History of frequent urinary tract infections     last one 10 years ago   . Urinary incontinence   . Bilateral lower extremity edema     hx of 2014   . Dysphagia   . Numbness     right hand since 2014    Past Surgical History  Procedure Laterality Date  . Achilles tendon surgery    . Back surgery      x4  . Tonsilectomy, adenoidectomy, bilateral myringotomy and tubes    . Tracheostomy      11/2012  . Tracheostomy closure      11/2012  . Colonoscopy N/A 05/24/2013    Procedure: COLONOSCOPY;  Surgeon: Daneil Dolin, MD;  Location: [facility];  Service: Endoscopy;  Laterality: N/A;  3:00 PM  . Gastrostomy w/ feeding tube      hx of 2014   . Swallowing evaluation       hx of   . Tonsillectomy    . Laparoscopic roux-en-y gastric bypass with upper endoscopy and removal of lap band N/A 06/27/2014    Procedure: LAPAROSCOPIC ROUX-EN-Y GASTRIC BYPASS   WITH UPPER ENDOSCOPY;  Surgeon: Johnathan Hausen, MD;  Location: [facility];  Service: General;  Laterality: N/A;  . Upper gi  endoscopy  06/27/2014    Procedure: UPPER GI ENDOSCOPY;  Surgeon: Johnathan Hausen, MD;  Location: [facility];  Service: General;;    OB History    No data available      Social History   Social History  . Marital Status: Married    Spouse Name: N/A  . Number of Children: N/A  . Years of Education: N/A   Occupational History  . retired     Therapist, art   Social History Main Topics  . Smoking status: Former Smoker -- 3.00 packs/day for 40 years    Types: Cigarettes    Start date: 01/22/1971    Quit date: 11/19/2012  . Smokeless tobacco: Never Used  . Alcohol Use: No     Comment: social drank quit 5 years ago   . Drug Use: No  . Sexual Activity: Not on file   Other Topics Concern  . Not on file   Social History Narrative    Family History  Problem Relation Age of Onset  . Diabetes Sister   . Emphysema Father   . Asthma Sister   . Heart  disease Father   . Heart disease Mother   . Cancer Father     lung  . Colon cancer Neg Hx      Current outpatient prescriptions:  .  Calcium Carb-Cholecalciferol (CALCIUM 600 + D PO), Take 1 tablet by mouth 3 (three) times daily., Disp: , Rfl:  .  Cholecalciferol (VITAMIN D3) 2000 UNITS TABS, Take 1 tablet by mouth every morning. , Disp: , Rfl:  .  Cyanocobalamin (VITAMIN B-12 CR PO), Take 1 tablet by mouth every morning. , Disp: , Rfl:  .  diltiazem (DILACOR XR) 180 MG 24 hr capsule, Take 1 capsule (180 mg total) by mouth daily. (Patient taking differently: Take 180 mg by mouth at bedtime. ), Disp: 90 capsule, Rfl: 3 .  diphenhydramine-acetaminophen (TYLENOL PM) 25-500 MG TABS, Take 1 tablet by mouth at bedtime as needed., Disp: , Rfl:  .  esomeprazole (NEXIUM) 40 MG capsule, Take 40 mg by mouth every morning. , Disp: , Rfl:  .  fluticasone (FLONASE) 50 MCG/ACT nasal spray, Place 2 sprays into both nostrils 2 (two) times daily., Disp: 48 g, Rfl: 3 .  levothyroxine (SYNTHROID, LEVOTHROID) 75 MCG tablet, Take 75 mcg by mouth daily  before breakfast., Disp: , Rfl:  .  lidocaine (LIDODERM) 5 %, Place 1-2 patches onto the skin daily. , Disp: , Rfl:  .  loratadine (CLARITIN) 10 MG tablet, Take 10 mg by mouth every morning. Alternates with Zyrtec., Disp: , Rfl:  .  losartan (COZAAR) 100 MG tablet, Place 1 tablet (100 mg total) into feeding tube daily. (Patient taking differently: Take 100 mg by mouth daily. ), Disp: , Rfl:  .  Multiple Vitamins-Minerals (MULTIVITAMIN WITH MINERALS) tablet, Take 1 tablet by mouth every morning. , Disp: , Rfl:  .  PAZEO 0.7 % SOLN, Place 1 drop into both eyes every morning., Disp: , Rfl: 8 .  Polyethyl Glycol-Propyl Glycol (SYSTANE OP), Apply 1 drop to eye daily as needed (dry eyes)., Disp: , Rfl:  .  SPIRIVA HANDIHALER 18 MCG inhalation capsule, PLACE 1 CAPSULE (18 MCG TOTAL) INTO INHALER AND INHALE DAILY., Disp: 30 capsule, Rfl: 2 .  torsemide (DEMADEX) 20 MG tablet, Take 1 tablet (20 mg total) by mouth 2 (two) times daily., Disp: 180 tablet, Rfl: 3 .  traMADol-acetaminophen (ULTRACET) 37.5-325 MG per tablet, Take 1 tablet by mouth 3 (three) times daily as needed., Disp: , Rfl: 0 .  traZODone (DESYREL) 50 MG tablet, Take 50 mg by mouth at bedtime., Disp: , Rfl:  .  albuterol (PROVENTIL HFA;VENTOLIN HFA) 108 (90 BASE) MCG/ACT inhaler, Inhale 2 puffs into the lungs every 6 (six) hours as needed for wheezing or shortness of breath. (Patient not taking: Reported on 10/18/2014), Disp: 1 Inhaler, Rfl: 6 .  colchicine 0.6 MG tablet, Take 1 tablet by mouth daily as needed., Disp: , Rfl:  .  SPIRIVA HANDIHALER 18 MCG inhalation capsule, PLACE 1 CAPSULE (18 MCG TOTAL) INTO INHALER AND INHALE DAILY. (Patient not taking: Reported on 10/18/2014), Disp: 90 capsule, Rfl: 3  Review of Systems  Review of Systems  Constitutional: Negative for fever, chills, weight loss, malaise/fatigue and diaphoresis.  HENT: Negative for hearing loss, ear pain, nosebleeds, congestion, sore throat, neck pain, tinnitus and ear  discharge.   Eyes: Negative for blurred vision, double vision, photophobia, pain, discharge and redness.  Respiratory: Negative for cough, hemoptysis, sputum production, shortness of breath, wheezing and stridor.   Cardiovascular: Negative for chest pain, palpitations, orthopnea, claudication, leg swelling and PND.  Gastrointestinal: negative for abdominal pain. Negative for heartburn, nausea, vomiting, diarrhea, constipation, blood in stool and melena.  Genitourinary: Negative for dysuria, urgency, frequency, hematuria and flank pain.  Musculoskeletal: Negative for myalgias, back pain, joint pain and falls.  Skin: Negative for itching and rash.  Neurological: Negative for dizziness, tingling, tremors, sensory change, speech change, focal weakness, seizures, loss of consciousness, weakness and headaches.  Endo/Heme/Allergies: Negative for environmental allergies and polydipsia. Does not bruise/bleed easily.  Psychiatric/Behavioral: Negative for depression, suicidal ideas, hallucinations, memory loss and substance abuse. The patient is not nervous/anxious and does not have insomnia.        Objective:  Blood pressure 100/60, pulse 72, height 5\' 8"  (1.727 m), weight 324 lb (146.965 kg).   Physical Exam  Vitals reviewed. Constitutional: She is oriented to person, place, and time. She appears well-developed and well-nourished.  HENT:  Head: Normocephalic and atraumatic.        Right Ear: External ear normal.  Left Ear: External ear normal.  Nose: Nose normal.  Mouth/Throat: Oropharynx is clear and moist.  Eyes: Conjunctivae and EOM are normal. Pupils are equal, round, and reactive to light. Right eye exhibits no discharge. Left eye exhibits no discharge. No scleral icterus.  Neck: Normal range of motion. Neck supple. No tracheal deviation present. No thyromegaly present.  Cardiovascular: Normal rate, regular rhythm, normal heart sounds and intact distal pulses.  Exam reveals no gallop and no  friction rub.   No murmur heard. Respiratory: Effort normal and breath sounds normal. No respiratory distress. She has no wheezes. She has no rales. She exhibits no tenderness.  GI: Soft. Bowel sounds are normal. She exhibits no distension and no mass. There is no tenderness. There is no rebound and no guarding.  Genitourinary:  Breasts no masses skin changes or nipple changes bilaterally      Vulva is normal without lesions Vagina is pink moist without discharge Cervix lare prolapsed polyp which was very difficult to remove pap is done Uterus is normal size shape and contour Adnexa is negative with normal sized ovaries  {Rectal    hemoccult negative, normal tone, no masses  Musculoskeletal: Normal range of motion. She exhibits no edema and no tenderness.  Neurological: She is alert and oriented to person, place, and time. She has normal reflexes. She displays normal reflexes. No cranial nerve deficit. She exhibits normal muscle tone. Coordination normal.  Skin: Skin is warm and dry. No rash noted. No erythema. No pallor.  Psychiatric: She has a normal mood and affect. Her behavior is normal. Judgment and thought content normal.       Assessment:    .   endocervical polyp Plan:    Follow up in: 1 week. no sex     Removal of endocervical polyp, sent to pathology will follo wup 1 week for results

## 2014-10-19 LAB — CYTOLOGY - PAP

## 2014-10-25 ENCOUNTER — Ambulatory Visit: Payer: BLUE CROSS/BLUE SHIELD | Admitting: Obstetrics & Gynecology

## 2014-12-06 ENCOUNTER — Ambulatory Visit: Payer: BLUE CROSS/BLUE SHIELD | Admitting: Emergency Medicine

## 2014-12-09 ENCOUNTER — Encounter: Payer: Self-pay | Admitting: Emergency Medicine

## 2014-12-09 ENCOUNTER — Ambulatory Visit (INDEPENDENT_AMBULATORY_CARE_PROVIDER_SITE_OTHER): Payer: BLUE CROSS/BLUE SHIELD | Admitting: Emergency Medicine

## 2014-12-09 VITALS — BP 108/76 | HR 67 | Ht 68.0 in | Wt 303.0 lb

## 2014-12-09 DIAGNOSIS — J449 Chronic obstructive pulmonary disease, unspecified: Secondary | ICD-10-CM

## 2014-12-09 DIAGNOSIS — G4733 Obstructive sleep apnea (adult) (pediatric): Secondary | ICD-10-CM

## 2014-12-09 NOTE — Patient Instructions (Addendum)
We will continue your CPAP on its current settings, auto-titration with a range of 5 - 20cm H2O.  Please continue your Spiriva daily Use albuterol as needed for shortness of breath.  We will consider repeating your breathing tests after you reach your target weight Follow with Dr Lamonte Sakai in 6 months or sooner if you have any problems

## 2014-12-09 NOTE — Assessment & Plan Note (Signed)
BiPAP auto titration study suggests that she has been stable and treated on straight CPAP at approximately 10 cm water. I'll continue her on the titration mode as her maintenance therapy

## 2014-12-09 NOTE — Progress Notes (Signed)
Subjective:    Patient ID: Meghan Hoover, female    DOB: October 18, 1960, 54 y.o.   MRN: [id number]  Shortness of Breath Associated symptoms include leg swelling and rhinorrhea. Pertinent negatives include no ear pain, fever, headaches, rash, sore throat, vomiting or wheezing.   54 yo woman, former smoker (18 pk-yrs), OSA, HTN, GERD. Also with hx critical care illness due to cor pulmonale in setting of apparent LE cellulitis with intubation, trach, LTAC admission in 11/'14. She is referred today for dyspnea, possible COPD. She underwent PFT 03/12/13, shows mild to mod mixed disease, no BD response.  She has exertional SOB, some congestion. Coughs up phlegm regularly. Good CPAP compliance.   ROV 05/27/13 -- follows up for dyspnea, OSA/OHS, COPD based on mixed disease on spiro.  She did not desaturate but only walked one lap. We did trial Spiriva last time > she feels that she has benefited. Able to do more activity > she has started spring cleaning. Wears her CPAP mask reliably.   ROV 10/04/14 -- Follow-up visit for dyspnea, obstructive sleep apnea/obesity hyperventilation syndrome, and mixed disease on spirometry which we have ascribed to COPD (80-pack-year history). Since her last visit she has experienced one exacerbation proximally one year ago. Importantly she has also undergone gastric bypass surgery in June 2016. She has lost 79 lbs!  Her breathing has improved. Less albuterol use. She is compliant with BiPAP at 10/5. She needs her fluticasone nasal spray refilled  ROV 12/09/14 -- follow-up for obstructive sleep apnea/obesity hypoventilation syndrome, COPD. He underwent a home BiPAP titration study, AutoSet titration that showed adequate control on CPAP 10. She rarely needs albuterol.   Review of Systems  Constitutional: Negative for fever and unexpected weight change.  HENT: Positive for congestion, postnasal drip, rhinorrhea and sinus pressure. Negative for dental problem, ear pain, nosebleeds,  sneezing, sore throat and trouble swallowing.   Eyes: Negative for redness and itching.  Respiratory: Positive for cough and shortness of breath. Negative for chest tightness and wheezing.   Cardiovascular: Positive for leg swelling. Negative for palpitations.  Gastrointestinal: Negative for nausea and vomiting.  Genitourinary: Negative for dysuria.  Musculoskeletal: Negative for joint swelling.  Skin: Negative for rash.  Neurological: Negative for headaches.  Hematological: Does not bruise/bleed easily.  Psychiatric/Behavioral: Negative for dysphoric mood. The patient is not nervous/anxious.        Objective:   Physical Exam.  Filed Vitals:   12/09/14 1403  BP: 108/76  Pulse: 67  Height: 5\' 8"  (1.727 m)  Weight: 303 lb (137.44 kg)  SpO2: 92%   Gen: Pleasant, obese woman, she has clearly lost weight, in no distress,  normal affect  ENT: No lesions,  mouth clear,  oropharynx clear, no postnasal drip  Neck: No JVD, no TMG, no carotid bruits, well healed trach scar  Lungs: No use of accessory muscles, small volumes, clear without rales or rhonchi  Cardiovascular: RRR, heart sounds normal, no murmur or gallops, no peripheral edema  Musculoskeletal: No deformities, no cyanosis or clubbing  Neuro: alert, non focal  Skin: Warm, no lesions or rashes      Assessment & Plan:  COPD (chronic obstructive pulmonary disease) Continue Spiriva for now. We will consider repeat spirometry or possibly a trial off of bronchodilators when she reaches her target weight.   Obstructive sleep apnea BiPAP auto titration study suggests that she has been stable and treated on straight CPAP at approximately 10 cm water. I'll continue her on the titration mode as her  maintenance therapy

## 2014-12-09 NOTE — Assessment & Plan Note (Signed)
Continue Spiriva for now. We will consider repeat spirometry or possibly a trial off of bronchodilators when she reaches her target weight.

## 2014-12-13 IMAGING — CR DG CHEST 1V PORT
1 series · 1 of 1 positions shown · non-contrast
Comparison: November 24, 2012.

CLINICAL DATA: Respiratory failure

EXAM:
PORTABLE CHEST - 1 VIEW

[portable]
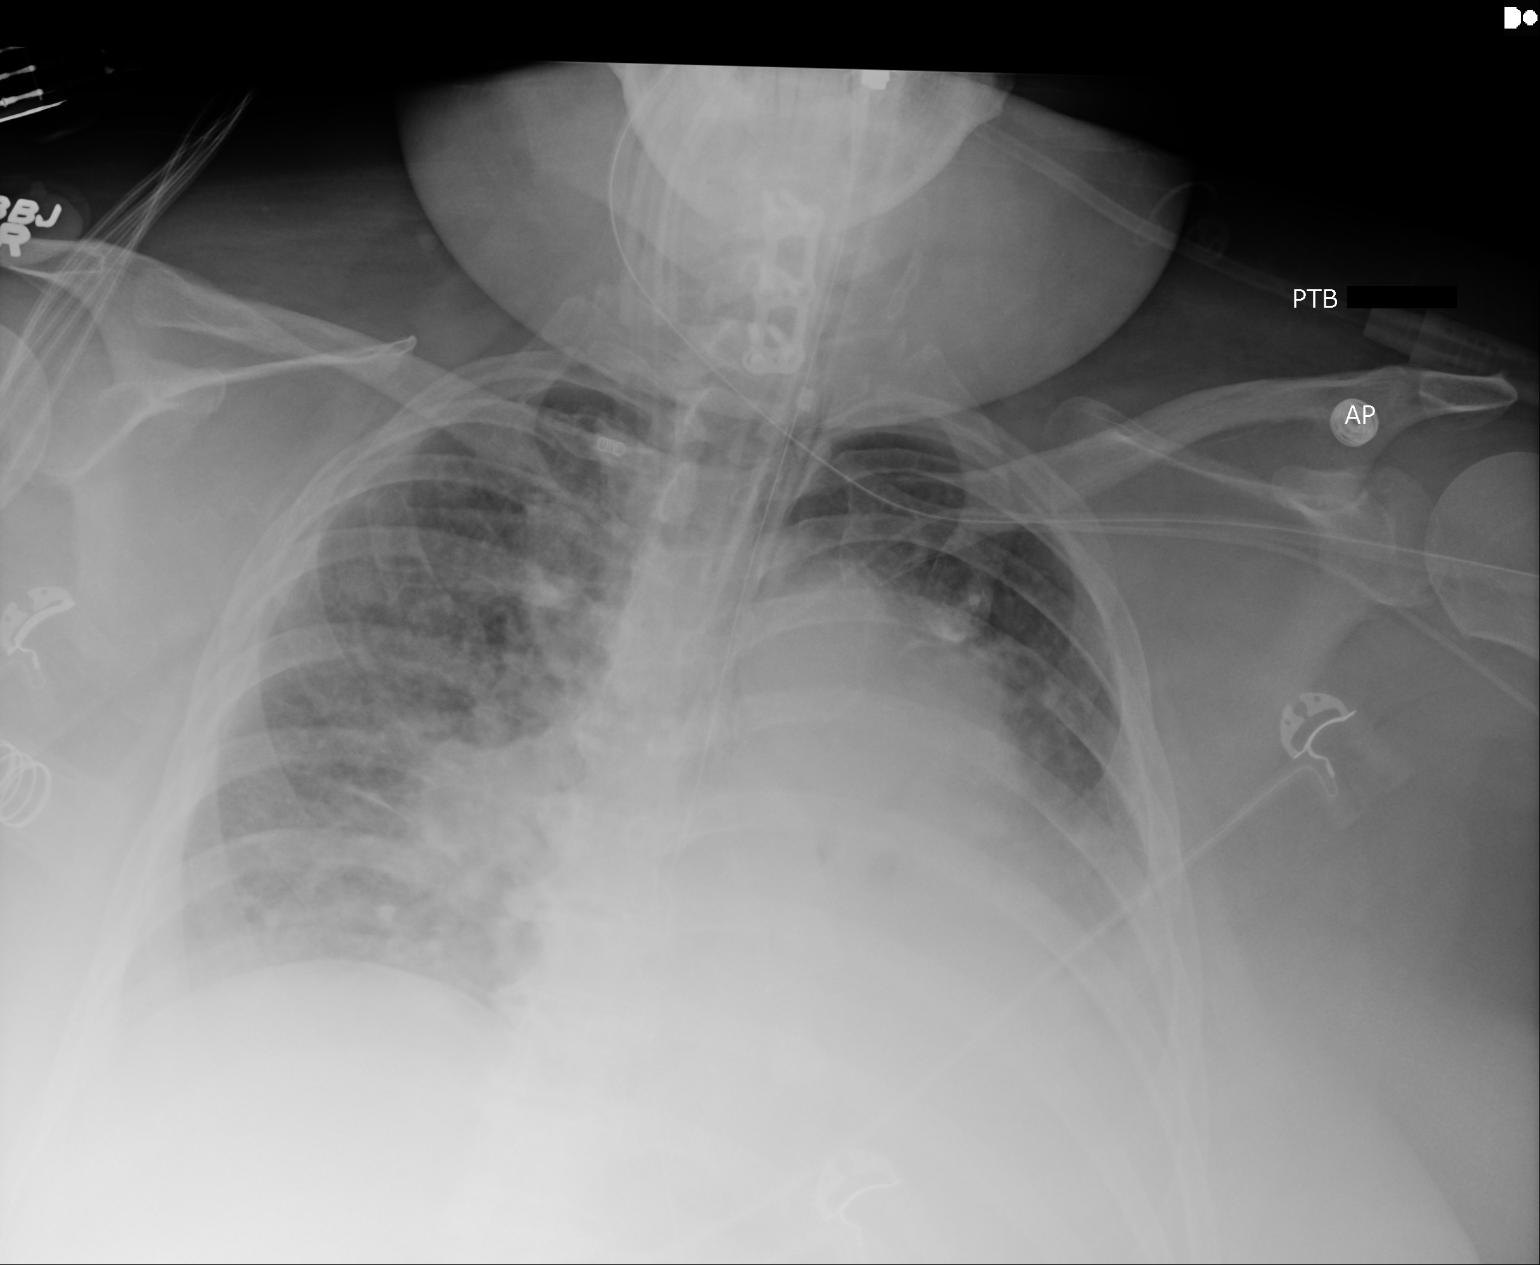

[1 of 1 positions shown; findings below may reference images not displayed]

FINDINGS: The lungs remain hypo inflated. The esophagogastric tube tip
projects off the film. An endotracheal tube is is felt to be present
with the tip 1 cm below the inferior margin of the clavicular heads.
A left PICC catheter is present with the tip in the region of the
proximal to mid SVC. The cardiac silhouette and aortic silhouette
remains prominent and it margin is indistinct. The left
hemidiaphragm remains obscured. Atelectatic change in the right
lower lung is less conspicuous today.
IMPRESSION: 1. The retrocardiac region remains quite dense consistent with
atelectasis and or pneumonia. Pleural fluid is not excluded.
2. The interstitial markings in the right lung remain increased but
some resolution of subsegmental atelectasis may be present.
3. The endotracheal tube tip appears to lie approximately 1 cm below
the inferior margin of the clavicular head.

## 2014-12-15 IMAGING — CR DG CHEST 1V PORT
1 series · 1 of 1 positions shown · non-contrast
Comparison: 11/26/2012.

CLINICAL DATA: Respiratory failure.

EXAM:
PORTABLE CHEST - 1 VIEW

[portable]
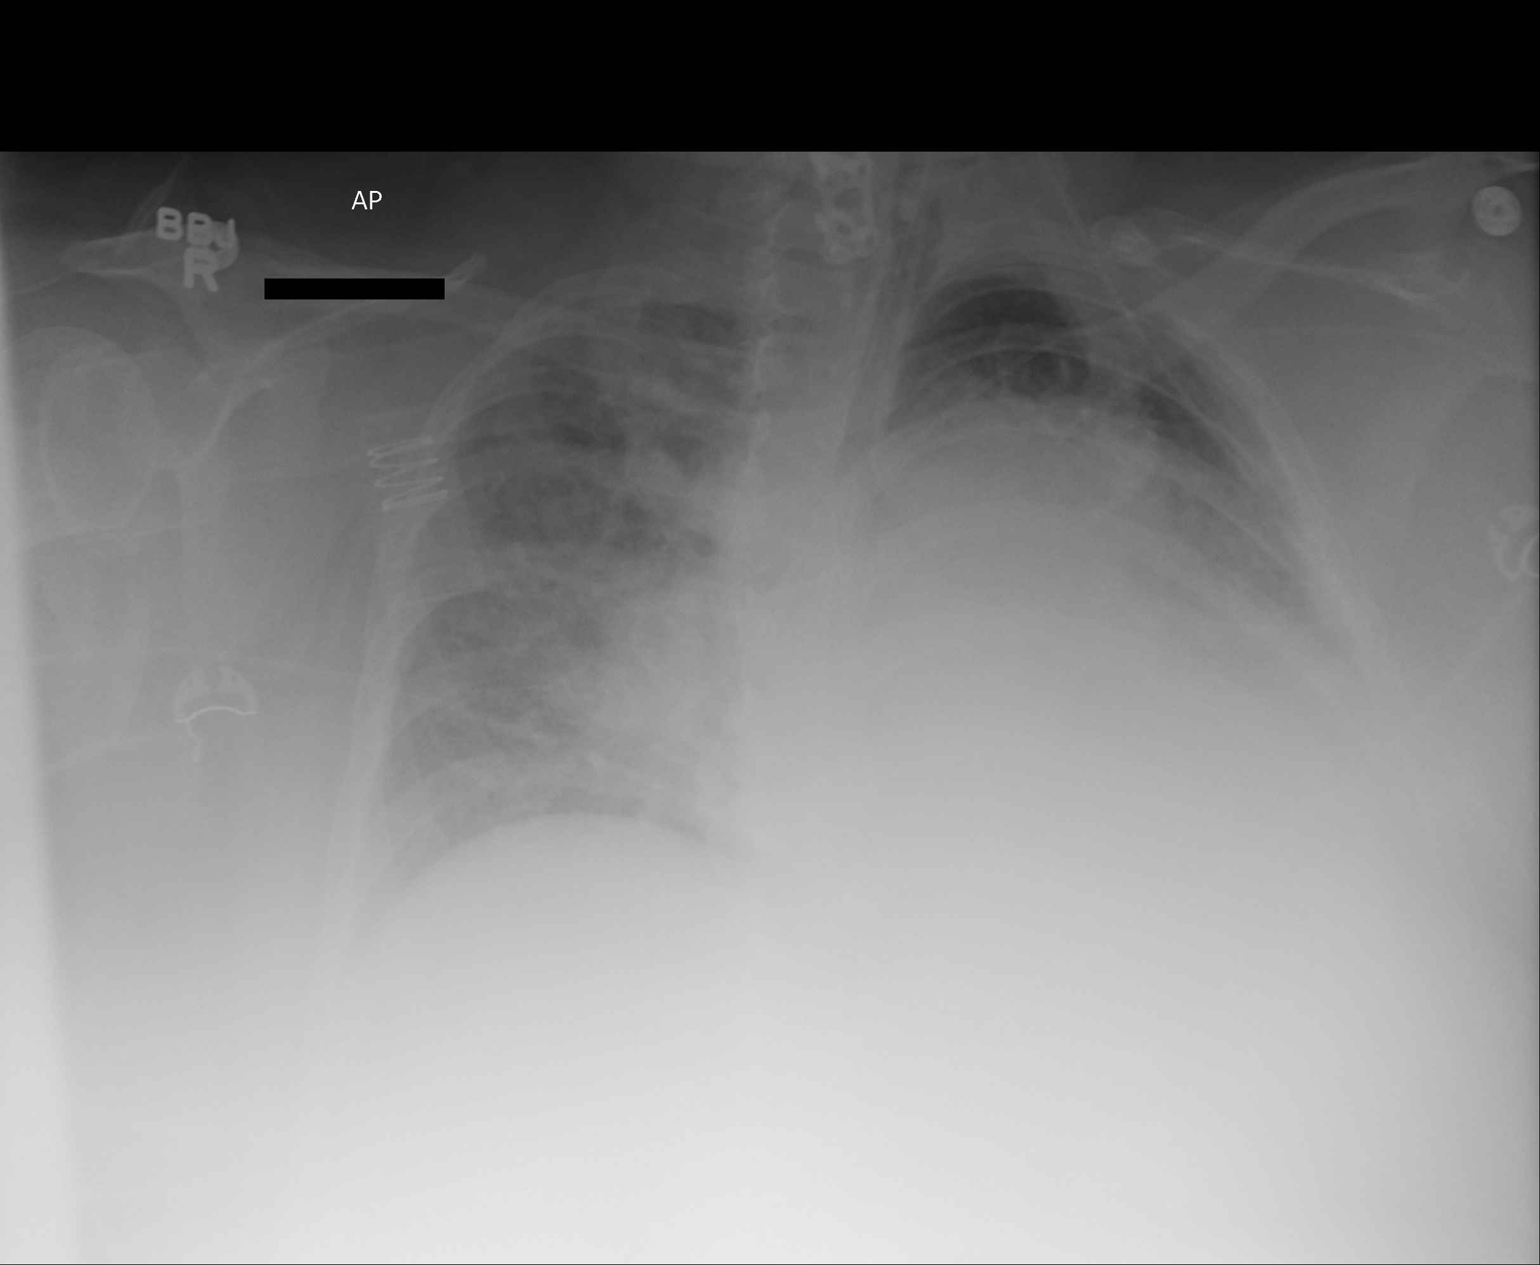

[1 of 1 positions shown; findings below may reference images not displayed]

FINDINGS: Exam is under penetrated due to body habitus and portable technique.
Left subclavian central line remains present with the tip not
visualized due to technique. There appears to be an endotracheal
tube and enteric tube which are poorly visualized. Lower lung
volumes with increasing perihilar airspace disease. Opacification of
the left hemithorax has increased, apparently Due to airspace
disease and atelectasis.
IMPRESSION: 1. Technically suboptimal exam due to underpenetration.
2. Lower lung volumes and worsening aeration.
3. Grossly stable support apparatus.

## 2014-12-16 IMAGING — CR DG CHEST 1V PORT
1 series · 1 of 1 positions shown · non-contrast
Comparison: 11/27/2012

CLINICAL DATA: Respiratory failure

EXAM:
PORTABLE CHEST - 1 VIEW

[portable]
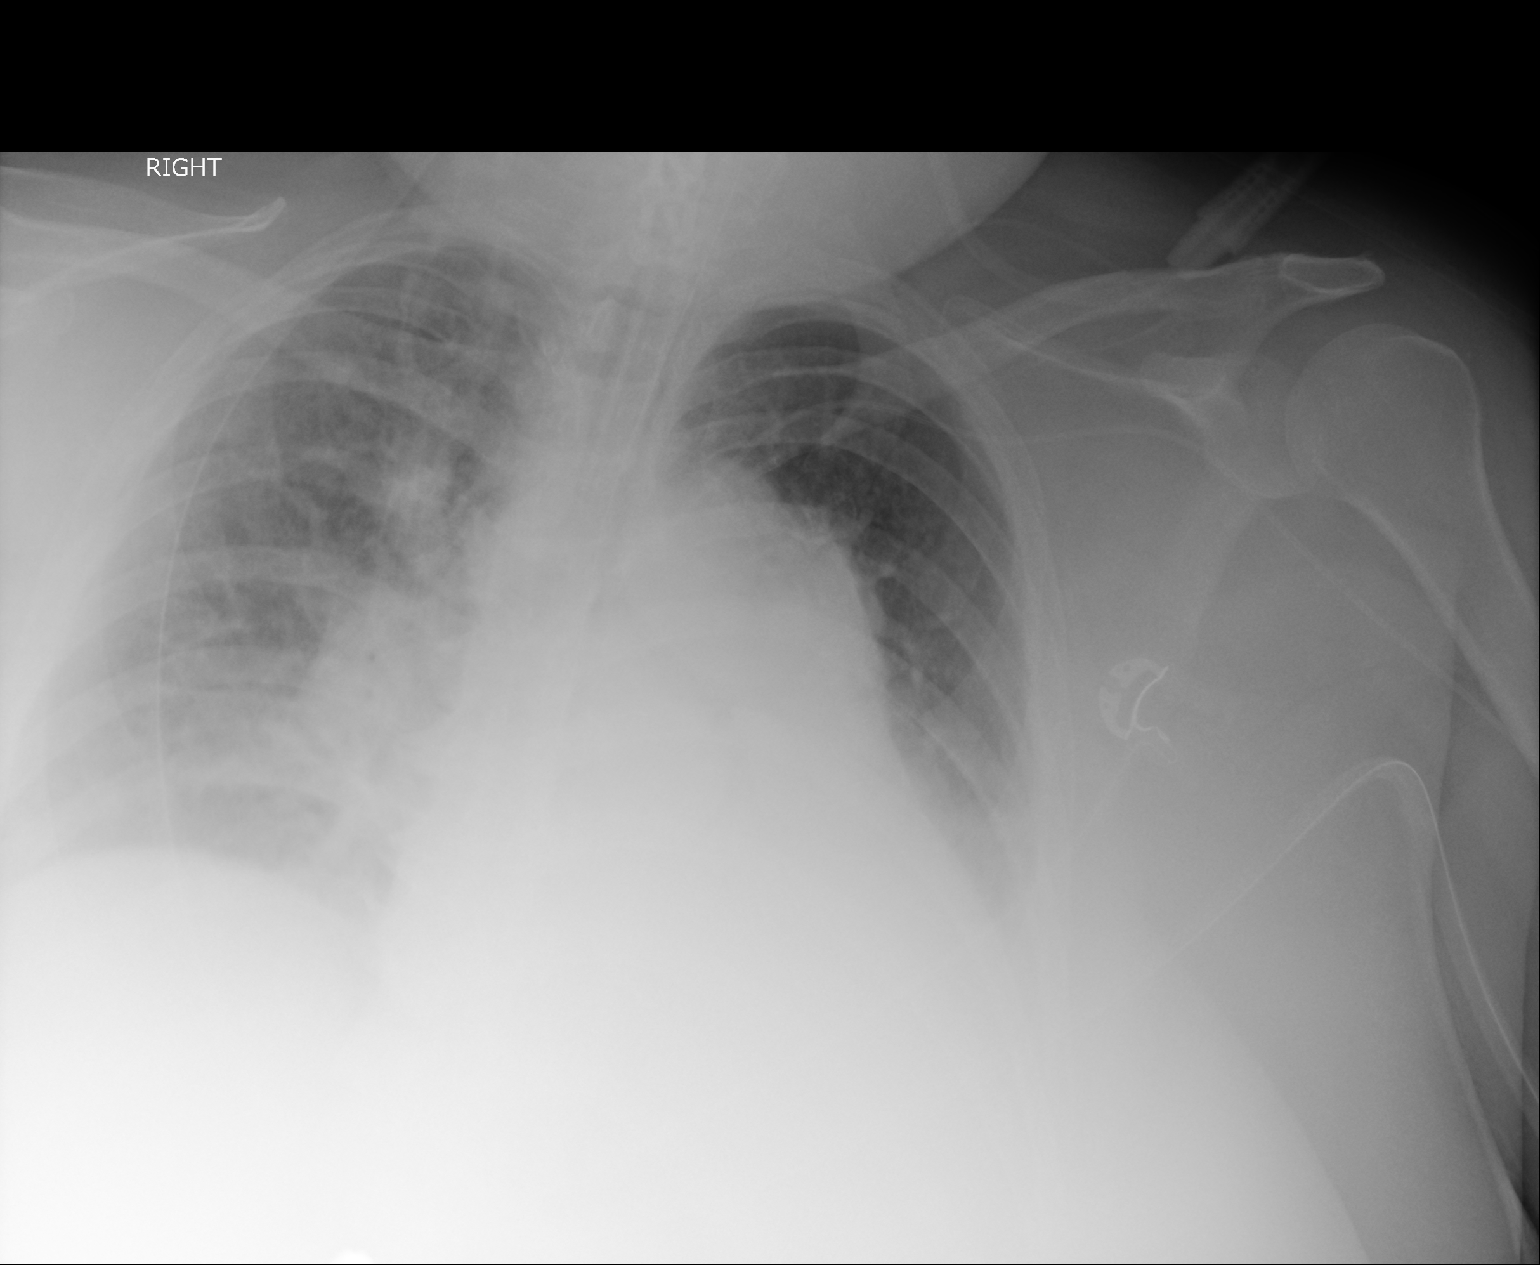

[1 of 1 positions shown; findings below may reference images not displayed]

FINDINGS: Suboptimal evaluation with under penetration due to body habitus and
portable technique.

Endotracheal tube is poorly visualized but likely terminates 4.3 cm
above the carina. Enteric tube is poorly visualized but likely
courses below the diaphragm. Left subclavian venous catheter is
poorly visualized but likely terminates in the lower SVC.

Increased interstitial markings, more prominently involving the
right upper lobe, possibly reflecting infection or asymmetric
pulmonary edema. This appearance grossly unchanged.

Poor visualization of the retrocardiac region. Possible small left
pleural effusion. No pneumothorax. .
IMPRESSION: Suboptimal evaluation.

Endotracheal tube is poorly visualized but likely terminates 4.3 cm
above the carina. Additional support apparatus are grossly
unchanged.

Increased interstitial markings, more prominently involving the
right upper lobe, possibly reflecting infection or asymmetric
pulmonary edema, grossly unchanged.

Poor visualization of the retrocardiac region. Possible small left
pleural effusion.

## 2014-12-18 IMAGING — CR DG CHEST 1V PORT
2 series · 4 of 4 positions shown · non-contrast
Comparison: 11/29/2012

CLINICAL DATA: Ventilator dependence.

EXAM:
PORTABLE CHEST - 1 VIEW

[Series 1: portable · 0.17mm/px · 2 of 2 slices shown (1 of 2)]
[im 1/2]
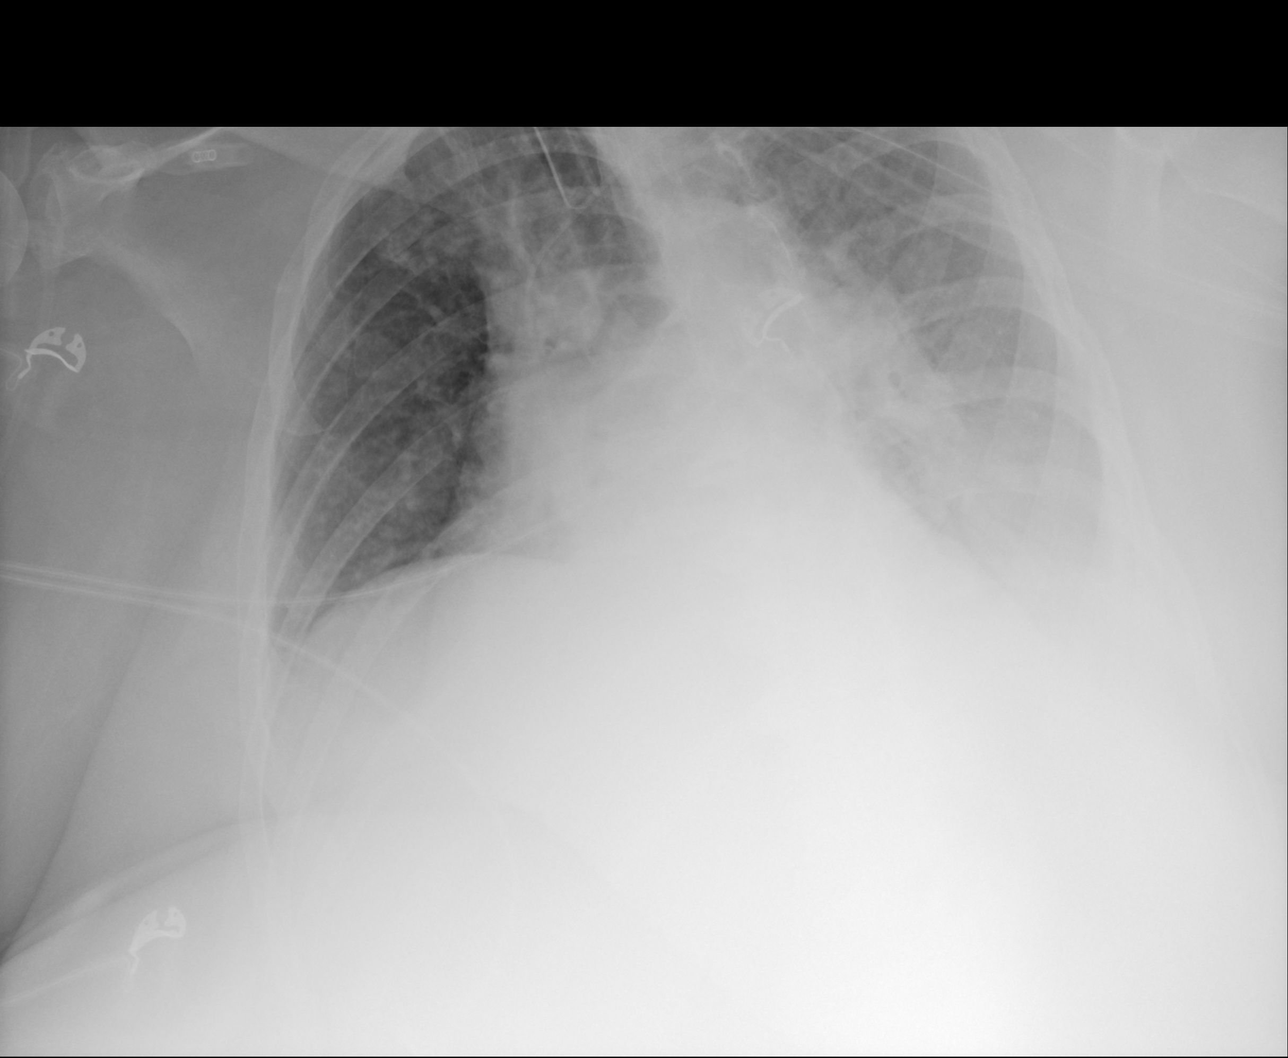
[im 2/2]
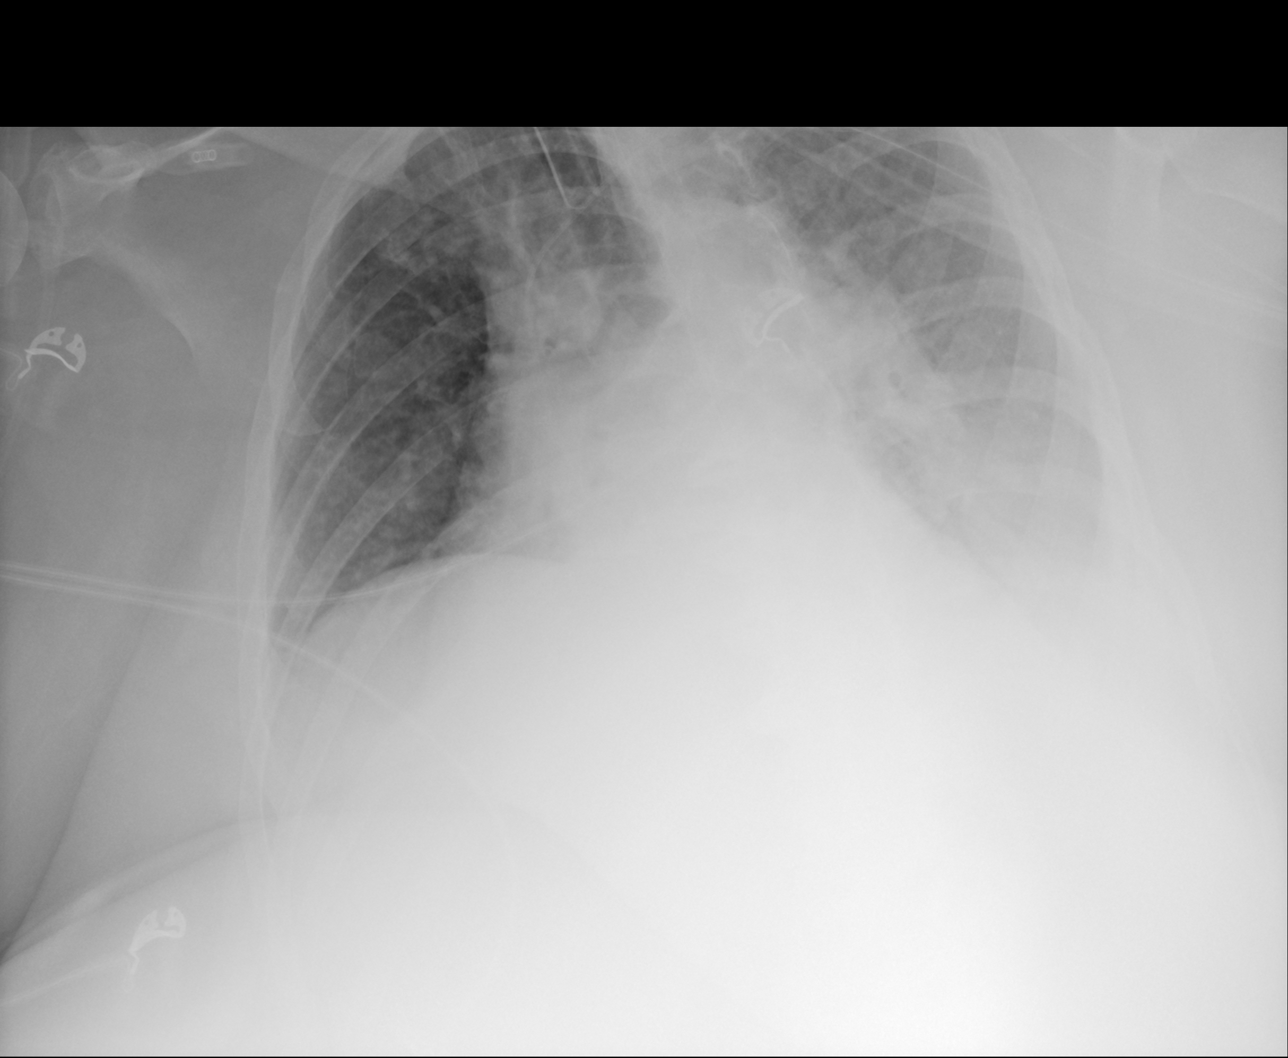

[Series 2: portable · 0.17mm/px · 2 of 2 slices shown (2 of 2)]
[im 1/2]
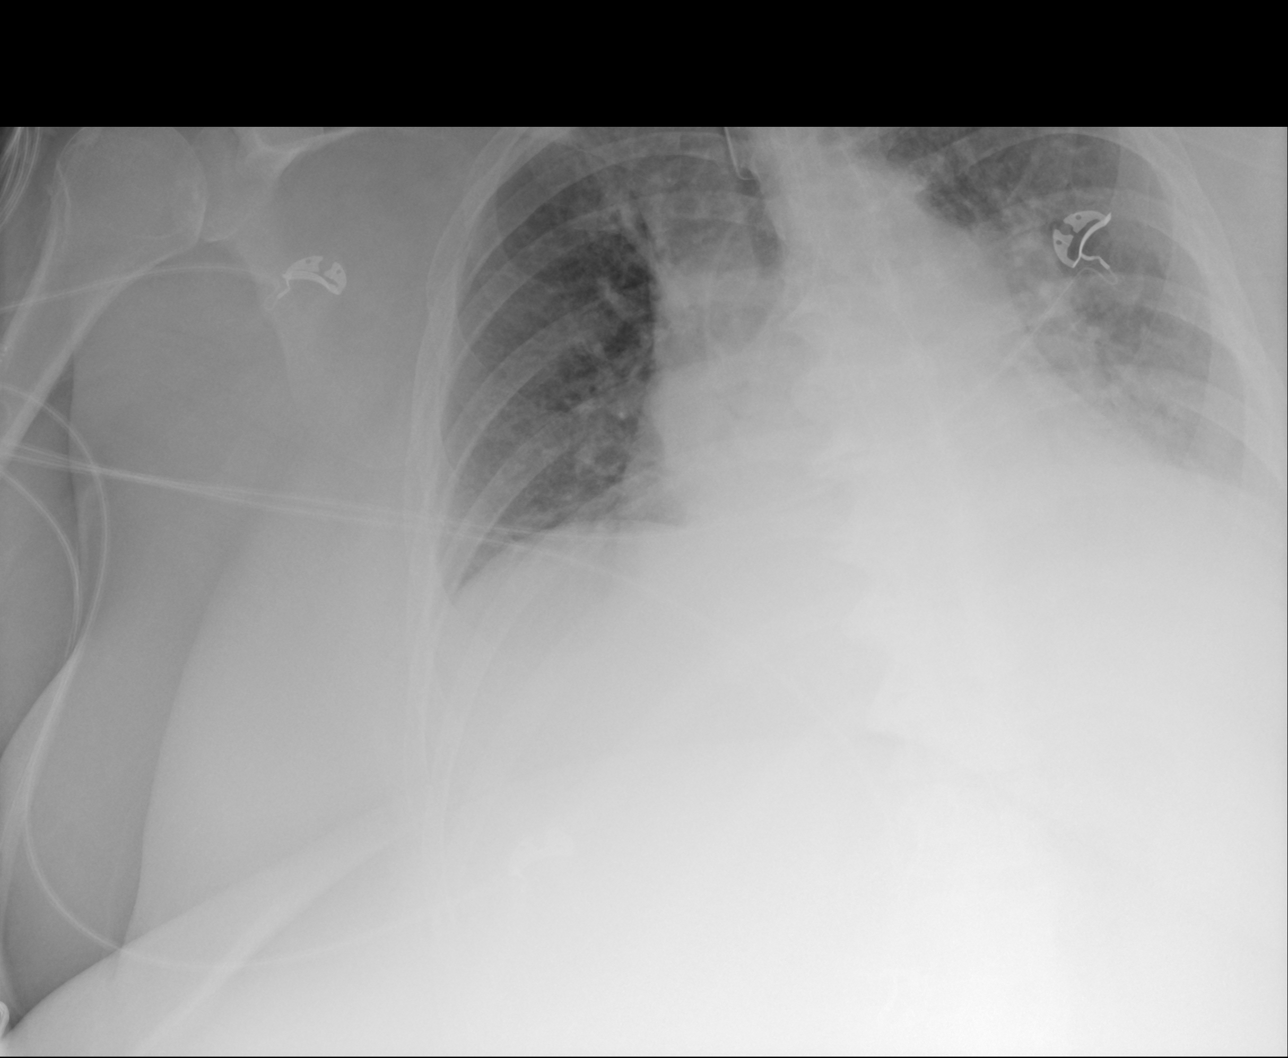
[im 2/2]
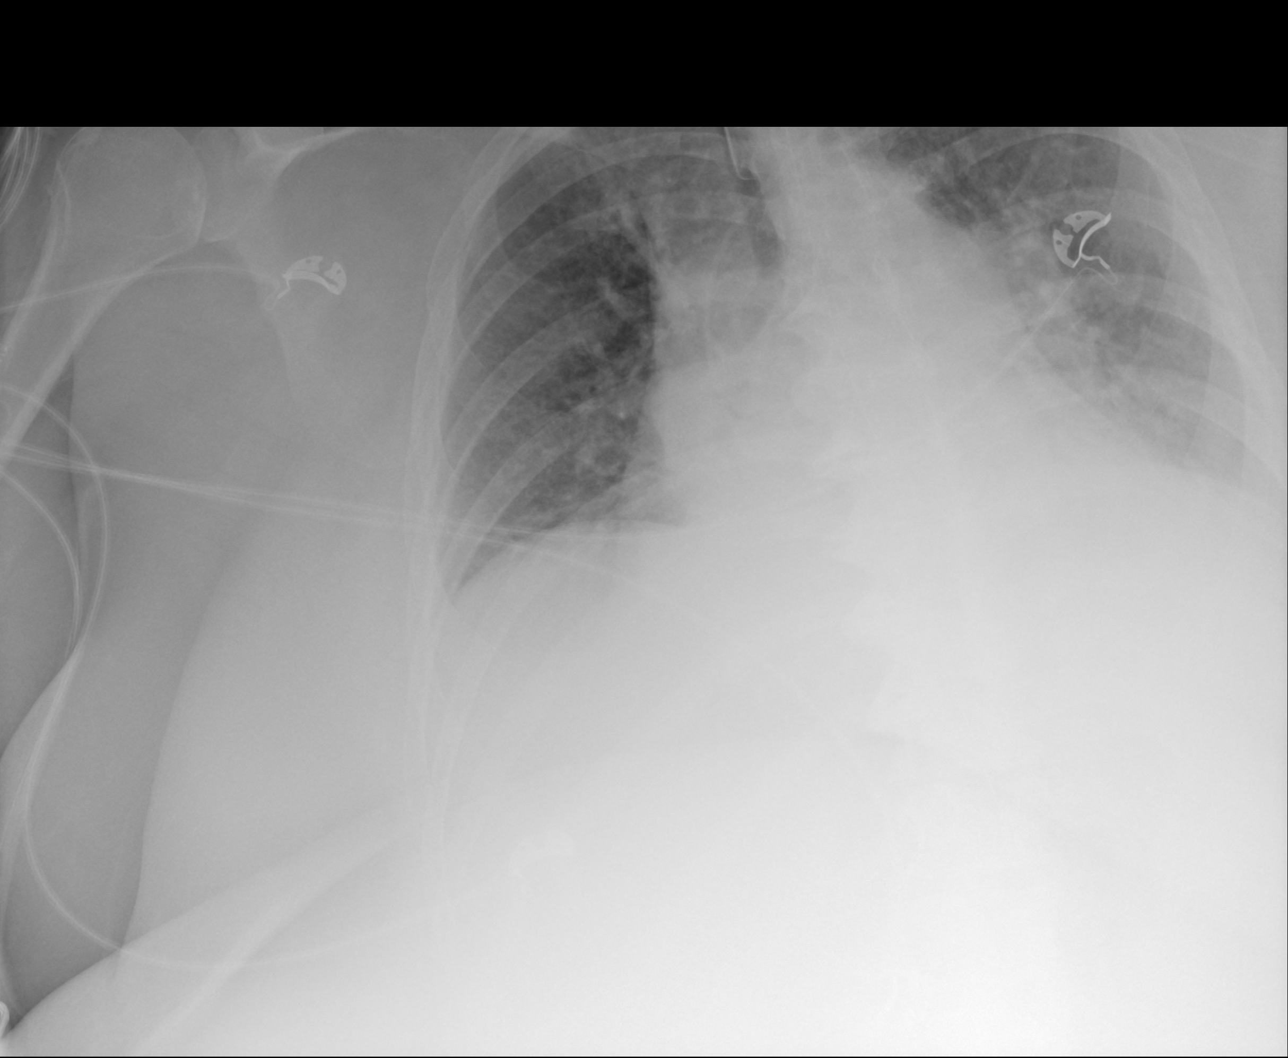

[4 of 4 positions shown; findings below may reference images not displayed]

FINDINGS: 3887 hr. Patient is rotated to the right. Retrieved tube tip is
approximately 4.5 cm above the base of the chronic. Lung volumes are
low. There is left base atelectasis or infiltrate with small left
pleural effusion. Vascular congestion noted without overt airspace
pulmonary edema. There appears to be an NG tube in place although it
is almost imperceptible against the mediastinum and upper abdomen.
It does appear did pass below the level of the esophagogastric
junction. A left sided PICC line tip overlies the mid SVC level
there is also difficult to discern against the background soft
tissues.
IMPRESSION: Cardiomegaly with low volumes and vascular congestion.

Left base atelectasis or infiltrate with left effusion.

Left PICC line and NG tube are not well seen.

## 2014-12-28 ENCOUNTER — Encounter: Payer: BLUE CROSS/BLUE SHIELD | Attending: Surgery | Admitting: Dietician

## 2014-12-28 ENCOUNTER — Encounter: Payer: Self-pay | Admitting: Dietician

## 2014-12-28 DIAGNOSIS — Z713 Dietary counseling and surveillance: Secondary | ICD-10-CM | POA: Insufficient documentation

## 2014-12-28 DIAGNOSIS — Z6841 Body Mass Index (BMI) 40.0 and over, adult: Secondary | ICD-10-CM | POA: Insufficient documentation

## 2014-12-28 NOTE — Patient Instructions (Addendum)
Goals: Follow Phase 3B: High Protein + Non-Starchy Vegetables Eat 3-6 small meals/snacks, every 3-5 hrs Increase lean protein foods to meet 64g goal Increase fluid intake to 64oz + Avoid drinking 15 minutes before, during and 30 minutes after eating Aim for >30 min of physical activity daily Continue to keep tempting foods out of the house Eat protein first, then vegetables, and then carbs/fruit (have a taste) Continue to work on emotional eating Try oatmeal with protein powder a few times per week  If you want nuts, have a small palm full (10-15 nuts)  Surgery date:  06/27/14 Surgery type: RYGB Start weight at [facility]: 395 lbs on 03/22/14, 406.5 on 5/16 Weight today: 301.5  lbs  Weight change: 32 lbs Total weight loss: 105 lbs  TANITA  BODY COMP RESULTS  06/06/14 07/12/14 08/23/14 09/29/14 12/28/14   BMI (kg/m^2) 61.8 57.2 53.4 50.7 45.8   Fat Mass (lbs) 214.5 209.0 179.0 164.0 138   Fat Free Mass (lbs) 192 167.0 172.0 169.5 163.0   Total Body Water (lbs) 140.5 122.0 126.0 124.0 119.5

## 2014-12-28 NOTE — Progress Notes (Signed)
Follow-up visit: 6 Months Post-Operative RYGB Surgery  Medical Nutrition Therapy:  Appt start time: [id number] end time:  1230  Primary concerns today: Post-operative Bariatric Surgery Nutrition Management. Returns with a 32 lbs weight loss. Feels like weight loss is slowing down weight. Mother had knee surgery and rehab starting in early October. Has been having to take care of her and then starting "cheating" in November but was able to get back to regular eating. Felt like she handled her stressful/emotional situations as best as can be expected.   No longer having toenail fungus and got her first pedicure.  Not feeling much "stomach" hunger but has been dealing with "brain" hunger.   Surgery date:  06/27/14 Surgery type: RYGB Start weight at [facility]: 395 lbs on 03/22/14, 406.5 on 5/16 Weight today: 301.5  lbs  Weight change: 32 lbs Total weight loss: 105 lbs Weight loss goal: 220 lbs  TANITA  BODY COMP RESULTS  06/06/14 07/12/14 08/23/14 09/29/14 12/28/14   BMI (kg/m^2) 61.8 57.2 53.4 50.7 45.8   Fat Mass (lbs) 214.5 209.0 179.0 164.0 138   Fat Free Mass (lbs) 192 167.0 172.0 169.5 163.0   Total Body Water (lbs) 140.5 122.0 126.0 124.0 119.5     Preferred Learning Style:   No preference indicated   Learning Readiness:   Ready  24-hr recall: B (AM): Premier Protein shake or egg with [place] bacon on weekend or yogurt (14-30 g) Snk (AM): Premier Protein shake or  egg with [place] bacon on weekend or yogurt (14-30 g) (flip flops between breakfast and morning snack) L (PM): 2.5 oz cheese stick with lunch meat or turkey/beef burger with mustard or chicken hot dog or salad with meat (18 g) Snk (PM): salad or green beans or greens  D (PM): 2.5 oz fish/crab or chicken or [place] meatloaf (18 g) Snk (PM): salad or green beans or greens   Fluid intake: protein shake, water, unsweet decaf tea (at least 64 oz fluid), 7.5 oz diet pepsi every other day Estimated total protein intake: around 66  g  Medications: see list  Supplementation: taking  Using straws: No, except at restaurant Drinking while eating: might take sips if having something spicy Hair loss: Yes, taking biotin  Carbonated beverages: 7.5 oz Diet Pepsi every other day N/V/D/C: nausea and vomiting if she overeats, constipated one time  Dumping syndrome: None  Recent physical activity:  Walking 60 minutes each day  Progress Towards Goal(s):  In progress.  Handouts given during visit include:  Phase 3B High Protein + Non Starchy Vegetables   Nutritional Diagnosis:  Foard-3.3 Overweight/obesity related to past poor dietary habits and physical inactivity as evidenced by patient w/ recent RYGB surgery following dietary guidelines for continued weight loss.    Intervention:  Nutrition education/diet reinforcement Goals: Follow Phase 3B: High Protein + Non-Starchy Vegetables Eat 3-6 small meals/snacks, every 3-5 hrs Increase lean protein foods to meet 64g goal Increase fluid intake to 64oz + Avoid drinking 15 minutes before, during and 30 minutes after eating Aim for >30 min of physical activity daily Continue to keep tempting foods out of the house Try Quest protein chips or Parmesan Crisps from [employer] protein first, then vegetables, and then carbs (sweet potato)  Teaching Method Utilized:  Visual Auditory Hands on  Barriers to learning/adherence to lifestyle change: none  Demonstrated degree of understanding via:  Teach Back   Monitoring/Evaluation:  Dietary intake, exercise, and body weight. Follow up in 6 months for 12 month post-op  visit.

## 2015-01-03 ENCOUNTER — Ambulatory Visit (HOSPITAL_COMMUNITY)
Admission: RE | Admit: 2015-01-03 | Discharge: 2015-01-03 | Disposition: A | Discharge status: Home / Self Care | Payer: BLUE CROSS/BLUE SHIELD | Source: Ambulatory Visit | Attending: Family Medicine | Admitting: Family Medicine

## 2015-01-03 ENCOUNTER — Other Ambulatory Visit (HOSPITAL_COMMUNITY): Payer: Self-pay | Admitting: Family Medicine

## 2015-01-03 DIAGNOSIS — M25571 Pain in right ankle and joints of right foot: Secondary | ICD-10-CM | POA: Diagnosis not present

## 2015-02-27 ENCOUNTER — Encounter: Payer: Self-pay | Admitting: Cardiology

## 2015-02-27 ENCOUNTER — Ambulatory Visit (INDEPENDENT_AMBULATORY_CARE_PROVIDER_SITE_OTHER): Payer: BLUE CROSS/BLUE SHIELD | Admitting: Cardiology

## 2015-02-27 VITALS — BP 126/76 | HR 71 | Ht 68.0 in | Wt 293.0 lb

## 2015-02-27 DIAGNOSIS — I10 Essential (primary) hypertension: Secondary | ICD-10-CM | POA: Diagnosis not present

## 2015-02-27 DIAGNOSIS — I5032 Chronic diastolic (congestive) heart failure: Secondary | ICD-10-CM

## 2015-02-27 NOTE — Progress Notes (Signed)
Patient ID: JANUARY ALSBROOK, female   DOB: [birth year], 55 y.o.   MRN: [id number]     Clinical Summary Ms. Swaggerty is a 55 y.o.female seen today for follow up of the following medical problems.   1. Chronic diastolic heart failure - denies any SOB or DOE. No significant LE edema - since her bariatric surgery and significant weight loss her fluid status and breathing symptoms have significaintly improved.   - no LE edema. No significant SOB or DOE. Taking torsemide once daily.   2. OSA  - compliant with CPAP machine.   3. HTN - compliant with meds - checks at home daily, typically 110s/60s  4. Obesity - s/p gastric bypass 06/2014. Continues to lose weight since surgery, down another 50 lbs since our last visit. .   5. COPD - followed by pulmonary   Past Medical History  Diagnosis Date  . Hypertension   . GERD (gastroesophageal reflux disease)   . Heart failure ([place])   . COPD (chronic obstructive pulmonary disease) ([place])   . Hypothyroid   . CHF (congestive heart failure) ([place])   . Coronary artery disease   . Sleep apnea     uses CPAP  . Shortness of breath dyspnea     with exertion   . Pneumonia     2014  . Bronchitis     hx of   . Depression   . Anxiety   . History of frequent urinary tract infections     last one 10 years ago   . Urinary incontinence   . Bilateral lower extremity edema     hx of 2014   . Dysphagia   . Numbness     right hand since 2014     Allergies  Allergen Reactions  . Augmentin [Amoxicillin-Pot Clavulanate] Other (See Comments)    thrush     Current Outpatient Prescriptions  Medication Sig Dispense Refill  . albuterol (PROVENTIL HFA;VENTOLIN HFA) 108 (90 BASE) MCG/ACT inhaler Inhale 2 puffs into the lungs every 6 (six) hours as needed for wheezing or shortness of breath. 1 Inhaler 6  . Biotin (BIOTIN MAXIMUM STRENGTH) 10 MG TABS Take by mouth.    . Calcium Carb-Cholecalciferol (CALCIUM 600 + D PO) Take 1 tablet by mouth 3 (three)  times daily.    . Cholecalciferol (VITAMIN D3) 2000 UNITS TABS Take 1 tablet by mouth every morning.     . colchicine 0.6 MG tablet Take 1 tablet by mouth daily as needed.    . Cyanocobalamin (VITAMIN B-12 CR PO) Take 1 tablet by mouth every morning.     . diltiazem (DILACOR XR) 180 MG 24 hr capsule Take 1 capsule (180 mg total) by mouth daily. (Patient taking differently: Take 180 mg by mouth at bedtime. ) 90 capsule 3  . diphenhydramine-acetaminophen (TYLENOL PM) 25-500 MG TABS Take 1 tablet by mouth at bedtime as needed.    Marland Kitchen esomeprazole (NEXIUM) 40 MG capsule Take 40 mg by mouth every morning.     . fluticasone (FLONASE) 50 MCG/ACT nasal spray Place 2 sprays into both nostrils 2 (two) times daily. 48 g 3  . levothyroxine (SYNTHROID, LEVOTHROID) 75 MCG tablet Take 75 mcg by mouth daily before breakfast.    . lidocaine (LIDODERM) 5 % Place 1-2 patches onto the skin daily.     Marland Kitchen loratadine (CLARITIN) 10 MG tablet Take 10 mg by mouth every morning. Alternates with Zyrtec.    Marland Kitchen losartan (COZAAR) 100 MG tablet Place 1 tablet (  100 mg total) into feeding tube daily. (Patient taking differently: Take 100 mg by mouth daily. )    . Multiple Vitamins-Minerals (MULTIVITAMIN WITH MINERALS) tablet Take 1 tablet by mouth every morning.     Marland Kitchen PAZEO 0.7 % SOLN Place 1 drop into both eyes every morning.  8  . Polyethyl Glycol-Propyl Glycol (SYSTANE OP) Apply 1 drop to eye daily as needed (dry eyes).    . SPIRIVA HANDIHALER 18 MCG inhalation capsule PLACE 1 CAPSULE (18 MCG TOTAL) INTO INHALER AND INHALE DAILY. 30 capsule 2  . SPIRIVA HANDIHALER 18 MCG inhalation capsule PLACE 1 CAPSULE (18 MCG TOTAL) INTO INHALER AND INHALE DAILY. 90 capsule 3  . torsemide (DEMADEX) 20 MG tablet Take 1 tablet (20 mg total) by mouth 2 (two) times daily. 180 tablet 3  . traMADol-acetaminophen (ULTRACET) 37.5-325 MG per tablet Take 1 tablet by mouth 3 (three) times daily as needed.  0  . traZODone (DESYREL) 50 MG tablet Take 50 mg  by mouth at bedtime.     No current facility-administered medications for this visit.     Past Surgical History  Procedure Laterality Date  . Achilles tendon surgery    . Back surgery      x4  . Tonsilectomy, adenoidectomy, bilateral myringotomy and tubes    . Tracheostomy      11/2012  . Tracheostomy closure      11/2012  . Colonoscopy N/A 05/24/2013    Procedure: COLONOSCOPY;  Surgeon: Daneil Dolin, MD;  Location: [facility];  Service: Endoscopy;  Laterality: N/A;  3:00 PM  . Gastrostomy w/ feeding tube      hx of 2014   . Swallowing evaluation       hx of   . Tonsillectomy    . Laparoscopic roux-en-y gastric bypass with upper endoscopy and removal of lap band N/A 06/27/2014    Procedure: LAPAROSCOPIC ROUX-EN-Y GASTRIC BYPASS   WITH UPPER ENDOSCOPY;  Surgeon: Johnathan Hausen, MD;  Location: [facility];  Service: General;  Laterality: N/A;  . Upper gi endoscopy  06/27/2014    Procedure: UPPER GI ENDOSCOPY;  Surgeon: Johnathan Hausen, MD;  Location: [facility];  Service: General;;     Allergies  Allergen Reactions  . Augmentin [Amoxicillin-Pot Clavulanate] Other (See Comments)    thrush      Family History  Problem Relation Age of Onset  . Diabetes Sister   . Emphysema Father   . Asthma Sister   . Heart disease Father   . Heart disease Mother   . Cancer Father     lung  . Colon cancer Neg Hx      Social History Ms. Novitsky reports that she quit smoking about 2 years ago. Her smoking use included Cigarettes. She started smoking about 44 years ago. She has a 120 pack-year smoking history. She has never used smokeless tobacco. Ms. Keaveney reports that she does not drink alcohol.   Review of Systems CONSTITUTIONAL: No weight loss, fever, chills, weakness or fatigue.  HEENT: Eyes: No visual loss, blurred vision, double vision or yellow sclerae.No hearing loss, sneezing, congestion, runny nose or sore throat.  SKIN: No rash or itching.  CARDIOVASCULAR: no chest pain, no  palpitations.  RESPIRATORY: No shortness of breath, cough or sputum.  GASTROINTESTINAL: No anorexia, nausea, vomiting or diarrhea. No abdominal pain or blood.  GENITOURINARY: No burning on urination, no polyuria NEUROLOGICAL: No headache, dizziness, syncope, paralysis, ataxia, numbness or tingling in the extremities. No change in bowel or  bladder control.  MUSCULOSKELETAL: No muscle, back pain, joint pain or stiffness.  LYMPHATICS: No enlarged nodes. No history of splenectomy.  PSYCHIATRIC: No history of depression or anxiety.  ENDOCRINOLOGIC: No reports of sweating, cold or heat intolerance. No polyuria or polydipsia.  Marland Kitchen   Physical Examination Filed Vitals:   02/27/15 1143  BP: 126/76  Pulse: 71   Filed Vitals:   02/27/15 1143  Height: 5\' 8"  (1.727 m)  Weight: 293 lb (132.904 kg)    Gen: resting comfortably, no acute distress HEENT: no scleral icterus, pupils equal round and reactive, no palptable cervical adenopathy,  CV: RRR, no m/r/g, no jvd Resp: Clear to auscultation bilaterally GI: abdomen is soft, non-tender, non-distended, normal bowel sounds, no hepatosplenomegaly MSK: extremities are warm, no edema.  Skin: warm, no rash Neuro:  no focal deficits Psych: appropriate affect   Diagnostic Studies  06/2013 Exercise MPI Baseline tracing shows sinus rhythm at 91 beats per min with poor weight progression. Patient was exercised by Bruce protocol for 2 min and 46 seconds achieving a maximum work load of 4.6 METS. Heart rate increased from 94 beats per min up to 146 beats per min which was 87% of the maximum age predicted heart rate. Blood pressure increased from 154/76 up to 200/72. No chest pain rule was reported, however exercise was limited by shortness of breath and fatigue. There were no clearly diagnostic ST segment abnormalities, and no arrhythmias were noted.  Analysis of the overall perfusion data finds somewhat heterogeneous radiotracer uptake with evidence  of breast attenuation.  Tomographic views were obtained using the short axis, vertical long axis, and horizontal long axis planes. There is a small, mild intensity basal anterior defect that is fixed and consistent with soft tissue attenuation. There is also a small, moderate intensity inferior wall defect that is generally more prominent at rest than stress, overall fixed with partial reversibility in the mid inferoseptal and inferolateral segment. Suspect that this is related to variable diaphragmatic attenuation, however cannot completely exclude a mild degree of ischemia.  Gated imaging reveals an EDV of 109, ESV of 20, TID ratio 0.69, and LVEF of 75% with normal wall motion.  IMPRESSION: Low risk exercise Cardiolite as outlined. Patient had limited exercise tolerance due to shortness of breath and fatigue, achieved a maximum work load of 4.6 METS. There were no diagnostic ST segment abnormalities or arrhythmias. Perfusion imaging is most consistent with breast tissue attenuation as well as variable diaphragmatic attenuation, cannot completely exclude a mild degree of inferior ischemia as outlined. LVEF is vigorous at 75% with normal volumes and wall motion.  12/04/2012 Echo  Technically difficult study, LVEF 0000000, grade I diastolic dysfunction, mild AS, cannot estimate PASP, normal IVC, normal RV   11/24/12 Echo  LVEF 55-60%, moderate RV dilatation with normal function   11/20/12 Echo  LVEF 65-70%, grade II diastolic dysfunction, flattened RV septum in diastole and systole, severe RV dilatatoin, PASP 34mmHg   02/2013 PFTs Obstruction and diffusion defect suggesting emphysema, however absence of hyperinflaction. Overall minimal obstructive disease.   Assessment and Plan   1. Chronic Diastolic heart failure  - no current symptoms, continue current diuretic.   2. OSA - continue CPAP  3. HTN - at goal, we will continue current meds  4. Obesity - s/p  gastric bypass with significant weight loss - continues to make great progress.   F/u 1 year. Request pcp labs.   Arnoldo Lenis, M.D

## 2015-02-27 NOTE — Patient Instructions (Signed)
Medication Instructions:  Your physician recommends that you continue on your current medications as directed. Please refer to the Current Medication list given to you today.   Labwork: WILL REQUEST LABS FROM YOUR PRIMARY CARE PROVIDER  Testing/Procedures: NONE  Follow-Up: Your physician wants you to follow-up in: [place] DR. BRANCH.  You will receive a reminder letter in the mail two months in advance. If you don't receive a letter, please call our office to schedule the follow-up appointment.   Any Other Special Instructions Will Be Listed Below (If Applicable).     If you need a refill on your cardiac medications before your next appointment, please call your pharmacy.

## 2015-03-02 ENCOUNTER — Other Ambulatory Visit: Payer: Self-pay | Admitting: Cardiology

## 2015-05-10 ENCOUNTER — Other Ambulatory Visit: Payer: Self-pay | Admitting: Emergency Medicine

## 2015-06-08 ENCOUNTER — Encounter: Payer: Self-pay | Admitting: Emergency Medicine

## 2015-06-08 ENCOUNTER — Ambulatory Visit (INDEPENDENT_AMBULATORY_CARE_PROVIDER_SITE_OTHER): Payer: BLUE CROSS/BLUE SHIELD | Admitting: Emergency Medicine

## 2015-06-08 VITALS — BP 126/82 | HR 60 | Ht 68.0 in | Wt 299.0 lb

## 2015-06-08 DIAGNOSIS — G4733 Obstructive sleep apnea (adult) (pediatric): Secondary | ICD-10-CM | POA: Diagnosis not present

## 2015-06-08 DIAGNOSIS — J449 Chronic obstructive pulmonary disease, unspecified: Secondary | ICD-10-CM | POA: Diagnosis not present

## 2015-06-08 NOTE — Assessment & Plan Note (Signed)
She is using albuterol fairly frequently but she also intends to continue to lose weight. She is hopeful that in doing so she will not need to go back on maintenance bronchodilators. We will hold off on Spiriva for now, continue the albuterol, assess her frequency of usage at our next visit.

## 2015-06-08 NOTE — Assessment & Plan Note (Signed)
Continue current CPAP settings.  

## 2015-06-08 NOTE — Patient Instructions (Signed)
Please continue your proventil 2 puffs as needed.  Depending on your breathing we may revisit the benefits of adding back a scheduled inhaler.  Please continue your CPAP as you are using it.  Follow with Dr Lamonte Sakai in 6 months or sooner if you have any problems

## 2015-06-08 NOTE — Progress Notes (Signed)
Subjective:    Patient ID: Meghan Hoover, female    DOB: [birth year], 55 y.o.   MRN: [id number]  Shortness of Breath Associated symptoms include leg swelling and rhinorrhea. Pertinent negatives include no ear pain, fever, headaches, rash, sore throat, vomiting or wheezing.   55 yo woman, former smoker (56 pk-yrs), OSA, HTN, GERD. Also with hx critical care illness due to cor pulmonale in setting of apparent LE cellulitis with intubation, trach, LTAC admission in 11/'14. She is referred today for dyspnea, possible COPD. She underwent PFT 03/12/13, shows mild to mod mixed disease, no BD response.  She has exertional SOB, some congestion. Coughs up phlegm regularly. Good CPAP compliance.   ROV 05/27/13 -- follows up for dyspnea, OSA/OHS, COPD based on mixed disease on spiro.  She did not desaturate but only walked one lap. We did trial Spiriva last time > she feels that she has benefited. Able to do more activity > she has started spring cleaning. Wears her CPAP mask reliably.   ROV 10/04/14 -- Follow-up visit for dyspnea, obstructive sleep apnea/obesity hyperventilation syndrome, and mixed disease on spirometry which we have ascribed to COPD (80-pack-year history). Since her last visit she has experienced one exacerbation proximally one year ago. Importantly she has also undergone gastric bypass surgery in June 2016. She has lost 79 lbs!  Her breathing has improved. Less albuterol use. She is compliant with BiPAP at 10/5. She needs her fluticasone nasal spray refilled  ROV 12/09/14 -- follow-up for obstructive sleep apnea/obesity hypoventilation syndrome, COPD. She underwent a home BiPAP titration study, AutoSet titration that showed adequate control on CPAP 10. She rarely needs albuterol.   ROV 06/08/15 -- patient with a history of obesity, obstructive sleep apnea/obesity hypoventilation syndrome,former tobacco use and COPD based on mixed disease from spirometry 03/12/13. She returns today for regular  follow-up. Currently maintained on all her titration CPAP (range 5-20 cm water) based on her most recent titration study. She has lost 13 more pounds since our last visit. She remains on loratadine, Flonase, Nexium.  She stopped her Spiriva since our last visit due to expense when her insurance deductible had not been met. She seems to have tolerated this. Some increased SOB with the warm weather. She has been active, better able to exert. Uses proventil 2-3x a day.   Review of Systems  Constitutional: Negative for fever and unexpected weight change.  HENT: Positive for congestion, postnasal drip, rhinorrhea and sinus pressure. Negative for dental problem, ear pain, nosebleeds, sneezing, sore throat and trouble swallowing.   Eyes: Negative for redness and itching.  Respiratory: Positive for cough and shortness of breath. Negative for chest tightness and wheezing.   Cardiovascular: Positive for leg swelling. Negative for palpitations.  Gastrointestinal: Negative for nausea and vomiting.  Genitourinary: Negative for dysuria.  Musculoskeletal: Negative for joint swelling.  Skin: Negative for rash.  Neurological: Negative for headaches.  Hematological: Does not bruise/bleed easily.  Psychiatric/Behavioral: Negative for dysphoric mood. The patient is not nervous/anxious.        Objective:   Physical Exam.  Filed Vitals:   06/08/15 1138  BP: 126/82  Pulse: 60  Height: '5\' 8"'$  (1.727 m)  Weight: 299 lb (135.626 kg)  SpO2: 94%   Gen: Pleasant, obese woman, she has clearly lost weight, in no distress,  normal affect  ENT: No lesions,  mouth clear,  oropharynx clear, no postnasal drip  Neck: No JVD, no TMG, no carotid bruits, well healed trach scar  Lungs: No use  of accessory muscles, small volumes, clear without rales or rhonchi  Cardiovascular: RRR, heart sounds normal, no murmur or gallops, no peripheral edema  Musculoskeletal: No deformities, no cyanosis or clubbing  Neuro: alert, non  focal  Skin: Warm, no lesions or rashes      Assessment & Plan:  COPD (chronic obstructive pulmonary disease) She is using albuterol fairly frequently but she also intends to continue to lose weight. She is hopeful that in doing so she will not need to go back on maintenance bronchodilators. We will hold off on Spiriva for now, continue the albuterol, assess her frequency of usage at our next visit.  Obstructive sleep apnea Continue current CPAP settings   Baltazar Apo, MD, PhD 06/08/2015, 11:53 AM  Pulmonary and Critical Care [phone] or if no answer [phone]

## 2015-06-28 ENCOUNTER — Encounter: Payer: BLUE CROSS/BLUE SHIELD | Attending: Surgery | Admitting: Dietician

## 2015-06-28 ENCOUNTER — Encounter: Payer: Self-pay | Admitting: Dietician

## 2015-06-28 DIAGNOSIS — Z09 Encounter for follow-up examination after completed treatment for conditions other than malignant neoplasm: Secondary | ICD-10-CM | POA: Diagnosis present

## 2015-06-28 DIAGNOSIS — Z9884 Bariatric surgery status: Secondary | ICD-10-CM | POA: Insufficient documentation

## 2015-06-28 NOTE — Patient Instructions (Addendum)
Goals: Follow Phase 3B: High Protein + Non-Starchy Vegetables Eat 3-6 small meals/snacks, every 3-5 hrs Increase lean protein foods to meet 64g goal Increase fluid intake to 64oz + Avoid drinking 15 minutes before, during and 30 minutes after eating Aim for >30 min of physical activity daily Continue to keep tempting foods out of the house Eat protein first, then vegetables, and then carbs/fruit (have a taste) Look into talking to a therapist  Surgery date:  06/27/14 Surgery type: RYGB Start weight at [facility]: 395 lbs on 03/22/14, 406.5 on 5/16 Weight today: 291.0 lbs  Weight change: 10.5 lbs Total weight loss: 115.5 lbs Weight loss goal: 220 lbs

## 2015-06-28 NOTE — Progress Notes (Signed)
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 Returns with a 10.5 lbs weight loss. Has been doing a lot of emotional eating- mostly with chocolate. Feels like she had been snacking too much and not having enough water. Has gone back to protein shakes and protein at lunch. Has gotten a lot problem foods out of her house. Can eat about 3 oz at a time and can eat 3 oz an hour later.   Eating yogurt at 2 AM 3x week. Thinks she is hungry but not sure.   Got knees injected back in March. Active most days but activity is stretched out through the day.   Surgery date:  06/27/14 Surgery type: RYGB Start weight at [facility]: 395 lbs on 03/22/14, 406.5 on 5/16 Weight today: 291.0 lbs  Weight change: 10.5 lbs Total weight loss: 115.5 lbs Weight loss goal: 220 lbs  TANITA  BODY COMP RESULTS  06/06/14 07/12/14 08/23/14 09/29/14 12/28/14 06/28/15   BMI (kg/m^2) 61.8 57.2 53.4 50.7 45.8 44.2   Fat Mass (lbs) 214.5 209.0 179.0 164.0 138 143.8   Fat Free Mass (lbs) 192 167.0 172.0 169.5 163.0 147.2   Total Body Water (lbs) 140.5 122.0 126.0 124.0 119.5 108.8     Preferred Learning Style:   No preference indicated   Learning Readiness:   Ready  24-hr recall: B (AM): Premier Protein shake (30 g) Snk (AM): none or boiled egg or Dannon Light and Fit L (PM): 3.0 oz cheese stick with chicken with salad (21 g) Snk (PM): none D (PM): 3 oz fish/steak/chicken with vegetable and a bite of sweet potato (21 g) Snk (PM): semi sweet chocolate chips with sweet and low and butter or olive oil with fruit and sugar free pudding OR 8 crackers with ranch dressing (none for last 2 nights) 2 AM: yogurt 2 x week   Fluid intake: protein shake, 2-3 bottles of water, unsweet tea with stevia or sweet and low (at least 64 oz), 24-36 oz diet pepsi every day Estimated total protein  intake: around 72 g  Medications: see list  Supplementation: taking  Using straws: No, except at restaurant Drinking while eating: might take sips if having something spicy Hair loss: No Carbonated beverages: Diet Pepsi  N/V/D/C: nausea and vomiting if she overeats Dumping syndrome: None  Recent physical activity:  Walking total of 60 minutes each day, 3 x week recumbent bike 25-30 minutes, exercises during commercials   Progress Towards Goal(s):  In progress.  Handouts given during visit include:  Therapists in the area   Nutritional Diagnosis:  [place]-3.3 Overweight/obesity related to past poor dietary habits and physical inactivity as evidenced by patient w/ recent RYGB surgery following dietary guidelines for continued weight loss.    Intervention:  Nutrition education/diet reinforcement Goals: Follow Phase 3B: High Protein + Non-Starchy Vegetables Eat 3-6 small meals/snacks, every 3-5 hrs Increase lean protein foods to meet 64g goal Increase fluid intake to 64oz + Avoid drinking 15 minutes before, during and 30 minutes after eating Aim for >30 min of physical activity daily Continue to keep tempting foods out of the house Eat protein first, then vegetables, and then carbs/fruit (have a taste) Look into talking to a therapist   Teaching Method Utilized:  Visual Auditory Hands on  Barriers to learning/adherence to lifestyle change: none  Demonstrated degree of understanding via:  Teach Back   Monitoring/Evaluation:  Dietary intake, exercise, and body weight. Follow up in 2 months for 14 month post-op visit.

## 2015-08-09 ENCOUNTER — Other Ambulatory Visit (HOSPITAL_COMMUNITY): Payer: Self-pay | Admitting: Family Medicine

## 2015-08-09 DIAGNOSIS — Z1231 Encounter for screening mammogram for malignant neoplasm of breast: Secondary | ICD-10-CM

## 2015-08-17 ENCOUNTER — Encounter: Payer: BLUE CROSS/BLUE SHIELD | Attending: Surgery | Admitting: Dietician

## 2015-08-17 ENCOUNTER — Encounter: Payer: Self-pay | Admitting: Dietician

## 2015-08-17 DIAGNOSIS — Z9884 Bariatric surgery status: Secondary | ICD-10-CM | POA: Diagnosis not present

## 2015-08-17 DIAGNOSIS — Z09 Encounter for follow-up examination after completed treatment for conditions other than malignant neoplasm: Secondary | ICD-10-CM | POA: Diagnosis present

## 2015-08-17 NOTE — Progress Notes (Signed)
Follow-up visit: 1 Year+ Post-Operative RYGB Surgery  Medical Nutrition Therapy:  Appt start time: 1030 end time:  1100  Primary concerns today: Post-operative Bariatric Surgery Nutrition Management. Returns with a 7 lbs weight gain in the past 6 weeks. Feels like she is overeating on the "diet food" and has removed the "junk" from her house. Eating Premier Protein bars, SF pudding, and SF popsicles. Feels like she is eating too much after dinner. Cutting back on diet pepsi. Not getting up to eat at night any more.   Doesn't ever feel hungry but does emotional eating at night. Has not contacted the therapist yet. Tried moving dinner time later and started reading after dinner.   Surgery date:  06/27/14 Surgery type: RYGB Start weight at [facility]: 395 lbs on 03/22/14, 406.5 on 5/16 Weight today: 298.2 lbs  Weight change: 7 lbs Total weight loss: 108.3 lbs Weight loss goal: 220 lbs  TANITA  BODY COMP RESULTS  06/06/14 07/12/14 08/23/14 09/29/14 12/28/14 06/28/15 08/17/15   BMI (kg/m^2) 61.8 57.2 53.4 50.7 45.8 44.2 45.3   Fat Mass (lbs) 214.5 209.0 179.0 164.0 138 143.8 145.6   Fat Free Mass (lbs) 192 167.0 172.0 169.5 163.0 147.2 152.6   Total Body Water (lbs) 140.5 122.0 126.0 124.0 119.5 108.8 112.8     Preferred Learning Style:   No preference indicated   Learning Readiness:   Ready  24-hr recall: B (AM): Premier Protein shake  Or yogurt (12-30 g) Snk (AM): boiled egg Premier Protein shake  Or yogurt (6-30 g) L (PM): 3.0 oz cheese stick with chicken with salad (21 g) Snk (PM): none or fruit (5 cherries or apple) or 10 protein chips D (PM): 3 oz fish/steak/chicken with vegetable and a bite of sweet potato (21 g) Snk (PM): Premier Protein bars, SF pudding, yogurt, apple with pb2 or SF popsicles   Fluid intake: protein shake, 2-3 bottles of water, unsweet tea with stevia or sweet and low (at least 64 oz), 1 diet pepsi every other day Estimated total protein intake: around 72  g  Medications: see list  Supplementation: taking  Using straws: No, except at restaurant Drinking while eating: might take sips if having something spicy Hair loss: No Carbonated beverages: Diet Pepsi  N/V/D/C: nausea and vomiting if she overeats Dumping syndrome: None  Recent physical activity:  Water exercise 7 x week for 1 hour   Progress Towards Goal(s):  In progress.  Handouts given during visit include:  Therapists in the area   Nutritional Diagnosis:  [place]-3.3 Overweight/obesity related to past poor dietary habits and physical inactivity as evidenced by patient w/ recent RYGB surgery following dietary guidelines for continued weight loss.    Intervention:  Nutrition education/diet reinforcement Goals: Follow Phase 3B: High Protein + Non-Starchy Vegetables Eat 3-6 small meals/snacks, every 3-5 hrs Increase lean protein foods to meet 64g goal Increase fluid intake to 64oz + Avoid drinking 15 minutes before, during and 30 minutes after eating Aim for >30 min of physical activity daily Eat protein first, then vegetables, and then carbs/fruit (have a taste) Try making ranch with non fat Greek yogurt Try Ortencia Kick or Skinny Girl Honey Mustard Look into talking to a therapist Consider cutting out daytime snacks if you are not hungry and meeting protein goal.  Plan to have a snack a night at the table with no electronics   Teaching Method Utilized:  Visual Auditory Hands on  Barriers to learning/adherence to lifestyle change: none  Demonstrated degree of understanding  via:  Teach Back   Monitoring/Evaluation:  Dietary intake, exercise, and body weight. Follow up in 6 weeks for 15 month post-op visit.

## 2015-08-17 NOTE — Patient Instructions (Addendum)
Goals: Follow Phase 3B: High Protein + Non-Starchy Vegetables Eat 3-6 small meals/snacks, every 3-5 hrs Increase lean protein foods to meet 64g goal Increase fluid intake to 64oz + Avoid drinking 15 minutes before, during and 30 minutes after eating Aim for >30 min of physical activity daily Eat protein first, then vegetables, and then carbs/fruit (have a taste) Try making ranch with non fat Greek yogurt Try Ortencia Kick or Skinny Girl Honey Mustard Look into talking to a therapist Consider cutting out daytime snacks if you are not hungry and meeting protein goal.  Plan to have a snack a night at the table with no electronics   Surgery date:  06/27/14 Surgery type: RYGB Start weight at [facility]: 395 lbs on 03/22/14, 406.5 on 5/16 Weight today: 298.2 lbs  Weight change: 7 lbs Total weight loss: 108.3 lbs Weight loss goal: 220 lbs  TANITA  BODY COMP RESULTS  06/06/14 07/12/14 08/23/14 09/29/14 12/28/14 06/28/15 08/17/15   BMI (kg/m^2) 61.8 57.2 53.4 50.7 45.8 44.2 45.3   Fat Mass (lbs) 214.5 209.0 179.0 164.0 138 143.8 145.6   Fat Free Mass (lbs) 192 167.0 172.0 169.5 163.0 147.2 152.6   Total Body Water (lbs) 140.5 122.0 126.0 124.0 119.5 108.8 112.8

## 2015-08-21 ENCOUNTER — Other Ambulatory Visit: Payer: Self-pay | Admitting: Emergency Medicine

## 2015-09-11 ENCOUNTER — Ambulatory Visit (HOSPITAL_COMMUNITY)
Admission: RE | Admit: 2015-09-11 | Discharge: 2015-09-11 | Disposition: A | Discharge status: Home / Self Care | Payer: BLUE CROSS/BLUE SHIELD | Source: Ambulatory Visit | Attending: Family Medicine | Admitting: Family Medicine

## 2015-09-11 DIAGNOSIS — Z1231 Encounter for screening mammogram for malignant neoplasm of breast: Secondary | ICD-10-CM | POA: Diagnosis present

## 2015-09-28 ENCOUNTER — Encounter: Payer: BLUE CROSS/BLUE SHIELD | Attending: Surgery | Admitting: Dietician

## 2015-09-28 DIAGNOSIS — Z6841 Body Mass Index (BMI) 40.0 and over, adult: Secondary | ICD-10-CM

## 2015-09-28 DIAGNOSIS — Z9884 Bariatric surgery status: Secondary | ICD-10-CM | POA: Insufficient documentation

## 2015-09-28 DIAGNOSIS — Z09 Encounter for follow-up examination after completed treatment for conditions other than malignant neoplasm: Secondary | ICD-10-CM | POA: Insufficient documentation

## 2015-09-28 NOTE — Patient Instructions (Addendum)
Goals: Follow Phase 3B: High Protein + Non-Starchy Vegetables Eat 3-6 small meals/snacks, every 3-5 hrs Increase lean protein foods to meet 64g goal Increase fluid intake to 64oz + Avoid drinking 15 minutes before, during and 30 minutes after eating Aim for >30 min of physical activity daily Eat protein first, then vegetables, and then carbs/fruit (have a taste) Try Ortencia Kick or Skinny Girl Honey Mustard Look into talking to a therapist Plan to have a snack a night at the table with no electronics   Surgery date:  06/27/14 Surgery type: RYGB Start weight at [facility]: 395 lbs on 03/22/14, 406.5 on 5/16 Weight today: 304.8 lbs  Weight change: 6.6 lbs weight gain Total weight loss: 101.7 lbs Weight loss goal: 220 lbs  TANITA  BODY COMP RESULTS  06/06/14 07/12/14 08/23/14 09/29/14 12/28/14 06/28/15 08/17/15 09/28/15   BMI (kg/m^2) 61.8 57.2 53.4 50.7 45.8 44.2 45.3 46.3   Fat Mass (lbs) 214.5 209.0 179.0 164.0 138 143.8 145.6 146.0   Fat Free Mass (lbs) 192 167.0 172.0 169.5 163.0 147.2 152.6 158.8   Total Body Water (lbs) 140.5 122.0 126.0 124.0 119.5 108.8 112.8 117.4

## 2015-09-28 NOTE — Progress Notes (Signed)
  Follow-up visit: 1 Year+ Post-Operative RYGB Surgery  Medical Nutrition Therapy:  Appt start time: 1020 end time:  1050  Primary concerns today: Post-operative Bariatric Surgery Nutrition Management. Returns with a 6.6 lbs weight gain. No longer snacking during the day but eating a lot at night between 8-11.   Vomits about 2 x week when she overeats at night.   Doesn't ever feel hungry but does emotional eating at night. Has not contacted the therapist yet. Tried moving dinner time later and started reading after dinner.   Surgery date:  06/27/14 Surgery type: RYGB Start weight at [facility]: 395 lbs on 03/22/14, 406.5 on 5/16 Weight today: 304.8 lbs  Weight change: 6.6 lbs weight gain Total weight loss: 101.7 lbs Weight loss goal: 220 lbs  TANITA  BODY COMP RESULTS  06/06/14 07/12/14 08/23/14 09/29/14 12/28/14 06/28/15 08/17/15 09/28/15   BMI (kg/m^2) 61.8 57.2 53.4 50.7 45.8 44.2 45.3    Fat Mass (lbs) 214.5 209.0 179.0 164.0 138 143.8 145.6    Fat Free Mass (lbs) 192 167.0 172.0 169.5 163.0 147.2 152.6    Total Body Water (lbs) 140.5 122.0 126.0 124.0 119.5 108.8 112.8      Preferred Learning Style:   No preference indicated   Learning Readiness:   Ready  24-hr recall: B (AM): Premier Protein shake (30 g) Snk (AM): none L (PM): 2-3 oz ham and cheese stick with salad (14-21 g) Snk (PM): none D (PM): 2-3 oz fish/steak/chicken/pork chop with vegetable and a bite of sweet potato (rare) (14-21 g) Snk (PM): nuts, cheese, premier protein bars,Halo ice cream, carrots   Fluid intake: protein shake, 2-5 bottles of water, unsweet tea with stevia or sweet and low (at least 64 oz), 1-2 diet pepsi every day Estimated total protein intake: around 60-70 g  Medications: see list  Supplementation: taking  Using straws: No  Drinking while eating: might take sips if having something spicy Hair loss: No, it's getting thicker Carbonated beverages: Diet Pepsi  N/V/D/C: nausea and vomiting if she  overeats (about 2 x week) Dumping syndrome: None  Recent physical activity:  Walking 6 days per week for at least an hour and 20 minutes and 1 x week another exericse  Progress Towards Goal(s):  In progress.  Handouts given during visit include:  Therapists in the area   Nutritional Diagnosis:  -3.3 Overweight/obesity related to past poor dietary habits and physical inactivity as evidenced by patient w/ recent RYGB surgery following dietary guidelines for continued weight loss.    Intervention:  Nutrition education/diet reinforcement Goals: Follow Phase 3B: High Protein + Non-Starchy Vegetables Eat 3-6 small meals/snacks, every 3-5 hrs Increase lean protein foods to meet 64g goal Increase fluid intake to 64oz + Avoid drinking 15 minutes before, during and 30 minutes after eating Aim for >30 min of physical activity daily Eat protein first, then vegetables, and then carbs/fruit (have a taste) Try Ortencia Kick or Skinny Girl Honey Mustard Look into talking to a therapist Plan to have a snack a night at the table with no electronics   Teaching Method Utilized:  Visual Auditory Hands on  Barriers to learning/adherence to lifestyle change: none  Demonstrated degree of understanding via:  Teach Back   Monitoring/Evaluation:  Dietary intake, exercise, and body weight. Follow up in 6 weeks

## 2015-10-11 ENCOUNTER — Telehealth: Payer: Self-pay | Admitting: Cardiology

## 2015-10-11 NOTE — Telephone Encounter (Signed)
Pt has been having some left arm pain w/ exercise

## 2015-10-11 NOTE — Telephone Encounter (Signed)
Will forward to Dr. Branch. 

## 2015-10-12 NOTE — Telephone Encounter (Signed)
Spoke to patient, she is willing to come to the [place] office on Wednesday 9/27 @ 3:40 to see Dr. Harl Bowie.

## 2015-10-12 NOTE — Telephone Encounter (Signed)
If she is willing to come to [facility] I can see her 340pm on either Wed or Thurs. If not can we get her a PA visit in [place] next week   J Rekia Kujala MD

## 2015-10-17 ENCOUNTER — Encounter: Payer: Self-pay | Admitting: *Deleted

## 2015-10-18 ENCOUNTER — Ambulatory Visit (INDEPENDENT_AMBULATORY_CARE_PROVIDER_SITE_OTHER): Payer: BLUE CROSS/BLUE SHIELD | Admitting: Cardiology

## 2015-10-18 ENCOUNTER — Encounter: Payer: Self-pay | Admitting: Cardiology

## 2015-10-18 ENCOUNTER — Encounter: Payer: Self-pay | Admitting: *Deleted

## 2015-10-18 ENCOUNTER — Telehealth: Payer: Self-pay | Admitting: Cardiology

## 2015-10-18 ENCOUNTER — Ambulatory Visit: Payer: BLUE CROSS/BLUE SHIELD | Admitting: Cardiology

## 2015-10-18 VITALS — BP 102/62 | HR 68 | Ht 68.0 in | Wt 310.0 lb

## 2015-10-18 DIAGNOSIS — R0789 Other chest pain: Secondary | ICD-10-CM

## 2015-10-18 DIAGNOSIS — I5032 Chronic diastolic (congestive) heart failure: Secondary | ICD-10-CM

## 2015-10-18 DIAGNOSIS — I10 Essential (primary) hypertension: Secondary | ICD-10-CM

## 2015-10-18 NOTE — Telephone Encounter (Signed)
L and R [facility] 10/24/15 @ 7:30am w/[place]  Checking percert

## 2015-10-18 NOTE — Progress Notes (Signed)
Clinical Summary [name] is a 55 y.o.female seen today for follow up of the following medical problems.   1. Chronic diastolic heart failure - denies any SOB or DOE. No significant LE edema - since her bariatric surgery and significant weight loss her fluid status and breathing symptoms have improved  - recent weight gain, increased SOB/DOE.   2. OSA  - she is compliant with CPAP machine.   3. HTN - compliant with meds - checks at home daily, typically 110s/60s  4. Obesity - s/p gastric bypass 06/2014. Continues to lose weight since surgery, down another 50 lbs since our last visit. .   5. COPD - followed by pulmonary  6. Chest pain - occurs with walking. Pain starts in chest and radiates down left arm.  - dull pain, heaviness. 6-7/10. Never occurs at rest  - started a few months ago, when she started increasing her activity/exercise levels - lasts just a few seconds. No other associated symptoms. Resolves with rest.  - increase in frequency since onset.  - recent weight gain. Recently she increased to torsemide 20mg  bid with mild improvement.     CAD risk factprs: former tobacco, HTN, Father MI 68 Past Medical History:  Diagnosis Date  . Anxiety   . Bilateral lower extremity edema    hx of 2014   . Bronchitis    hx of   . CHF (congestive heart failure) ([place])   . COPD (chronic obstructive pulmonary disease) ([place])   . Coronary artery disease   . Depression   . Dysphagia   . GERD (gastroesophageal reflux disease)   . Heart failure ([place])   . History of frequent urinary tract infections    last one 10 years ago   . Hypertension   . Hypothyroid   . Numbness    right hand since 2014  . Pneumonia    2014  . Shortness of breath dyspnea    with exertion   . Sleep apnea    uses CPAP  . Urinary incontinence      Allergies  Allergen Reactions  . Augmentin [Amoxicillin-Pot Clavulanate] Other (See Comments)    thrush     Current Outpatient  Prescriptions  Medication Sig Dispense Refill  . Biotin (BIOTIN MAXIMUM STRENGTH) 10 MG TABS Take by mouth.    . Calcium Carb-Cholecalciferol (CALCIUM 600 + D PO) Take 1 tablet by mouth 3 (three) times daily.    . Cholecalciferol (VITAMIN D3) 2000 UNITS TABS Take 1 tablet by mouth every morning.     . colchicine 0.6 MG tablet Take 1 tablet by mouth daily as needed.    . Cyanocobalamin (VITAMIN B-12 CR PO) Take 1 tablet by mouth every morning.     . diltiazem (DILACOR XR) 180 MG 24 hr capsule Take 1 capsule (180 mg total) by mouth daily. (Patient taking differently: Take 180 mg by mouth at bedtime. ) 90 capsule 3  . esomeprazole (NEXIUM) 40 MG capsule Take 40 mg by mouth every morning.     . fluticasone (FLONASE) 50 MCG/ACT nasal spray Place 2 sprays into both nostrils 2 (two) times daily. 48 g 3  . levothyroxine (SYNTHROID, LEVOTHROID) 75 MCG tablet Take 75 mcg by mouth daily before breakfast.    . lidocaine (LIDODERM) 5 % Place 1-2 patches onto the skin daily.     Marland Kitchen loratadine (CLARITIN) 10 MG tablet Take 10 mg by mouth every morning. Alternates with Zyrtec.    . Multiple Vitamins-Minerals (MULTIVITAMIN  WITH MINERALS) tablet Take 1 tablet by mouth every morning.     Marland Kitchen PROAIR HFA 108 (90 Base) MCG/ACT inhaler INHALE 2 PUFFS INTO THE LUNGS EVERY 6 (SIX) HOURS AS NEEDED FOR WHEEZING OR SHORTNESS OF BREATH. 8.5 Inhaler 3  . torsemide (DEMADEX) 20 MG tablet TAKE 1 TABLET BY MOUTH TWICE A DAY 180 tablet 3   No current facility-administered medications for this visit.      Past Surgical History:  Procedure Laterality Date  . ACHILLES TENDON SURGERY    . BACK SURGERY     x4  . COLONOSCOPY N/A 05/24/2013   Procedure: COLONOSCOPY;  Surgeon: Daneil Dolin, MD;  Location: [facility] SUITE;  Service: Endoscopy;  Laterality: N/A;  3:00 PM  . GASTROSTOMY W/ FEEDING TUBE     hx of 2014   . LAPAROSCOPIC ROUX-EN-Y GASTRIC BYPASS WITH UPPER ENDOSCOPY AND REMOVAL OF LAP BAND N/A 06/27/2014   Procedure:  LAPAROSCOPIC ROUX-EN-Y GASTRIC BYPASS   WITH UPPER ENDOSCOPY;  Surgeon: Johnathan Hausen, MD;  Location: [facility] ORS;  Service: General;  Laterality: N/A;  . swallowing evaluation      hx of   . TONSILECTOMY, ADENOIDECTOMY, BILATERAL MYRINGOTOMY AND TUBES    . TONSILLECTOMY    . TRACHEOSTOMY     11/2012  . TRACHEOSTOMY CLOSURE     11/2012  . UPPER GI ENDOSCOPY  06/27/2014   Procedure: UPPER GI ENDOSCOPY;  Surgeon: Johnathan Hausen, MD;  Location: [facility] ORS;  Service: General;;     Allergies  Allergen Reactions  . Augmentin [Amoxicillin-Pot Clavulanate] Other (See Comments)    thrush      Family History  Problem Relation Age of Onset  . Heart disease Mother   . Emphysema Father   . Heart disease Father   . Cancer Father     lung  . Diabetes Sister   . Asthma Sister   . Colon cancer Neg Hx      Social History [name] reports that she quit smoking about 2 years ago. Her smoking use included Cigarettes. She started smoking about 44 years ago. She has a 120.00 pack-year smoking history. She has never used smokeless tobacco. [name] reports that she does not drink alcohol.   Review of Systems CONSTITUTIONAL: No weight loss, fever, chills, weakness or fatigue.  HEENT: Eyes: No visual loss, blurred vision, double vision or yellow sclerae.No hearing loss, sneezing, congestion, runny nose or sore throat.  SKIN: No rash or itching.  CARDIOVASCULAR: per HPI RESPIRATORY:per HPI GASTROINTESTINAL: No anorexia, nausea, vomiting or diarrhea. No abdominal pain or blood.  GENITOURINARY: No burning on urination, no polyuria NEUROLOGICAL: No headache, dizziness, syncope, paralysis, ataxia, numbness or tingling in the extremities. No change in bowel or bladder control.  MUSCULOSKELETAL: No muscle, back pain, joint pain or stiffness.  LYMPHATICS: No enlarged nodes. No history of splenectomy.  PSYCHIATRIC: No history of depression or anxiety.  ENDOCRINOLOGIC: No reports of sweating, cold or heat  intolerance. No polyuria or polydipsia.  Marland Kitchen   Physical Examination Vitals:   10/18/15 1447  BP: 102/62  Pulse: 68   Vitals:   10/18/15 1447  Weight: (!) 310 lb (140.6 kg)  Height: 5\' 8"  (1.727 m)    Gen: resting comfortably, no acute distress HEENT: no scleral icterus, pupils equal round and reactive, no palptable cervical adenopathy,  CV: RRR, no m/r/g, no jvd Resp: Clear to auscultation bilaterally GI: abdomen is soft, non-tender, non-distended, normal bowel sounds, no hepatosplenomegaly MSK: extremities are warm, no edema.  Skin: warm, no rash Neuro:  no focal deficits Psych: appropriate affect   Diagnostic Studies 06/2013 Exercise MPI Baseline tracing shows sinus rhythm at 91 beats per min with poor weight progression. Patient was exercised by Bruce protocol for 2 min and 46 seconds achieving a maximum work load of 4.6 METS. Heart rate increased from 94 beats per min up to 146 beats per min which was 87% of the maximum age predicted heart rate. Blood pressure increased from 154/76 up to 200/72. No chest pain rule was reported, however exercise was limited by shortness of breath and fatigue. There were no clearly diagnostic ST segment abnormalities, and no arrhythmias were noted.  Analysis of the overall perfusion data finds somewhat heterogeneous radiotracer uptake with evidence of breast attenuation.  Tomographic views were obtained using the short axis, vertical long axis, and horizontal long axis planes. There is a small, mild intensity basal anterior defect that is fixed and consistent with soft tissue attenuation. There is also a small, moderate intensity inferior wall defect that is generally more prominent at rest than stress, overall fixed with partial reversibility in the mid inferoseptal and inferolateral segment. Suspect that this is related to variable diaphragmatic attenuation, however cannot completely exclude a mild degree of ischemia.  Gated  imaging reveals an EDV of 109, ESV of 20, TID ratio 0.69, and LVEF of 75% with normal wall motion.  IMPRESSION: Low risk exercise Cardiolite as outlined. Patient had limited exercise tolerance due to shortness of breath and fatigue, achieved a maximum work load of 4.6 METS. There were no diagnostic ST segment abnormalities or arrhythmias. Perfusion imaging is most consistent with breast tissue attenuation as well as variable diaphragmatic attenuation, cannot completely exclude a mild degree of inferior ischemia as outlined. LVEF is vigorous at 75% with normal volumes and wall motion.  12/04/2012 Echo  Technically difficult study, LVEF 0000000, grade I diastolic dysfunction, mild AS, cannot estimate PASP, normal IVC, normal RV   11/24/12 Echo  LVEF 55-60%, moderate RV dilatation with normal function   11/20/12 Echo  LVEF 65-70%, grade II diastolic dysfunction, flattened RV septum in diastole and systole, severe RV dilatatoin, PASP 94mmHg   02/2013 PFTs Obstruction and diffusion defect suggesting emphysema, however absence of hyperinflaction. Overall minimal obstructive disease.      Assessment and Plan  1. Chronic Diastolic heart failure  - recent weight gain, increased SOB/DOE. Exam limited due to body habiuts regarding fluid status. - continue increased torsemide. With upcoming LHC will ask for RHC as well.  - EKG in clinic NSR - start ASA 81mg  daily   2. OSA - continue CPAP  3. HTN - at goal, we will continue current meds  4. Chest pain - exertional chest pain with associated SOB and DOE. REsolves with rest. Increasing in frequency since onset. Casimer Lanius 2015 probable artifact but could not rule out ischemia - given her progressing symptoms, will plan for definitive LHC to evaluate. Given her weight gain and increased SOB/DOE, and difficulty assessing volume status by exam will ask for RHC as well   I have reviewed the risks, indications, and  alternatives to cardiac catheterization, possible angioplasty, and stenting with the patient. Risks include but are not limited to bleeding, infection, vascular injury, stroke, myocardial infection, arrhythmia, kidney injury, radiation-related injury in the case of prolonged fluoroscopy use, emergency cardiac surgery, and death. The patient understands the risks of serious complication is 1-2 in [id number] with diagnostic cardiac cath and 1-2% or less with angioplasty/stenting.  F/u 4 weeks.   Arnoldo Lenis, M.D.,

## 2015-10-18 NOTE — Progress Notes (Deleted)
Clinical Summary Meghan Hoover is a 55 y.o.female seen today for follow up of the following medical problems.    1. Chronic diastolic heart failure - denies any SOB or DOE. No significant LE edema - since her bariatric surgery and significant weight loss her fluid status and breathing symptoms have significaintly improved.   - no LE edema. No significant SOB or DOE. Taking torsemide once daily.   2. OSA  - compliant with CPAP machine.   3. HTN - compliant with meds - checks at home daily, typically 110s/60s  4. Obesity - s/p gastric bypass 06/2014. Continues to lose weight since surgery, down another 50 lbs since our last visit. .   5. COPD - followed by pulmonary  6. Left arm pain -  Past Medical History:  Diagnosis Date  . Anxiety   . Bilateral lower extremity edema    hx of 2014   . Bronchitis    hx of   . CHF (congestive heart failure) ([place])   . COPD (chronic obstructive pulmonary disease) ([place])   . Coronary artery disease   . Depression   . Dysphagia   . GERD (gastroesophageal reflux disease)   . Heart failure ([place])   . History of frequent urinary tract infections    last one 10 years ago   . Hypertension   . Hypothyroid   . Numbness    right hand since 2014  . Pneumonia    2014  . Shortness of breath dyspnea    with exertion   . Sleep apnea    uses CPAP  . Urinary incontinence      Allergies  Allergen Reactions  . Augmentin [Amoxicillin-Pot Clavulanate] Other (See Comments)    thrush     Current Outpatient Prescriptions  Medication Sig Dispense Refill  . Biotin (BIOTIN MAXIMUM STRENGTH) 10 MG TABS Take by mouth.    . Calcium Carb-Cholecalciferol (CALCIUM 600 + D PO) Take 1 tablet by mouth 3 (three) times daily.    . Cholecalciferol (VITAMIN D3) 2000 UNITS TABS Take 1 tablet by mouth every morning.     . colchicine 0.6 MG tablet Take 1 tablet by mouth daily as needed.    . Cyanocobalamin (VITAMIN B-12 CR PO) Take 1 tablet by mouth  every morning.     . diltiazem (DILACOR XR) 180 MG 24 hr capsule Take 1 capsule (180 mg total) by mouth daily. (Patient taking differently: Take 180 mg by mouth at bedtime. ) 90 capsule 3  . esomeprazole (NEXIUM) 40 MG capsule Take 40 mg by mouth every morning.     . fluticasone (FLONASE) 50 MCG/ACT nasal spray Place 2 sprays into both nostrils 2 (two) times daily. 48 g 3  . levothyroxine (SYNTHROID, LEVOTHROID) 75 MCG tablet Take 75 mcg by mouth daily before breakfast.    . lidocaine (LIDODERM) 5 % Place 1-2 patches onto the skin daily.     Marland Kitchen loratadine (CLARITIN) 10 MG tablet Take 10 mg by mouth every morning. Alternates with Zyrtec.    . Multiple Vitamins-Minerals (MULTIVITAMIN WITH MINERALS) tablet Take 1 tablet by mouth every morning.     Marland Kitchen PROAIR HFA 108 (90 Base) MCG/ACT inhaler INHALE 2 PUFFS INTO THE LUNGS EVERY 6 (SIX) HOURS AS NEEDED FOR WHEEZING OR SHORTNESS OF BREATH. 8.5 Inhaler 3  . torsemide (DEMADEX) 20 MG tablet TAKE 1 TABLET BY MOUTH TWICE A DAY 180 tablet 3   No current facility-administered medications for this visit.  Past Surgical History:  Procedure Laterality Date  . ACHILLES TENDON SURGERY    . BACK SURGERY     x4  . COLONOSCOPY N/A 05/24/2013   Procedure: COLONOSCOPY;  Surgeon: Daneil Dolin, MD;  Location: [facility];  Service: Endoscopy;  Laterality: N/A;  3:00 PM  . GASTROSTOMY W/ FEEDING TUBE     hx of 2014   . LAPAROSCOPIC ROUX-EN-Y GASTRIC BYPASS WITH UPPER ENDOSCOPY AND REMOVAL OF LAP BAND N/A 06/27/2014   Procedure: LAPAROSCOPIC ROUX-EN-Y GASTRIC BYPASS   WITH UPPER ENDOSCOPY;  Surgeon: Johnathan Hausen, MD;  Location: [facility];  Service: General;  Laterality: N/A;  . swallowing evaluation      hx of   . TONSILECTOMY, ADENOIDECTOMY, BILATERAL MYRINGOTOMY AND TUBES    . TONSILLECTOMY    . TRACHEOSTOMY     11/2012  . TRACHEOSTOMY CLOSURE     11/2012  . UPPER GI ENDOSCOPY  06/27/2014   Procedure: UPPER GI ENDOSCOPY;  Surgeon: Johnathan Hausen, MD;   Location: [facility];  Service: General;;     Allergies  Allergen Reactions  . Augmentin [Amoxicillin-Pot Clavulanate] Other (See Comments)    thrush      Family History  Problem Relation Age of Onset  . Heart disease Mother   . Emphysema Father   . Heart disease Father   . Cancer Father     lung  . Diabetes Sister   . Asthma Sister   . Colon cancer Neg Hx      Social History Meghan Hoover reports that she quit smoking about 2 years ago. Her smoking use included Cigarettes. She started smoking about 44 years ago. She has a 120.00 pack-year smoking history. She has never used smokeless tobacco. Meghan Hoover reports that she does not drink alcohol.   Review of Systems CONSTITUTIONAL: No weight loss, fever, chills, weakness or fatigue.  HEENT: Eyes: No visual loss, blurred vision, double vision or yellow sclerae.No hearing loss, sneezing, congestion, runny nose or sore throat.  SKIN: No rash or itching.  CARDIOVASCULAR:  RESPIRATORY: No shortness of breath, cough or sputum.  GASTROINTESTINAL: No anorexia, nausea, vomiting or diarrhea. No abdominal pain or blood.  GENITOURINARY: No burning on urination, no polyuria NEUROLOGICAL: No headache, dizziness, syncope, paralysis, ataxia, numbness or tingling in the extremities. No change in bowel or bladder control.  MUSCULOSKELETAL: No muscle, back pain, joint pain or stiffness.  LYMPHATICS: No enlarged nodes. No history of splenectomy.  PSYCHIATRIC: No history of depression or anxiety.  ENDOCRINOLOGIC: No reports of sweating, cold or heat intolerance. No polyuria or polydipsia.  Marland Kitchen   Physical Examination There were no vitals filed for this visit. There were no vitals filed for this visit.  Gen: resting comfortably, no acute distress HEENT: no scleral icterus, pupils equal round and reactive, no palptable cervical adenopathy,  CV Resp: Clear to auscultation bilaterally GI: abdomen is soft, non-tender, non-distended, normal bowel  sounds, no hepatosplenomegaly MSK: extremities are warm, no edema.  Skin: warm, no rash Neuro:  no focal deficits Psych: appropriate affect   Diagnostic Studies 06/2013 Exercise MPI Baseline tracing shows sinus rhythm at 91 beats per min with poor weight progression. Patient was exercised by Bruce protocol for 2 min and 46 seconds achieving a maximum work load of 4.6 METS. Heart rate increased from 94 beats per min up to 146 beats per min which was 87% of the maximum age predicted heart rate. Blood pressure increased from 154/76 up to 200/72. No chest pain rule was reported,  however exercise was limited by shortness of breath and fatigue. There were no clearly diagnostic ST segment abnormalities, and no arrhythmias were noted.  Analysis of the overall perfusion data finds somewhat heterogeneous radiotracer uptake with evidence of breast attenuation.  Tomographic views were obtained using the short axis, vertical long axis, and horizontal long axis planes. There is a small, mild intensity basal anterior defect that is fixed and consistent with soft tissue attenuation. There is also a small, moderate intensity inferior wall defect that is generally more prominent at rest than stress, overall fixed with partial reversibility in the mid inferoseptal and inferolateral segment. Suspect that this is related to variable diaphragmatic attenuation, however cannot completely exclude a mild degree of ischemia.  Gated imaging reveals an EDV of 109, ESV of 20, TID ratio 0.69, and LVEF of 75% with normal wall motion.  IMPRESSION: Low risk exercise Cardiolite as outlined. Patient had limited exercise tolerance due to shortness of breath and fatigue, achieved a maximum work load of 4.6 METS. There were no diagnostic ST segment abnormalities or arrhythmias. Perfusion imaging is most consistent with breast tissue attenuation as well as variable diaphragmatic attenuation, cannot completely  exclude a mild degree of inferior ischemia as outlined. LVEF is vigorous at 75% with normal volumes and wall motion.  12/04/2012 Echo  Technically difficult study, LVEF 0000000, grade I diastolic dysfunction, mild AS, cannot estimate PASP, normal IVC, normal RV   11/24/12 Echo  LVEF 55-60%, moderate RV dilatation with normal function   11/20/12 Echo  LVEF 65-70%, grade II diastolic dysfunction, flattened RV septum in diastole and systole, severe RV dilatatoin, PASP 19mmHg   02/2013 PFTs Obstruction and diffusion defect suggesting emphysema, however absence of hyperinflaction. Overall minimal obstructive disease.    Assessment and Plan  1. Chronic Diastolic heart failure  - no current symptoms, continue current diuretic.   2. OSA - continue CPAP  3. HTN - at goal, we will continue current meds  4. Obesity - s/p gastric bypass with significant weight loss - continues to make great progress.   F/u 1 year. Request pcp labs.       Arnoldo Lenis, M.D., F.A.C.C.

## 2015-10-18 NOTE — Patient Instructions (Addendum)
Your physician recommends that you schedule a follow-up appointment in: [place] [facility] IN THE [place] OFFICE  Your physician has recommended you make the following change in your medication:   START ASPRIN 8 MG DAILY   Your physician has requested that you have a cardiac catheterization. Cardiac catheterization is used to diagnose and/or treat various heart conditions. Doctors may recommend this procedure for a number of different reasons. The most common reason is to evaluate chest pain. Chest pain can be a symptom of coronary artery disease (CAD), and cardiac catheterization can show whether plaque is narrowing or blocking your heart's arteries. This procedure is also used to evaluate the valves, as well as measure the blood flow and oxygen levels in different parts of your heart. For further information please visit [email or web address]. Please follow instruction sheet, as given.  Thank you for choosing [facility]!!

## 2015-10-19 ENCOUNTER — Other Ambulatory Visit: Payer: Self-pay | Admitting: Cardiology

## 2015-10-19 LAB — CBC
HCT: 40.2 % (ref 35.0–45.0)
Hemoglobin: 13.7 g/dL (ref 11.7–15.5)
MCH: 30.4 pg (ref 27.0–33.0)
MCHC: 34.1 g/dL (ref 32.0–36.0)
MCV: 89.3 fL (ref 80.0–100.0)
MPV: 10.1 fL (ref 7.5–12.5)
PLATELETS: 230 10*3/uL (ref 140–400)
RBC: 4.5 MIL/uL (ref 3.80–5.10)
RDW: 14.8 % (ref 11.0–15.0)
WBC: 8.8 10*3/uL (ref 3.8–10.8)

## 2015-10-19 LAB — BASIC METABOLIC PANEL
BUN: 17 mg/dL (ref 7–25)
CO2: 31 mmol/L (ref 20–31)
Calcium: 9.6 mg/dL (ref 8.6–10.4)
Chloride: 101 mmol/L (ref 98–110)
Creat: 0.9 mg/dL (ref 0.50–1.05)
GLUCOSE: 78 mg/dL (ref 65–99)
POTASSIUM: 4 mmol/L (ref 3.5–5.3)
Sodium: 142 mmol/L (ref 135–146)

## 2015-10-20 LAB — PROTIME-INR
INR: 1
Prothrombin Time: 10.4 s (ref 9.0–11.5)

## 2015-10-24 ENCOUNTER — Encounter (HOSPITAL_COMMUNITY)
Admission: RE | Disposition: A | Discharge status: Home / Self Care | Payer: Self-pay | Source: Ambulatory Visit | Attending: Interventional Cardiology

## 2015-10-24 ENCOUNTER — Ambulatory Visit (HOSPITAL_COMMUNITY)
Admission: RE | Admit: 2015-10-24 | Discharge: 2015-10-24 | Disposition: A | Discharge status: Home / Self Care | Payer: BLUE CROSS/BLUE SHIELD | Source: Ambulatory Visit | Attending: Interventional Cardiology | Admitting: Interventional Cardiology

## 2015-10-24 ENCOUNTER — Encounter (HOSPITAL_COMMUNITY): Payer: Self-pay | Admitting: *Deleted

## 2015-10-24 DIAGNOSIS — Z8249 Family history of ischemic heart disease and other diseases of the circulatory system: Secondary | ICD-10-CM | POA: Insufficient documentation

## 2015-10-24 DIAGNOSIS — I272 Pulmonary hypertension, unspecified: Secondary | ICD-10-CM | POA: Insufficient documentation

## 2015-10-24 DIAGNOSIS — K219 Gastro-esophageal reflux disease without esophagitis: Secondary | ICD-10-CM | POA: Diagnosis not present

## 2015-10-24 DIAGNOSIS — F329 Major depressive disorder, single episode, unspecified: Secondary | ICD-10-CM | POA: Insufficient documentation

## 2015-10-24 DIAGNOSIS — E039 Hypothyroidism, unspecified: Secondary | ICD-10-CM | POA: Diagnosis not present

## 2015-10-24 DIAGNOSIS — J449 Chronic obstructive pulmonary disease, unspecified: Secondary | ICD-10-CM | POA: Insufficient documentation

## 2015-10-24 DIAGNOSIS — Z87891 Personal history of nicotine dependence: Secondary | ICD-10-CM | POA: Insufficient documentation

## 2015-10-24 DIAGNOSIS — F419 Anxiety disorder, unspecified: Secondary | ICD-10-CM | POA: Insufficient documentation

## 2015-10-24 DIAGNOSIS — Z6841 Body Mass Index (BMI) 40.0 and over, adult: Secondary | ICD-10-CM | POA: Insufficient documentation

## 2015-10-24 DIAGNOSIS — Z880 Allergy status to penicillin: Secondary | ICD-10-CM | POA: Diagnosis not present

## 2015-10-24 DIAGNOSIS — R32 Unspecified urinary incontinence: Secondary | ICD-10-CM | POA: Diagnosis not present

## 2015-10-24 DIAGNOSIS — I5032 Chronic diastolic (congestive) heart failure: Secondary | ICD-10-CM | POA: Diagnosis not present

## 2015-10-24 DIAGNOSIS — I110 Hypertensive heart disease with heart failure: Secondary | ICD-10-CM | POA: Insufficient documentation

## 2015-10-24 DIAGNOSIS — Z801 Family history of malignant neoplasm of trachea, bronchus and lung: Secondary | ICD-10-CM | POA: Diagnosis not present

## 2015-10-24 DIAGNOSIS — I2510 Atherosclerotic heart disease of native coronary artery without angina pectoris: Secondary | ICD-10-CM | POA: Insufficient documentation

## 2015-10-24 DIAGNOSIS — R0609 Other forms of dyspnea: Secondary | ICD-10-CM

## 2015-10-24 DIAGNOSIS — Z9884 Bariatric surgery status: Secondary | ICD-10-CM | POA: Insufficient documentation

## 2015-10-24 DIAGNOSIS — Z87440 Personal history of urinary (tract) infections: Secondary | ICD-10-CM | POA: Insufficient documentation

## 2015-10-24 DIAGNOSIS — E669 Obesity, unspecified: Secondary | ICD-10-CM | POA: Diagnosis not present

## 2015-10-24 DIAGNOSIS — G4733 Obstructive sleep apnea (adult) (pediatric): Secondary | ICD-10-CM | POA: Insufficient documentation

## 2015-10-24 DIAGNOSIS — Z833 Family history of diabetes mellitus: Secondary | ICD-10-CM | POA: Insufficient documentation

## 2015-10-24 DIAGNOSIS — R0789 Other chest pain: Secondary | ICD-10-CM

## 2015-10-24 HISTORY — PX: CARDIAC CATHETERIZATION: SHX172

## 2015-10-24 LAB — POCT I-STAT 3, VENOUS BLOOD GAS (G3P V)
ACID-BASE EXCESS: 4 mmol/L — AB (ref 0.0–2.0)
BICARBONATE: 30 mmol/L — AB (ref 20.0–28.0)
O2 Saturation: 65 %
PH VEN: 7.397 (ref 7.250–7.430)
TCO2: 31 mmol/L (ref 0–100)
pCO2, Ven: 48.7 mmHg (ref 44.0–60.0)
pO2, Ven: 34 mmHg (ref 32.0–45.0)

## 2015-10-24 LAB — POCT I-STAT 3, ART BLOOD GAS (G3+)
ACID-BASE EXCESS: 3 mmol/L — AB (ref 0.0–2.0)
Bicarbonate: 28 mmol/L (ref 20.0–28.0)
O2 SAT: 93 %
TCO2: 29 mmol/L (ref 0–100)
pCO2 arterial: 41.6 mmHg (ref 32.0–48.0)
pH, Arterial: 7.436 (ref 7.350–7.450)
pO2, Arterial: 65 mmHg — ABNORMAL LOW (ref 83.0–108.0)

## 2015-10-24 LAB — POCT ACTIVATED CLOTTING TIME: Activated Clotting Time: 175 seconds

## 2015-10-24 SURGERY — RIGHT/LEFT HEART CATH AND CORONARY ANGIOGRAPHY
Anesthesia: LOCAL

## 2015-10-24 MED ORDER — IOPAMIDOL (ISOVUE-370) INJECTION 76%
INTRAVENOUS | Status: DC | PRN
  Administered 2015-10-24: 40 mL via INTRA_ARTERIAL

## 2015-10-24 MED ORDER — IOPAMIDOL (ISOVUE-370) INJECTION 76%
INTRAVENOUS | Status: AC
  Filled 2015-10-24: qty 100

## 2015-10-24 MED ORDER — FENTANYL CITRATE (PF) 100 MCG/2ML IJ SOLN
INTRAMUSCULAR | Status: DC | PRN
  Administered 2015-10-24 (×2): 25 ug via INTRAVENOUS

## 2015-10-24 MED ORDER — SODIUM CHLORIDE 0.9% FLUSH: 3.0000 mL | INTRAVENOUS | Status: DC | PRN

## 2015-10-24 MED ORDER — HEPARIN (PORCINE) IN NACL 2-0.9 UNIT/ML-% IJ SOLN
INTRAMUSCULAR | Status: AC
  Filled 2015-10-24: qty 1000

## 2015-10-24 MED ORDER — LIDOCAINE HCL (PF) 1 % IJ SOLN
INTRAMUSCULAR | Status: DC | PRN
  Administered 2015-10-24: 26 mL
  Administered 2015-10-24 (×2): 2 mL

## 2015-10-24 MED ORDER — HEPARIN (PORCINE) IN NACL 2-0.9 UNIT/ML-% IJ SOLN
INTRAMUSCULAR | Status: DC | PRN
  Administered 2015-10-24: 10 mL via INTRA_ARTERIAL

## 2015-10-24 MED ORDER — LIDOCAINE HCL (PF) 1 % IJ SOLN
INTRAMUSCULAR | Status: AC
  Filled 2015-10-24: qty 30

## 2015-10-24 MED ORDER — MIDAZOLAM HCL 2 MG/2ML IJ SOLN
INTRAMUSCULAR | Status: AC
  Filled 2015-10-24: qty 2

## 2015-10-24 MED ORDER — SODIUM CHLORIDE 0.9% FLUSH: 3.0000 mL | Freq: Two times a day (BID) | INTRAVENOUS | Status: DC

## 2015-10-24 MED ORDER — ASPIRIN 81 MG PO CHEW: 81.0000 mg | CHEWABLE_TABLET | ORAL | Status: DC

## 2015-10-24 MED ORDER — SODIUM CHLORIDE 0.9 % IV SOLN: 250.0000 mL | INTRAVENOUS | Status: DC | PRN

## 2015-10-24 MED ORDER — VERAPAMIL HCL 2.5 MG/ML IV SOLN
INTRAVENOUS | Status: AC
  Filled 2015-10-24: qty 2

## 2015-10-24 MED ORDER — NITROGLYCERIN 1 MG/10 ML FOR IR/CATH LAB
INTRA_ARTERIAL | Status: AC
  Filled 2015-10-24: qty 10

## 2015-10-24 MED ORDER — HEPARIN SODIUM (PORCINE) 1000 UNIT/ML IJ SOLN
INTRAMUSCULAR | Status: DC | PRN
  Administered 2015-10-24: 6000 [IU] via INTRAVENOUS

## 2015-10-24 MED ORDER — HEPARIN SODIUM (PORCINE) 1000 UNIT/ML IJ SOLN
INTRAMUSCULAR | Status: AC
  Filled 2015-10-24: qty 1

## 2015-10-24 MED ORDER — MIDAZOLAM HCL 2 MG/2ML IJ SOLN
INTRAMUSCULAR | Status: DC | PRN
  Administered 2015-10-24 (×2): 1 mg via INTRAVENOUS
  Administered 2015-10-24: 2 mg via INTRAVENOUS

## 2015-10-24 MED ORDER — FENTANYL CITRATE (PF) 100 MCG/2ML IJ SOLN
INTRAMUSCULAR | Status: AC
  Filled 2015-10-24: qty 2

## 2015-10-24 MED ORDER — HEPARIN (PORCINE) IN NACL 2-0.9 UNIT/ML-% IJ SOLN
INTRAMUSCULAR | Status: DC | PRN
  Administered 2015-10-24: 1000 mL

## 2015-10-24 MED ORDER — SODIUM CHLORIDE 0.9 % IV SOLN
INTRAVENOUS | Status: DC
  Administered 2015-10-24: 06:00:00 via INTRAVENOUS

## 2015-10-24 SURGICAL SUPPLY — 17 items
CATH BALLN WEDGE 5F 110CM (CATHETERS) ×1 IMPLANT
CATH IMPULSE 5F ANG/FL3.5 (CATHETERS) ×1 IMPLANT
CATH INFINITI 5FR JL4 (CATHETERS) ×1 IMPLANT
CATH SWAN GANZ 7F STRAIGHT (CATHETERS) ×1 IMPLANT
DEVICE RAD COMP TR BAND LRG (VASCULAR PRODUCTS) ×1 IMPLANT
GLIDESHEATH SLEND SS 6F .021 (SHEATH) ×1 IMPLANT
GUIDEWIRE .025 260CM (WIRE) ×1 IMPLANT
KIT HEART LEFT (KITS) ×2 IMPLANT
KIT HEART RIGHT NAMIC (KITS) ×2 IMPLANT
NDL ECHOTIP 22X10 (NEEDLE) IMPLANT
NEEDLE ECHOTIP 22X10 (NEEDLE) ×2 IMPLANT
PACK CARDIAC CATHETERIZATION (CUSTOM PROCEDURE TRAY) ×2 IMPLANT
SHEATH FAST CATH BRACH 5F 5CM (SHEATH) ×1 IMPLANT
SHEATH PINNACLE 7F 10CM (SHEATH) ×1 IMPLANT
TRANSDUCER W/STOPCOCK (MISCELLANEOUS) ×4 IMPLANT
TUBING CIL FLEX 10 FLL-RA (TUBING) ×2 IMPLANT
WIRE SAFE-T 1.5MM-J .035X260CM (WIRE) ×1 IMPLANT

## 2015-10-24 NOTE — Progress Notes (Signed)
Site area: RFV Site Prior to Removal:  Level 0 Pressure Applied For:79min Manual:  yes  Patient Status During Pull:  stable Post Pull Site:  Level 0 Post Pull Instructions Given:  yes Post Pull Pulses Present: palpable Dressing Applied:  tegaderm Bedrest begins @ 1010 till 1210 Comments:

## 2015-10-24 NOTE — Progress Notes (Signed)
Pt ambulated in hall and voided in bathroom without difficulty. Denied pain, shortness of breath or dizziness. Right groin and right radial area remain soft, no new bleeding noted after ambulation and dressing in clothes to go home.

## 2015-10-24 NOTE — Discharge Instructions (Signed)
Angiogram, Care After °Refer to this sheet in the next few weeks. These instructions provide you with information about caring for yourself after your procedure. Your health care provider may also give you more specific instructions. Your treatment has been planned according to current medical practices, but problems sometimes occur. Call your health care provider if you have any problems or questions after your procedure. °WHAT TO EXPECT AFTER THE PROCEDURE °After your procedure, it is typical to have the following: °· Bruising at the catheter insertion site that usually fades within 1-2 weeks. °· Blood collecting in the tissue (hematoma) that may be painful to the touch. It should usually decrease in size and tenderness within 1-2 weeks. °HOME CARE INSTRUCTIONS °· Take medicines only as directed by your health care provider. °· You may shower 24-48 hours after the procedure or as directed by your health care provider. Remove the bandage (dressing) and gently wash the site with plain soap and water. Pat the area dry with a clean towel. Do not rub the site, because this may cause bleeding. °· Do not take baths, swim, or use a hot tub until your health care provider approves. °· Check your insertion site every day for redness, swelling, or drainage. °· Do not apply powder or lotion to the site. °· Do not lift over 10 lb (4.5 kg) for 5 days after your procedure or as directed by your health care provider. °· Ask your health care provider when it is okay to: °¨ Return to work or school. °¨ Resume usual physical activities or sports. °¨ Resume sexual activity. °· Do not drive home if you are discharged the same day as the procedure. Have someone else drive you. °· You may drive 24 hours after the procedure unless otherwise instructed by your health care provider. °· Do not operate machinery or power tools for 24 hours after the procedure or as directed by your health care provider. °· If your procedure was done as an  outpatient procedure, which means that you went home the same day as your procedure, a responsible adult should be with you for the first 24 hours after you arrive home. °· Keep all follow-up visits as directed by your health care provider. This is important. °SEEK MEDICAL CARE IF: °· You have a fever. °· You have chills. °· You have increased bleeding from the catheter insertion site. Hold pressure on the site. °SEEK IMMEDIATE MEDICAL CARE IF: °· You have unusual pain at the catheter insertion site. °· You have redness, warmth, or swelling at the catheter insertion site. °· You have drainage (other than a small amount of blood on the dressing) from the catheter insertion site. °· The catheter insertion site is bleeding, and the bleeding does not stop after 30 minutes of holding steady pressure on the site. °· The area near or just beyond the catheter insertion site becomes pale, cool, tingly, or numb. °  °This information is not intended to replace advice given to you by your health care provider. Make sure you discuss any questions you have with your health care provider. °  °Document Released: 07/26/2004 Document Revised: 01/28/2014 Document Reviewed: 06/10/2012 °Elsevier Interactive Patient Education ©2016 [employer] °Radial Site Care °Refer to this sheet in the next few weeks. These instructions provide you with information about caring for yourself after your procedure. Your health care provider may also give you more specific instructions. Your treatment has been planned according to current medical practices, but problems sometimes occur. Call your   health care provider if you have any problems or questions after your procedure. °WHAT TO EXPECT AFTER THE PROCEDURE °After your procedure, it is typical to have the following: °· Bruising at the radial site that usually fades within 1-2 weeks. °· Blood collecting in the tissue (hematoma) that may be painful to the touch. It should usually decrease in size and  tenderness within 1-2 weeks. °HOME CARE INSTRUCTIONS °· Take medicines only as directed by your health care provider. °· You may shower 24-48 hours after the procedure or as directed by your health care provider. Remove the bandage (dressing) and gently wash the site with plain soap and water. Pat the area dry with a clean towel. Do not rub the site, because this may cause bleeding. °· Do not take baths, swim, or use a hot tub until your health care provider approves. °· Check your insertion site every day for redness, swelling, or drainage. °· Do not apply powder or lotion to the site. °· Do not flex or bend the affected arm for 24 hours or as directed by your health care provider. °· Do not push or pull heavy objects with the affected arm for 24 hours or as directed by your health care provider. °· Do not lift over 10 lb (4.5 kg) for 5 days after your procedure or as directed by your health care provider. °· Ask your health care provider when it is okay to: °¨ Return to work or school. °¨ Resume usual physical activities or sports. °¨ Resume sexual activity. °· Do not drive home if you are discharged the same day as the procedure. Have someone else drive you. °· You may drive 24 hours after the procedure unless otherwise instructed by your health care provider. °· Do not operate machinery or power tools for 24 hours after the procedure. °· If your procedure was done as an outpatient procedure, which means that you went home the same day as your procedure, a responsible adult should be with you for the first 24 hours after you arrive home. °· Keep all follow-up visits as directed by your health care provider. This is important. °SEEK MEDICAL CARE IF: °· You have a fever. °· You have chills. °· You have increased bleeding from the radial site. Hold pressure on the site. °SEEK IMMEDIATE MEDICAL CARE IF: °· You have unusual pain at the radial site. °· You have redness, warmth, or swelling at the radial site. °· You  have drainage (other than a small amount of blood on the dressing) from the radial site. °· The radial site is bleeding, and the bleeding does not stop after 30 minutes of holding steady pressure on the site. °· Your arm or hand becomes pale, cool, tingly, or numb. °  °This information is not intended to replace advice given to you by your health care provider. Make sure you discuss any questions you have with your health care provider. °  °Document Released: 02/09/2010 Document Revised: 01/28/2014 Document Reviewed: 07/26/2013 °Elsevier Interactive Patient Education ©2016 [employer] ° °

## 2015-10-24 NOTE — Interval H&P Note (Signed)
Cath Lab Visit (complete for each Cath Lab visit)  Clinical Evaluation Leading to the Procedure:   ACS: No.  Non-ACS:    Anginal Classification: CCS III  Anti-ischemic medical therapy: Minimal Therapy (1 class of medications)  Non-Invasive Test Results: Intermediate-risk stress test findings: cardiac mortality 1-3%/year  Prior CABG: No previous CABG      History and Physical Interval Note:  10/24/2015 7:42 AM  Meghan Hoover  has presented today for surgery, with the diagnosis of cp  The various methods of treatment have been discussed with the patient and family. After consideration of risks, benefits and other options for treatment, the patient has consented to  Procedure(s): Right/Left Heart Cath and Coronary Angiography (N/A) as a surgical intervention .  The patient's history has been reviewed, patient examined, no change in status, stable for surgery.  I have reviewed the patient's chart and labs.  Questions were answered to the patient's satisfaction.     Larae Grooms

## 2015-10-24 NOTE — H&P (View-Only) (Signed)
Clinical Summary Ms. Dobey is a 55 y.o.female seen today for follow up of the following medical problems.   1. Chronic diastolic heart failure - denies any SOB or DOE. No significant LE edema - since her bariatric surgery and significant weight loss her fluid status and breathing symptoms have improved  - recent weight gain, increased SOB/DOE.   2. OSA  - she is compliant with CPAP machine.   3. HTN - compliant with meds - checks at home daily, typically 110s/60s  4. Obesity - s/p gastric bypass 06/2014. Continues to lose weight since surgery, down another 50 lbs since our last visit. .   5. COPD - followed by pulmonary  6. Chest pain - occurs with walking. Pain starts in chest and radiates down left arm.  - dull pain, heaviness. 6-7/10. Never occurs at rest  - started a few months ago, when she started increasing her activity/exercise levels - lasts just a few seconds. No other associated symptoms. Resolves with rest.  - increase in frequency since onset.  - recent weight gain. Recently she increased to torsemide 20mg  bid with mild improvement.     CAD risk factprs: former tobacco, HTN, Father MI 19 Past Medical History:  Diagnosis Date  . Anxiety   . Bilateral lower extremity edema    hx of 2014   . Bronchitis    hx of   . CHF (congestive heart failure) ([place])   . COPD (chronic obstructive pulmonary disease) ([place])   . Coronary artery disease   . Depression   . Dysphagia   . GERD (gastroesophageal reflux disease)   . Heart failure ([place])   . History of frequent urinary tract infections    last one 10 years ago   . Hypertension   . Hypothyroid   . Numbness    right hand since 2014  . Pneumonia    2014  . Shortness of breath dyspnea    with exertion   . Sleep apnea    uses CPAP  . Urinary incontinence      Allergies  Allergen Reactions  . Augmentin [Amoxicillin-Pot Clavulanate] Other (See Comments)    thrush     Current Outpatient  Prescriptions  Medication Sig Dispense Refill  . Biotin (BIOTIN MAXIMUM STRENGTH) 10 MG TABS Take by mouth.    . Calcium Carb-Cholecalciferol (CALCIUM 600 + D PO) Take 1 tablet by mouth 3 (three) times daily.    . Cholecalciferol (VITAMIN D3) 2000 UNITS TABS Take 1 tablet by mouth every morning.     . colchicine 0.6 MG tablet Take 1 tablet by mouth daily as needed.    . Cyanocobalamin (VITAMIN B-12 CR PO) Take 1 tablet by mouth every morning.     . diltiazem (DILACOR XR) 180 MG 24 hr capsule Take 1 capsule (180 mg total) by mouth daily. (Patient taking differently: Take 180 mg by mouth at bedtime. ) 90 capsule 3  . esomeprazole (NEXIUM) 40 MG capsule Take 40 mg by mouth every morning.     . fluticasone (FLONASE) 50 MCG/ACT nasal spray Place 2 sprays into both nostrils 2 (two) times daily. 48 g 3  . levothyroxine (SYNTHROID, LEVOTHROID) 75 MCG tablet Take 75 mcg by mouth daily before breakfast.    . lidocaine (LIDODERM) 5 % Place 1-2 patches onto the skin daily.     Marland Kitchen loratadine (CLARITIN) 10 MG tablet Take 10 mg by mouth every morning. Alternates with Zyrtec.    . Multiple Vitamins-Minerals (MULTIVITAMIN  WITH MINERALS) tablet Take 1 tablet by mouth every morning.     Marland Kitchen PROAIR HFA 108 (90 Base) MCG/ACT inhaler INHALE 2 PUFFS INTO THE LUNGS EVERY 6 (SIX) HOURS AS NEEDED FOR WHEEZING OR SHORTNESS OF BREATH. 8.5 Inhaler 3  . torsemide (DEMADEX) 20 MG tablet TAKE 1 TABLET BY MOUTH TWICE A DAY 180 tablet 3   No current facility-administered medications for this visit.      Past Surgical History:  Procedure Laterality Date  . ACHILLES TENDON SURGERY    . BACK SURGERY     x4  . COLONOSCOPY N/A 05/24/2013   Procedure: COLONOSCOPY;  Surgeon: Daneil Dolin, MD;  [facility];  Service: Endoscopy;  Laterality: N/A;  3:00 PM  . GASTROSTOMY W/ FEEDING TUBE     hx of 2014   . LAPAROSCOPIC ROUX-EN-Y GASTRIC BYPASS WITH UPPER ENDOSCOPY AND REMOVAL OF LAP BAND N/A 06/27/2014   Procedure:  LAPAROSCOPIC ROUX-EN-Y GASTRIC BYPASS   WITH UPPER ENDOSCOPY;  Surgeon: Johnathan Hausen, MD;  Location: [facility];  Service: General;  Laterality: N/A;  . swallowing evaluation      hx of   . TONSILECTOMY, ADENOIDECTOMY, BILATERAL MYRINGOTOMY AND TUBES    . TONSILLECTOMY    . TRACHEOSTOMY     11/2012  . TRACHEOSTOMY CLOSURE     11/2012  . UPPER GI ENDOSCOPY  06/27/2014   Procedure: UPPER GI ENDOSCOPY;  Surgeon: Johnathan Hausen, MD;  Location: [facility];  Service: General;;     Allergies  Allergen Reactions  . Augmentin [Amoxicillin-Pot Clavulanate] Other (See Comments)    thrush      Family History  Problem Relation Age of Onset  . Heart disease Mother   . Emphysema Father   . Heart disease Father   . Cancer Father     lung  . Diabetes Sister   . Asthma Sister   . Colon cancer Neg Hx      Social History Ms. Winklepleck reports that she quit smoking about 2 years ago. Her smoking use included Cigarettes. She started smoking about 44 years ago. She has a 120.00 pack-year smoking history. She has never used smokeless tobacco. Ms. Wenck reports that she does not drink alcohol.   Review of Systems CONSTITUTIONAL: No weight loss, fever, chills, weakness or fatigue.  HEENT: Eyes: No visual loss, blurred vision, double vision or yellow sclerae.No hearing loss, sneezing, congestion, runny nose or sore throat.  SKIN: No rash or itching.  CARDIOVASCULAR: per HPI RESPIRATORY:per HPI GASTROINTESTINAL: No anorexia, nausea, vomiting or diarrhea. No abdominal pain or blood.  GENITOURINARY: No burning on urination, no polyuria NEUROLOGICAL: No headache, dizziness, syncope, paralysis, ataxia, numbness or tingling in the extremities. No change in bowel or bladder control.  MUSCULOSKELETAL: No muscle, back pain, joint pain or stiffness.  LYMPHATICS: No enlarged nodes. No history of splenectomy.  PSYCHIATRIC: No history of depression or anxiety.  ENDOCRINOLOGIC: No reports of sweating, cold or heat  intolerance. No polyuria or polydipsia.  Marland Kitchen   Physical Examination Vitals:   10/18/15 1447  BP: 102/62  Pulse: 68   Vitals:   10/18/15 1447  Weight: (!) 310 lb (140.6 kg)  Height: 5\' 8"  (1.727 m)    Gen: resting comfortably, no acute distress HEENT: no scleral icterus, pupils equal round and reactive, no palptable cervical adenopathy,  CV: RRR, no m/r/g, no jvd Resp: Clear to auscultation bilaterally GI: abdomen is soft, non-tender, non-distended, normal bowel sounds, no hepatosplenomegaly MSK: extremities are warm, no edema.  Skin: warm, no rash Neuro:  no focal deficits Psych: appropriate affect   Diagnostic Studies 06/2013 Exercise MPI Baseline tracing shows sinus rhythm at 91 beats per min with poor weight progression. Patient was exercised by Bruce protocol for 2 min and 46 seconds achieving a maximum work load of 4.6 METS. Heart rate increased from 94 beats per min up to 146 beats per min which was 87% of the maximum age predicted heart rate. Blood pressure increased from 154/76 up to 200/72. No chest pain rule was reported, however exercise was limited by shortness of breath and fatigue. There were no clearly diagnostic ST segment abnormalities, and no arrhythmias were noted.  Analysis of the overall perfusion data finds somewhat heterogeneous radiotracer uptake with evidence of breast attenuation.  Tomographic views were obtained using the short axis, vertical long axis, and horizontal long axis planes. There is a small, mild intensity basal anterior defect that is fixed and consistent with soft tissue attenuation. There is also a small, moderate intensity inferior wall defect that is generally more prominent at rest than stress, overall fixed with partial reversibility in the mid inferoseptal and inferolateral segment. Suspect that this is related to variable diaphragmatic attenuation, however cannot completely exclude a mild degree of ischemia.  Gated  imaging reveals an EDV of 109, ESV of 20, TID ratio 0.69, and LVEF of 75% with normal wall motion.  IMPRESSION: Low risk exercise Cardiolite as outlined. Patient had limited exercise tolerance due to shortness of breath and fatigue, achieved a maximum work load of 4.6 METS. There were no diagnostic ST segment abnormalities or arrhythmias. Perfusion imaging is most consistent with breast tissue attenuation as well as variable diaphragmatic attenuation, cannot completely exclude a mild degree of inferior ischemia as outlined. LVEF is vigorous at 75% with normal volumes and wall motion.  12/04/2012 Echo  Technically difficult study, LVEF 0000000, grade I diastolic dysfunction, mild AS, cannot estimate PASP, normal IVC, normal RV   11/24/12 Echo  LVEF 55-60%, moderate RV dilatation with normal function   11/20/12 Echo  LVEF 65-70%, grade II diastolic dysfunction, flattened RV septum in diastole and systole, severe RV dilatatoin, PASP 71mmHg   02/2013 PFTs Obstruction and diffusion defect suggesting emphysema, however absence of hyperinflaction. Overall minimal obstructive disease.      Assessment and Plan  1. Chronic Diastolic heart failure  - recent weight gain, increased SOB/DOE. Exam limited due to body habiuts regarding fluid status. - continue increased torsemide. With upcoming LHC will ask for RHC as well.  - EKG in clinic NSR - start ASA 81mg  daily   2. OSA - continue CPAP  3. HTN - at goal, we will continue current meds  4. Chest pain - exertional chest pain with associated SOB and DOE. REsolves with rest. Increasing in frequency since onset. Casimer Lanius 2015 probable artifact but could not rule out ischemia - given her progressing symptoms, will plan for definitive LHC to evaluate. Given her weight gain and increased SOB/DOE, and difficulty assessing volume status by exam will ask for RHC as well   I have reviewed the risks, indications, and  alternatives to cardiac catheterization, possible angioplasty, and stenting with the patient. Risks include but are not limited to bleeding, infection, vascular injury, stroke, myocardial infection, arrhythmia, kidney injury, radiation-related injury in the case of prolonged fluoroscopy use, emergency cardiac surgery, and death. The patient understands the risks of serious complication is 1-2 in [id number] with diagnostic cardiac cath and 1-2% or less with angioplasty/stenting.  F/u 4 weeks.   Arnoldo Lenis, M.D.,

## 2015-10-25 MED FILL — Nitroglycerin IV Soln 100 MCG/ML in D5W: INTRA_ARTERIAL | Qty: 10 | Status: AC

## 2015-10-29 ENCOUNTER — Other Ambulatory Visit: Payer: Self-pay | Admitting: Emergency Medicine

## 2015-10-29 DIAGNOSIS — J309 Allergic rhinitis, unspecified: Secondary | ICD-10-CM

## 2015-10-30 ENCOUNTER — Telehealth: Payer: Self-pay

## 2015-10-30 NOTE — Telephone Encounter (Signed)
Pt takes as directed sometimes even an extra if she feels legs are heavy.was told by bariatric MD not to weight self daily but I told her weigh self daily.She states she was taking extra torsemide if she had gained weight.she also reports she will decrease her sodium intake

## 2015-10-30 NOTE — Telephone Encounter (Signed)
-----   Message from Arnoldo Lenis, MD sent at 10/24/2015 10:12 AM EDT ----- Cath looks good, no blockages in arteries of heart. There is some evidence of extra fluid based on some of the pressures measured. Please verify how she is currently taking her torsemide.  Zandra Abts MD ----- Message ----- From: Jettie Booze, MD Sent: 10/24/2015   9:32 AM To: Arnoldo Lenis, MD

## 2015-10-31 NOTE — Telephone Encounter (Signed)
Pt will increase dose as suggested by Dr Harl Bowie

## 2015-10-31 NOTE — Telephone Encounter (Signed)
Verify she has been taking torsemide 20mg  bid daily. If so then would increase to 40mg  bid x 3 days, then resume prior 20mg  bid dosing.    Zandra Abts MD

## 2015-11-14 ENCOUNTER — Ambulatory Visit: Payer: BLUE CROSS/BLUE SHIELD | Admitting: Dietician

## 2015-11-21 ENCOUNTER — Encounter: Payer: BLUE CROSS/BLUE SHIELD | Admitting: Adult Health

## 2015-11-29 ENCOUNTER — Encounter: Payer: Self-pay | Admitting: Physician Assistant

## 2015-11-29 ENCOUNTER — Ambulatory Visit (INDEPENDENT_AMBULATORY_CARE_PROVIDER_SITE_OTHER): Payer: BLUE CROSS/BLUE SHIELD | Admitting: Physician Assistant

## 2015-11-29 VITALS — BP 136/84 | HR 68 | Ht 68.0 in | Wt 312.0 lb

## 2015-11-29 DIAGNOSIS — I10 Essential (primary) hypertension: Secondary | ICD-10-CM | POA: Diagnosis not present

## 2015-11-29 DIAGNOSIS — R0789 Other chest pain: Secondary | ICD-10-CM | POA: Diagnosis not present

## 2015-11-29 DIAGNOSIS — I5032 Chronic diastolic (congestive) heart failure: Secondary | ICD-10-CM | POA: Diagnosis not present

## 2015-11-29 NOTE — Patient Instructions (Signed)
Your physician wants you to follow-up in: 6 Months with Dr. Branch. You will receive a reminder letter in the mail two months in advance. If you don't receive a letter, please call our office to schedule the follow-up appointment.  Your physician recommends that you continue on your current medications as directed. Please refer to the Current Medication list given to you today.  If you need a refill on your cardiac medications before your next appointment, please call your pharmacy.  Thank you for choosing [facility]!   

## 2015-11-29 NOTE — Progress Notes (Signed)
Cardiology Office Note    Date:  11/29/2015   ID:  Meghan Hoover, DOB 1960/08/30, MRN [id number]  PCP:  Purvis Kilts, MD  Cardiologist: Dr. Harl Bowie  Chief Complaint  Patient presents with  . Follow-up    History of Present Illness:  Meghan Hoover is a 55 y.o. female with history of chronic diastolic CHF, OSA on SPAP, HTN, Obesity S/P gastric bypass 06/2014, COPD. She had chest pain recently and underwent LHC/RHC 10/24/15 noonobstructive mild CAD, moderately elevated LVEDP, mild pulmonary HTN, PA sat 65%. Diureticwas increased for 3 days.  Patient still complains of chest tightness that occurs 910 or 11:00 at night. She says it comes and goes very quickly. She does admit to eating chocolate, Reeses cups and drinking 3 sodas a day. She knows she shouldn't be doing this and it could be contributing to her symptoms. She waits until her husband goes to bed to do this because she knows he would be upset. She still has edema but it has gotten better.    Past Medical History:  Diagnosis Date  . Anxiety   . Bilateral lower extremity edema    hx of 2014   . Bronchitis    hx of   . CHF (congestive heart failure) ([place])   . COPD (chronic obstructive pulmonary disease) ([place])   . Coronary artery disease   . Depression   . Dysphagia   . GERD (gastroesophageal reflux disease)   . Heart failure ([place])   . History of frequent urinary tract infections    last one 10 years ago   . Hypertension   . Hypothyroid   . Numbness    right hand since 2014  . Pneumonia    2014  . Shortness of breath dyspnea    with exertion   . Sleep apnea    uses CPAP  . Urinary incontinence     Past Surgical History:  Procedure Laterality Date  . ACHILLES TENDON SURGERY    . BACK SURGERY     x4  . CARDIAC CATHETERIZATION N/A 10/24/2015   Procedure: Right/Left Heart Cath and Coronary Angiography;  Surgeon: Jettie Booze, MD;  Location: [facility];  Service: Cardiovascular;  Laterality:  N/A;  . COLONOSCOPY N/A 05/24/2013   Procedure: COLONOSCOPY;  Surgeon: Daneil Dolin, MD;  [facility];  Service: Endoscopy;  Laterality: N/A;  3:00 PM  . GASTROSTOMY W/ FEEDING TUBE     hx of 2014   . LAPAROSCOPIC ROUX-EN-Y GASTRIC BYPASS WITH UPPER ENDOSCOPY AND REMOVAL OF LAP BAND N/A 06/27/2014   Procedure: LAPAROSCOPIC ROUX-EN-Y GASTRIC BYPASS   WITH UPPER ENDOSCOPY;  Surgeon: Johnathan Hausen, MD;  Location: [facility];  Service: General;  Laterality: N/A;  . swallowing evaluation      hx of   . TONSILECTOMY, ADENOIDECTOMY, BILATERAL MYRINGOTOMY AND TUBES    . TONSILLECTOMY    . TRACHEOSTOMY     11/2012  . TRACHEOSTOMY CLOSURE     11/2012  . UPPER GI ENDOSCOPY  06/27/2014   Procedure: UPPER GI ENDOSCOPY;  Surgeon: Johnathan Hausen, MD;  Location: [facility];  Service: General;;    Current Medications: Outpatient Medications Prior to Visit  Medication Sig Dispense Refill  . Acetaminophen (MAPAP ACETAMINOPHEN EXTRA STR) 167 MG/5ML LIQD Take 15 mLs by mouth every 6 (six) hours as needed (for pain.).    Marland Kitchen aspirin EC 81 MG tablet Take 81 mg by mouth daily.    . Biotin (BIOTIN MAXIMUM  STRENGTH) 10 MG TABS Take 10 mg by mouth daily.     . Calcium Carb-Cholecalciferol (CALCIUM 600 + D PO) Take 1 tablet by mouth 3 (three) times daily.    . celecoxib (CELEBREX) 200 MG capsule Take 200 mg by mouth daily as needed (for arthritic pain.).   1  . cetirizine (ZYRTEC) 10 MG tablet Take 10 mg by mouth daily.    . Cholecalciferol (VITAMIN D3) 2000 UNITS TABS Take 2,000 Units by mouth every morning.     . clotrimazole-betamethasone (LOTRISONE) cream Apply 1 application topically daily as needed. For skin rashes.    . colchicine 0.6 MG tablet Take 1 tablet by mouth daily as needed.    . Cyanocobalamin (VITAMIN B-12 CR PO) Place 5,000 mcg under the tongue every morning.     . diltiazem (DILACOR XR) 180 MG 24 hr capsule Take 1 capsule (180 mg total) by mouth daily. (Patient taking differently: Take 180 mg  by mouth at bedtime. ) 90 capsule 3  . escitalopram (LEXAPRO) 10 MG tablet Take 10 mg by mouth daily.  1  . esomeprazole (NEXIUM) 40 MG capsule Take 40 mg by mouth every morning.     . fluticasone (FLONASE) 50 MCG/ACT nasal spray PLACE 2 SPRAYS INTO BOTH NOSTRILS 2 (TWO) TIMES DAILY. 16 g 2  . HYDROcodone-acetaminophen (NORCO/VICODIN) 5-325 MG tablet Take 1 tablet by mouth every 6 (six) hours as needed for moderate pain.    . Hylan (SYNVISC IX) Inject 1 applicator into the articular space every 6 (six) months.    . levothyroxine (SYNTHROID, LEVOTHROID) 75 MCG tablet Take 75 mcg by mouth daily before breakfast.    . lidocaine (LIDODERM) 5 % Place 1-2 patches onto the skin daily.     . Liniments (BLUE-EMU SUPER STRENGTH) CREA Apply 1 application topically 3 (three) times daily as needed (for joint pain.).    Marland Kitchen Multiple Vitamins-Minerals (MULTIVITAMIN WITH MINERALS) tablet Take 1 tablet by mouth 2 (two) times daily.     . Olopatadine HCl (PATADAY) 0.2 % SOLN Apply 1 drop to eye daily.     Marland Kitchen PROAIR HFA 108 (90 Base) MCG/ACT inhaler INHALE 2 PUFFS INTO THE LUNGS EVERY 6 (SIX) HOURS AS NEEDED FOR WHEEZING OR SHORTNESS OF BREATH. 8.5 Inhaler 3  . Propylene Glycol-Glycerin (SOOTHE) 0.6-0.6 % SOLN Apply 1 drop to eye 3 (three) times daily.    . Simethicone (GAS-X EXTRA STRENGTH) 62.5 MG STRP Take 2-4 strips by mouth 4 (four) times daily as needed (for gas). **do not exceed 8 strips in 24 hrs.**    . torsemide (DEMADEX) 20 MG tablet TAKE 1 TABLET BY MOUTH TWICE A DAY 180 tablet 3  . traZODone (DESYREL) 50 MG tablet Take 50 mg by mouth at bedtime.   1   No facility-administered medications prior to visit.      Allergies:   Augmentin [amoxicillin-pot clavulanate]   Social History   Social History  . Marital status: Married    Spouse name: N/A  . Number of children: N/A  . Years of education: N/A   Occupational History  . retired Retired    Therapist, art   Social History Main Topics  .  Smoking status: Former Smoker    Packs/day: 3.00    Years: 40.00    Types: Cigarettes    Start date: 01/22/1971    Quit date: 11/19/2012  . Smokeless tobacco: Never Used  . Alcohol use No     Comment: social drank quit 5 years ago   .  Drug use: No  . Sexual activity: Not Asked   Other Topics Concern  . None   Social History Narrative  . None     Family History:  The patient's family history includes Asthma in her sister; Cancer in her father; Diabetes in her sister; Emphysema in her father; Heart disease in her father and mother.   ROS:   Please see the history of present illness.    Review of Systems  Constitution: Negative.  HENT: Negative.   Eyes: Negative.   Cardiovascular: Positive for chest pain, dyspnea on exertion and leg swelling.  Respiratory: Negative.   Hematologic/Lymphatic: Negative.   Musculoskeletal: Negative.  Negative for joint pain.  Gastrointestinal: Positive for abdominal pain and heartburn.  Genitourinary: Negative.   Neurological: Negative.    All other systems reviewed and are negative.   PHYSICAL EXAM:   VS:  BP 136/84   Pulse 68   Ht 5\' 8"  (1.727 m)   Wt (!) 312 lb (141.5 kg)   SpO2 97%   BMI 47.44 kg/m   Physical Exam  GEN: Obese, in no acute distress  Neck: no JVD, carotid bruits, or masses Cardiac:RRR; no murmurs, rubs, or gallops  Respiratory:  clear to auscultation bilaterally, normal work of breathing GI: soft, nontender, nondistended, + BS Ext: +1 edema bilaterally without cyanosis, clubbing,  Good distal pulses bilaterally MS: no deformity or atrophy  Skin: warm and dry, no rash Neuro:  Alert and Oriented x 3, Strength and sensation are intact Psych: euthymic mood, full affect  Wt Readings from Last 3 Encounters:  11/29/15 (!) 312 lb (141.5 kg)  10/24/15 (!) 305 lb (138.3 kg)  10/18/15 (!) 310 lb (140.6 kg)      Studies/Labs Reviewed:   EKG:  EKG is not ordered today.    Recent Labs: 10/19/2015: BUN 17; Creat 0.90;  Hemoglobin 13.7; Platelets 230; Potassium 4.0; Sodium 142   Lipid Panel    Component Value Date/Time   TRIG 162 (H) 12/04/2012 0410    Additional studies/ records that were reviewed today include:  LHC/RHC 10/24/15: Conclusion     Nonobstructive, mild CAD.  LV end diastolic pressure is moderately elevated.  Hemodynamic findings consistent with mild pulmonary hypertension.  PA sat 65%. CO 6.2 L/min. Cardiac index 2.5.   Moderately increased LVEDP may be contributing to shortness of breath.         Prior Cardiac Testing/Procedures 1. Echocardiogram 11/20/2012 Left ventricle: The cavity size was normal. There was mild concentric hypertrophy. Systolic function was vigorous. The estimated ejection fraction was in the range of 65% to 70%. Images were inadequate for LV wall motion assessment. Features are consistent with a pseudonormal left ventricular filling pattern, with concomitant abnormal relaxation and increased filling pressure (grade 2 diastolic dysfunction). - Ventricular septum: The contour showed diastolic flattening and systolic flattening consistent with increased RV volume and pressure. - Aortic valve: Poorly visualized. Mildly calcified leaflets. No significant regurgitation. - Mitral valve: Mildly thickened leaflets . Trivial regurgitation. - Left atrium: The atrium was mildly dilated. - Right ventricle: The cavity size was severely dilated. Systolic function was reduced. - Right atrium: The atrium was moderately dilated. - Tricuspid valve: Mild regurgitation. - Pulmonary arteries: Systolic pressure was moderately increased. PA peak pressure: 62mm Hg (S). - Pericardium, extracardiac: There was no pericardial effusion.  No prior study for comparison. Images are quite limited. There is mild LVH with LVEF approximately 65-70%. Grade 2 diastolic dysfunction with increased filling pressures noted. Mild left atrial enlargement.  Mildly thickened mitral valve  with trivial mitral regurgitation. Aortic valve is mildly calcified and poorly seen overall. There is severe RV dilatation with at least mildly reduced function, somewhat difficult to assess. LV septal flattening is consistent with increased RV volume and pressure. Probable mild tricuspid regurgitation, PASP 50 mmHg with increased CVP. Consistent with moderate pulmonary hypertension. With clinical evidence of edema and volume overload, study would be consistent with components of diastolic heart failure as well as cor pulmonale.     ASSESSMENT:    1. Chronic diastolic congestive heart failure ([place])   2. Essential hypertension   3. Other chest pain      PLAN:  In order of problems listed above:  Chronic diastolic heart failure improved after 3 days of extra torsemide. Still having mild edema and dyspnea on exertion. Increased right heart pressures at cardiac cath with mild nonobstructive CAD. Continue torsemide 20 mg twice a day and extra as needed for weight gain and edema.  Essential hypertension controlled  Chest pain somewhat atypical and related to her eating at night. Discuss stopping late-night eating, chocolate and sodas.    Medication Adjustments/Labs and Tests Ordered: Current medicines are reviewed at length with the patient today.  Concerns regarding medicines are outlined above.  Medication changes, Labs and Tests ordered today are listed in the Patient Instructions below. Patient Instructions  Your physician wants you to follow-up in: 6 Months with Dr. Harl Bowie. You will receive a reminder letter in the mail two months in advance. If you don't receive a letter, please call our office to schedule the follow-up appointment.  Your physician recommends that you continue on your current medications as directed. Please refer to the Current Medication list given to you today.  If you need a refill on your cardiac medications before your next appointment, please call your  pharmacy.  Thank you for choosing [facility]!      Signed, Ermalinda Barrios, PA-C  11/29/2015 1:51 PM    [facility] [address], [place], [place]  [address] Phone: [phone]; Fax: [phone]

## 2015-12-11 ENCOUNTER — Ambulatory Visit (INDEPENDENT_AMBULATORY_CARE_PROVIDER_SITE_OTHER): Payer: BLUE CROSS/BLUE SHIELD | Admitting: Emergency Medicine

## 2015-12-11 ENCOUNTER — Encounter: Payer: Self-pay | Admitting: Emergency Medicine

## 2015-12-11 DIAGNOSIS — J309 Allergic rhinitis, unspecified: Secondary | ICD-10-CM

## 2015-12-11 MED ORDER — TIOTROPIUM BROMIDE MONOHYDRATE 18 MCG IN CAPS: 18.0000 ug | ORAL_CAPSULE | Freq: Every day | RESPIRATORY_TRACT | 3 refills | Status: DC

## 2015-12-11 MED ORDER — FLUTICASONE PROPIONATE 50 MCG/ACT NA SUSP: 2.0000 | Freq: Two times a day (BID) | NASAL | 3 refills | Status: DC

## 2015-12-11 NOTE — Patient Instructions (Addendum)
We will restart Spiriva once a day  Take albuterol 2 puffs up to every 4 hours if needed for shortness of breath.  Continue flonase and zyrtec as you are taking them  Continue your CPAP every night Flu shot up to date.  Follow with Dr Lamonte Sakai in 6 months or sooner if you have any problems

## 2015-12-11 NOTE — Assessment & Plan Note (Signed)
Continue to use CPAP every night, AutoSet 5-20

## 2015-12-11 NOTE — Assessment & Plan Note (Signed)
Zyrtec and flonase

## 2015-12-11 NOTE — Assessment & Plan Note (Signed)
We will restart Spiriva once a day  Take albuterol 2 puffs up to every 4 hours if needed for shortness of breath.  Continue flonase and zyrtec as you are taking them  Flu shot up to date.  Follow with Dr Lamonte Sakai in 6 months or sooner if you have any problems

## 2015-12-11 NOTE — Progress Notes (Signed)
Subjective:    Patient ID: Meghan Hoover, female    DOB: March 26, 1960, 55 y.o.   MRN: [id number]  Shortness of Breath  Associated symptoms include leg swelling and rhinorrhea. Pertinent negatives include no ear pain, fever, headaches, rash, sore throat, vomiting or wheezing.   55 yo woman, former smoker (34 pk-yrs), OSA, HTN, GERD. Also with hx critical care illness due to cor pulmonale in setting of apparent LE cellulitis with intubation, trach, LTAC admission in 11/'14. She is referred today for dyspnea, possible COPD. She underwent PFT 03/12/13, shows mild to mod mixed disease, no BD response.  She has exertional SOB, some congestion. Coughs up phlegm regularly. Good CPAP compliance.   ROV 05/27/13 -- follows up for dyspnea, OSA/OHS, COPD based on mixed disease on spiro.  She did not desaturate but only walked one lap. We did trial Spiriva last time > she feels that she has benefited. Able to do more activity > she has started spring cleaning. Wears her CPAP mask reliably.   ROV 10/04/14 -- Follow-up visit for dyspnea, obstructive sleep apnea/obesity hyperventilation syndrome, and mixed disease on spirometry which we have ascribed to COPD (80-pack-year history). Since her last visit she has experienced one exacerbation proximally one year ago. Importantly she has also undergone gastric bypass surgery in June 2016. She has lost 79 lbs!  Her breathing has improved. Less albuterol use. She is compliant with BiPAP at 10/5. She needs her fluticasone nasal spray refilled  ROV 12/09/14 -- follow-up for obstructive sleep apnea/obesity hypoventilation syndrome, COPD. She underwent a home BiPAP titration study, AutoSet titration that showed adequate control on CPAP 10. She rarely needs albuterol.   ROV 06/08/15 -- patient with a history of obesity, obstructive sleep apnea/obesity hypoventilation syndrome,former tobacco use and COPD based on mixed disease from spirometry 03/12/13. She returns today for regular  follow-up. Currently maintained on all her titration CPAP (range 5-20 cm water) based on her most recent titration study. She has lost 13 more pounds since our last visit. She remains on loratadine, Flonase, Nexium.  She stopped her Spiriva since our last visit due to expense when her insurance deductible had not been met. She seems to have tolerated this. Some increased SOB with the warm weather. She has been active, better able to exert. Uses proventil 2-3x a day.   ROV 12/11/15 -- follow up visit for her COPD, OSA / OHS on CPAP auto-set. Compliance data from the last 30 days is available for review,  she used the device 100% of the time for > 4 hours. Feels that she is clearly benefiting. Less daytime sleepiness, more energy. No nocturnal wakeups. She stopped Spiriva on her own, but now feels that she needs to restart.    Review of Systems  Constitutional: Negative for fever and unexpected weight change.  HENT: Positive for congestion, postnasal drip, rhinorrhea and sinus pressure. Negative for dental problem, ear pain, nosebleeds, sneezing, sore throat and trouble swallowing.   Eyes: Negative for redness and itching.  Respiratory: Positive for cough and shortness of breath. Negative for chest tightness and wheezing.   Cardiovascular: Positive for leg swelling. Negative for palpitations.  Gastrointestinal: Negative for nausea and vomiting.  Genitourinary: Negative for dysuria.  Musculoskeletal: Negative for joint swelling.  Skin: Negative for rash.  Neurological: Negative for headaches.  Hematological: Does not bruise/bleed easily.  Psychiatric/Behavioral: Negative for dysphoric mood. The patient is not nervous/anxious.        Objective:   Physical Exam.  Vitals:  12/11/15 0854  BP: 132/80  BP Location: Left Arm  Cuff Size: Large  Pulse: 67  SpO2: 97%  Weight: (!) 324 lb (147 kg)  Height: _0  (1.727 m)   Gen: Pleasant, obese woman, she has clearly lost weight, in no distress,   normal affect  ENT: No lesions,  mouth clear,  oropharynx clear, no postnasal drip  Neck: No JVD, no TMG, no carotid bruits, well healed trach scar  Lungs: No use of accessory muscles, small volumes, clear without rales or rhonchi  Cardiovascular: RRR, heart sounds normal, no murmur or gallops, no peripheral edema  Musculoskeletal: No deformities, no cyanosis or clubbing  Neuro: alert, non focal  Skin: Warm, no lesions or rashes      Assessment & Plan:  Obstructive sleep apnea Continue to use CPAP every night, AutoSet 5-20  COPD (chronic obstructive pulmonary disease) We will restart Spiriva once a day  Take albuterol 2 puffs up to every 4 hours if needed for shortness of breath.  Continue flonase and zyrtec as you are taking them  Flu shot up to date.  Follow with Dr Lamonte Sakai in 6 months or sooner if you have any problems  Allergic rhinitis Zyrtec and flonase   Baltazar Apo, MD, PhD 12/11/2015, 9:19 AM [facility] [phone] or if no answer [phone]

## 2016-02-12 ENCOUNTER — Other Ambulatory Visit: Payer: Self-pay | Admitting: Cardiology

## 2016-12-08 ENCOUNTER — Other Ambulatory Visit: Payer: Self-pay | Admitting: Emergency Medicine

## 2016-12-09 ENCOUNTER — Other Ambulatory Visit: Payer: Self-pay | Admitting: Emergency Medicine

## 2016-12-09 DIAGNOSIS — J309 Allergic rhinitis, unspecified: Secondary | ICD-10-CM

## 2017-01-01 ENCOUNTER — Other Ambulatory Visit: Payer: Self-pay | Admitting: Emergency Medicine

## 2017-01-05 ENCOUNTER — Other Ambulatory Visit: Payer: Self-pay | Admitting: Emergency Medicine

## 2017-01-05 DIAGNOSIS — J309 Allergic rhinitis, unspecified: Secondary | ICD-10-CM

## 2017-01-29 ENCOUNTER — Encounter (HOSPITAL_COMMUNITY): Payer: Self-pay

## 2017-01-30 ENCOUNTER — Other Ambulatory Visit: Payer: Self-pay | Admitting: Cardiology

## 2019-07-02 ENCOUNTER — Ambulatory Visit (INDEPENDENT_AMBULATORY_CARE_PROVIDER_SITE_OTHER)
Admission: RE | Admit: 2019-07-02 | Discharge: 2019-07-02 | Disposition: A | Discharge status: Other Acute Inpatient Hospital | Payer: BC Managed Care – PPO | Source: Ambulatory Visit

## 2019-07-02 ENCOUNTER — Ambulatory Visit
Admission: EM | Admit: 2019-07-02 | Discharge: 2019-07-02 | Disposition: A | Discharge status: Home / Self Care | Payer: BC Managed Care – PPO | Attending: Emergency Medicine | Admitting: Emergency Medicine

## 2019-07-02 DIAGNOSIS — R220 Localized swelling, mass and lump, head: Secondary | ICD-10-CM

## 2019-07-02 DIAGNOSIS — L03211 Cellulitis of face: Secondary | ICD-10-CM

## 2019-07-02 DIAGNOSIS — K047 Periapical abscess without sinus: Secondary | ICD-10-CM

## 2019-07-02 DIAGNOSIS — K0889 Other specified disorders of teeth and supporting structures: Secondary | ICD-10-CM

## 2019-07-02 MED ORDER — PREDNISONE 20 MG PO TABS: 20.0000 mg | ORAL_TABLET | Freq: Two times a day (BID) | ORAL | 0 refills | Status: AC

## 2019-07-02 MED ORDER — CEFDINIR 300 MG PO CAPS: 300.0000 mg | ORAL_CAPSULE | Freq: Two times a day (BID) | ORAL | 0 refills | Status: AC

## 2019-07-02 MED ORDER — DEXAMETHASONE SODIUM PHOSPHATE 10 MG/ML IJ SOLN
10.0000 mg | Freq: Once | INTRAMUSCULAR | Status: AC
  Administered 2019-07-02: 10 mg via INTRAMUSCULAR

## 2019-07-02 MED ORDER — CEFTRIAXONE SODIUM 1 G IJ SOLR
1.0000 g | Freq: Once | INTRAMUSCULAR | Status: AC
  Administered 2019-07-02: 14:00:00 1 g via INTRAMUSCULAR

## 2019-07-02 MED ORDER — TRAMADOL HCL 50 MG PO TABS: 50.0000 mg | ORAL_TABLET | Freq: Two times a day (BID) | ORAL | 0 refills | Status: DC | PRN

## 2019-07-02 MED ORDER — SULFAMETHOXAZOLE-TRIMETHOPRIM 800-160 MG PO TABS: 1.0000 | ORAL_TABLET | Freq: Two times a day (BID) | ORAL | 0 refills | Status: AC

## 2019-07-02 NOTE — Discharge Instructions (Addendum)
Decadron shot given in office Rocephin shot given in office Prednisone prescribed for swelling Cefdinir and bactrim prescribed for infection including possible facial abscess, dental infection and/or preseptal cellulitis Follow up with dentist as soon as possible for further evaluation and treatment  Otherwise follow up with PCP next week for recheck Return or go to the ED if you have any new or worsening symptoms such as fever, chills, difficulty swallowing, painful swallowing, oral or neck swelling, nausea, vomiting, chest pain, SOB, painful eye movements, worsening symptoms despite treatment, etc...  Tramadol for severe break-through pain.  DO NOT TAKE prior to driving or operating heavy machinery as this may you drowsy.  DO NOT TAKE WITH TRAZODONE

## 2019-07-02 NOTE — Discharge Instructions (Signed)
Come to the Urgent Care to be seen in person.

## 2019-07-02 NOTE — ED Triage Notes (Signed)
Pt has developed swollen left face after broken tooth from 2 years ago

## 2019-07-02 NOTE — ED Provider Notes (Signed)
[facility]   [id number] 07/02/19 Arrival Time: 1350  CC: RT sided facial swelling  SUBJECTIVE:  Meghan Hoover is a 59 y.o. female who presents with RT sided facial pain and swelling x 1 day.  Denies a precipitating event or trauma.  Reports having a broken tooth to RT upper jaw 2 years ago.  Localizes pain and swelling to RT side of face.  Has tried OTC analgesics without relief.  Worse with chewing.  Does not see a dentist regularly.  Denies similar symptoms in the past.  Denies fever, chills, dyspnea, dysphagia, odynophagia, nausea, vomiting, chest pain, SOB, painful EOMs.    Had video visit today.  Was recommended to come in person to be evaluated.    ROS: As per HPI.  All other pertinent ROS negative.     Past Medical History:  Diagnosis Date  . Anxiety   . Bilateral lower extremity edema    hx of 2014   . Bronchitis    hx of   . CHF (congestive heart failure) ([place])   . COPD (chronic obstructive pulmonary disease) ([place])   . Coronary artery disease   . Depression   . Dysphagia   . GERD (gastroesophageal reflux disease)   . Heart failure ([place])   . History of frequent urinary tract infections    last one 10 years ago   . Hypertension   . Hypothyroid   . Numbness    right hand since 2014  . Pneumonia    2014  . Shortness of breath dyspnea    with exertion   . Sleep apnea    uses CPAP  . Urinary incontinence    Past Surgical History:  Procedure Laterality Date  . ACHILLES TENDON SURGERY    . BACK SURGERY     x4  . CARDIAC CATHETERIZATION N/A 10/24/2015   Procedure: Right/Left Heart Cath and Coronary Angiography;  Surgeon: Jettie Booze, MD;  Location: [facility];  Service: Cardiovascular;  Laterality: N/A;  . COLONOSCOPY N/A 05/24/2013   Procedure: COLONOSCOPY;  Surgeon: Daneil Dolin, MD;  Location: [facility];  Service: Endoscopy;  Laterality: N/A;  3:00 PM  . GASTROSTOMY W/ FEEDING TUBE     hx of 2014   . LAPAROSCOPIC ROUX-EN-Y  GASTRIC BYPASS WITH UPPER ENDOSCOPY AND REMOVAL OF LAP BAND N/A 06/27/2014   Procedure: LAPAROSCOPIC ROUX-EN-Y GASTRIC BYPASS   WITH UPPER ENDOSCOPY;  Surgeon: Johnathan Hausen, MD;  Location: [facility];  Service: General;  Laterality: N/A;  . swallowing evaluation      hx of   . TONSILECTOMY, ADENOIDECTOMY, BILATERAL MYRINGOTOMY AND TUBES    . TONSILLECTOMY    . TRACHEOSTOMY     11/2012  . TRACHEOSTOMY CLOSURE     11/2012  . UPPER GI ENDOSCOPY  06/27/2014   Procedure: UPPER GI ENDOSCOPY;  Surgeon: Johnathan Hausen, MD;  Location: [facility];  Service: General;;   Allergies  Allergen Reactions  . Augmentin [Amoxicillin-Pot Clavulanate] Other (See Comments)    Has patient had a PCN reaction causing immediate rash, facial/tongue/throat swelling, SOB or lightheadedness with hypotension:NO Has patient had a PCN reaction causing severe rash involving mucus membranes or skin necrosis:NO Has patient had a PCN reaction that required hospitalization:NO  Has patient had a PCN reaction occurring within the last 10 years:NO If all of the above answers are "NO", then may proceed with Cephalosporin use.   Thrush/Upset stomach/diarrhea   No current facility-administered medications on file prior to encounter.  Current Outpatient Medications on File Prior to Encounter  Medication Sig Dispense Refill  . Acetaminophen (MAPAP ACETAMINOPHEN EXTRA STR) 167 MG/5ML LIQD Take 15 mLs by mouth every 6 (six) hours as needed (for pain.).    Marland Kitchen aspirin EC 81 MG tablet Take 81 mg by mouth daily.    . Biotin (BIOTIN MAXIMUM STRENGTH) 10 MG TABS Take 10 mg by mouth daily.     . Calcium Carb-Cholecalciferol (CALCIUM 600 + D PO) Take 1 tablet by mouth 3 (three) times daily.    . celecoxib (CELEBREX) 200 MG capsule Take 200 mg by mouth daily as needed (for arthritic pain.).   1  . cetirizine (ZYRTEC) 10 MG tablet Take 10 mg by mouth daily.    . Cholecalciferol (VITAMIN D3) 2000 UNITS TABS Take 2,000 Units by mouth every morning.      . clotrimazole-betamethasone (LOTRISONE) cream Apply 1 application topically daily as needed. For skin rashes.    . colchicine 0.6 MG tablet Take 1 tablet by mouth daily as needed.    . Cyanocobalamin (VITAMIN B-12 CR PO) Place 5,000 mcg under the tongue every morning.     . diltiazem (DILACOR XR) 180 MG 24 hr capsule Take 1 capsule (180 mg total) by mouth daily. (Patient taking differently: Take 180 mg by mouth at bedtime. ) 90 capsule 3  . escitalopram (LEXAPRO) 10 MG tablet Take 10 mg by mouth daily.  1  . esomeprazole (NEXIUM) 40 MG capsule Take 40 mg by mouth every morning.     . fluticasone (FLONASE) 50 MCG/ACT nasal spray PLACE 2 SPRAYS INTO BOTH NOSTRILS 2 (TWO) TIMES DAILY. 16 g 0  . HYDROcodone-acetaminophen (NORCO/VICODIN) 5-325 MG tablet Take 1 tablet by mouth every 6 (six) hours as needed for moderate pain.    . Hylan (SYNVISC IX) Inject 1 applicator into the articular space every 6 (six) months.    . levothyroxine (SYNTHROID, LEVOTHROID) 75 MCG tablet Take 75 mcg by mouth daily before breakfast.    . lidocaine (LIDODERM) 5 % Place 1-2 patches onto the skin daily.     . Liniments (BLUE-EMU SUPER STRENGTH) CREA Apply 1 application topically 3 (three) times daily as needed (for joint pain.).    Marland Kitchen Multiple Vitamins-Minerals (MULTIVITAMIN WITH MINERALS) tablet Take 1 tablet by mouth 2 (two) times daily.     . Olopatadine HCl (PATADAY) 0.2 % SOLN Apply 1 drop to eye daily.     Marland Kitchen PROAIR HFA 108 (90 Base) MCG/ACT inhaler INHALE 2 PUFFS INTO THE LUNGS EVERY 6 (SIX) HOURS AS NEEDED FOR WHEEZING OR SHORTNESS OF BREATH. 8.5 Inhaler 3  . Propylene Glycol-Glycerin (SOOTHE) 0.6-0.6 % SOLN Apply 1 drop to eye 3 (three) times daily.    . Simethicone (GAS-X EXTRA STRENGTH) 62.5 MG STRP Take 2-4 strips by mouth 4 (four) times daily as needed (for gas). **do not exceed 8 strips in 24 hrs.**    . SPIRIVA HANDIHALER 18 MCG inhalation capsule INHALE 1 CAPSULE VIA HANDIHALER ONCE DAILY AT THE SAME TIME  EVERY DAY 30 capsule 0  . torsemide (DEMADEX) 20 MG tablet TAKE 1 TABLET BY MOUTH TWICE A DAY 180 tablet 3  . traZODone (DESYREL) 50 MG tablet Take 50 mg by mouth at bedtime.   1   Social History   Socioeconomic History  . Marital status: Married    Spouse name: Not on file  . Number of children: Not on file  . Years of education: Not on file  . Highest education level:  Not on file  Occupational History  . Occupation: retired    Fish farm manager: RETIRED    Comment: Therapist, art  Tobacco Use  . Smoking status: Former Smoker    Packs/day: 3.00    Years: 40.00    Pack years: 120.00    Types: Cigarettes    Start date: 01/22/1971    Quit date: 11/19/2012    Years since quitting: 6.6  . Smokeless tobacco: Never Used  Substance and Sexual Activity  . Alcohol use: No    Alcohol/week: 0.0 standard drinks    Comment: social drank quit 5 years ago   . Drug use: No  . Sexual activity: Not on file  Other Topics Concern  . Not on file  Social History Narrative  . Not on file   Social Determinants of Health   Financial Resource Strain:   . Difficulty of Paying Living Expenses:   Food Insecurity:   . Worried About Charity fundraiser in the Last Year:   . Arboriculturist in the Last Year:   Transportation Needs:   . Film/video editor (Medical):   Marland Kitchen Lack of Transportation (Non-Medical):   Physical Activity:   . Days of Exercise per Week:   . Minutes of Exercise per Session:   Stress:   . Feeling of Stress :   Social Connections:   . Frequency of Communication with Friends and Family:   . Frequency of Social Gatherings with Friends and Family:   . Attends Religious Services:   . Active Member of Clubs or Organizations:   . Attends Archivist Meetings:   Marland Kitchen Marital Status:   Intimate Partner Violence:   . Fear of Current or Ex-Partner:   . Emotionally Abused:   Marland Kitchen Physically Abused:   . Sexually Abused:    Family History  Problem Relation Age of Onset  . Heart  disease Mother   . Emphysema Father   . Heart disease Father   . Cancer Father        lung  . Diabetes Sister   . Asthma Sister   . Colon cancer Neg Hx     OBJECTIVE:  Vitals:   07/02/19 1357  BP: (!) 179/93  Pulse: 82  Resp: 20  Temp: 98 F (36.7 C)  TempSrc: Tympanic  SpO2: 92%    General appearance: alert; no distress HENT: normocephalic, atraumatic; RT sided facial swelling, with overlying erythema including RT lower eyelid involvement, TTP over RT side of face; EOMI grossly without discomfort, no conjunctiva erythema, PERRL; dentition: fair to poor; dental caries with partial tooth avulsion over right upper gums without areas of fluctuance; tolerating oral secretions, oropharynx patent Neck: supple without LAD Lungs: normal respirations; CTAB CV: RRR Skin: warm and dry Psychological: alert and cooperative; normal mood and affect  ASSESSMENT & PLAN:  1. Facial swelling   2. Facial cellulitis   3. Dental infection   4. Pain, dental     Meds ordered this encounter  Medications  . sulfamethoxazole-trimethoprim (BACTRIM DS) 800-160 MG tablet    Sig: Take 1 tablet by mouth 2 (two) times daily for 10 days.    Dispense:  20 tablet    Refill:  0    Order Specific Question:   Supervising Provider    Answer:   Raylene Everts [[id number]]  . cefdinir (OMNICEF) 300 MG capsule    Sig: Take 1 capsule (300 mg total) by mouth 2 (two) times daily for 10 days.  Dispense:  20 capsule    Refill:  0    Order Specific Question:   Supervising Provider    Answer:   Raylene Everts [[id number]]  . predniSONE (DELTASONE) 20 MG tablet    Sig: Take 1 tablet (20 mg total) by mouth 2 (two) times daily with a meal for 5 days.    Dispense:  10 tablet    Refill:  0    Order Specific Question:   Supervising Provider    Answer:   Raylene Everts [[id number]]  . cefTRIAXone (ROCEPHIN) injection 1 g  . dexamethasone (DECADRON) injection 10 mg  . traMADol (ULTRAM) 50 MG tablet    Sig:  Take 1 tablet (50 mg total) by mouth every 12 (twelve) hours as needed for severe pain.    Dispense:  10 tablet    Refill:  0    Order Specific Question:   Supervising Provider    Answer:   Raylene Everts [[id number]]   Offered further evaluation and mangement in the ED given extent of facial swelling and pain.  Patient declines at this time.  Would like to try outpatient therapy first.    Decadron shot given in office Rocephin shot given in office Prednisone prescribed for swelling Cefdinir and bactrim prescribed for infection including possible facial abscess, dental infection and/or preseptal cellulitis Follow up with dentist as soon as possible for further evaluation and treatment  Otherwise follow up with PCP next week for recheck Return or go to the ED if you have any new or worsening symptoms such as fever, chills, difficulty swallowing, painful swallowing, oral or neck swelling, nausea, vomiting, chest pain, SOB, painful eye movements, worsening symptoms despite treatment, etc...  Reviewed expectations re: course of current medical issues. Questions answered. Outlined signs and symptoms indicating need for more acute intervention. Patient verbalized understanding. After Visit Summary given.   Lestine Box, PA-C 07/02/19 1430

## 2019-07-02 NOTE — ED Provider Notes (Signed)
Virtual Visit via Video Note:  Meghan Hoover  initiated request for Telemedicine visit with [facility] Urgent Care team. I connected with Meghan Hoover  on 07/02/2019 at 12:19 PM  for a synchronized telemedicine visit using a video enabled HIPPA compliant telemedicine application. I verified that I am speaking with Meghan Hoover  using two identifiers. Sharion Balloon, NP  was physically located in a [facility] Urgent care site and Meghan Hoover was located at a different location.   The limitations of evaluation and management by telemedicine as well as the availability of in-person appointments were discussed. Patient was informed that she  may incur a bill ( including co-pay) for this virtual visit encounter. Meghan Hoover  expressed understanding and gave verbal consent to proceed with virtual visit.     History of Present Illness:Meghan Hoover  is a 59 y.o. female presents for evaluation of swelling on the right side of her face x 1 day.  She thinks it may be dental related but is not sure.  She denies difficulty swallowing or breathing.     Allergies  Allergen Reactions  . Augmentin [Amoxicillin-Pot Clavulanate] Other (See Comments)    Has patient had a PCN reaction causing immediate rash, facial/tongue/throat swelling, SOB or lightheadedness with hypotension:NO Has patient had a PCN reaction causing severe rash involving mucus membranes or skin necrosis:NO Has patient had a PCN reaction that required hospitalization:NO  Has patient had a PCN reaction occurring within the last 10 years:NO If all of the above answers are "NO", then may proceed with Cephalosporin use.   Thrush/Upset stomach/diarrhea     Past Medical History:  Diagnosis Date  . Anxiety   . Bilateral lower extremity edema    hx of 2014   . Bronchitis    hx of   . CHF (congestive heart failure) ([place])   . COPD (chronic obstructive pulmonary disease) ([place])   . Coronary artery disease   . Depression   . Dysphagia    . GERD (gastroesophageal reflux disease)   . Heart failure ([place])   . History of frequent urinary tract infections    last one 10 years ago   . Hypertension   . Hypothyroid   . Numbness    right hand since 2014  . Pneumonia    2014  . Shortness of breath dyspnea    with exertion   . Sleep apnea    uses CPAP  . Urinary incontinence      Social History   Tobacco Use  . Smoking status: Former Smoker    Packs/day: 3.00    Years: 40.00    Pack years: 120.00    Types: Cigarettes    Start date: 01/22/1971    Quit date: 11/19/2012    Years since quitting: 6.6  . Smokeless tobacco: Never Used  Substance Use Topics  . Alcohol use: No    Alcohol/week: 0.0 standard drinks    Comment: social drank quit 5 years ago   . Drug use: No        Observations/Objective: Physical Exam  VITALS: Patient denies fever. GENERAL: Alert, appears well and in no acute distress. HEENT: Atraumatic. Right side facial swelling from eye to jaw.  No difficulty swallowing.  NECK: Normal movements of the head and neck. CARDIOPULMONARY: No increased WOB. Speaking in clear sentences. I:E ratio WNL.  MS: Moves all visible extremities without noticeable abnormality. PSYCH: Pleasant and cooperative, well-groomed. Speech normal rate and rhythm. Affect  is appropriate. Insight and judgement are appropriate. Attention is focused, linear, and appropriate.  NEURO: CN grossly intact. Oriented as arrived to appointment on time with no prompting. Moves both UE equally.  SKIN: No obvious lesions, wounds, erythema, or cyanosis noted on face or hands.   Assessment and Plan:    ICD-10-CM   1. Right facial swelling  R22.0        Follow Up Instructions: Instructed patient to come to the urgent care or go to the emergency department for evaluation of her facial swelling.  Patient readily agrees to plan of care.    I discussed the assessment and treatment plan with the patient. The patient was provided an  opportunity to ask questions and all were answered. The patient agreed with the plan and demonstrated an understanding of the instructions.   The patient was advised to call back or seek an in-person evaluation if the symptoms worsen or if the condition fails to improve as anticipated.      Sharion Balloon, NP  07/02/2019 12:19 PM         Sharion Balloon, NP 07/02/19 [phone]

## 2019-08-05 ENCOUNTER — Other Ambulatory Visit: Payer: Self-pay

## 2019-08-05 ENCOUNTER — Ambulatory Visit: Payer: BC Managed Care – PPO

## 2019-08-05 ENCOUNTER — Ambulatory Visit (INDEPENDENT_AMBULATORY_CARE_PROVIDER_SITE_OTHER): Payer: BC Managed Care – PPO | Admitting: Orthopaedic Surgery

## 2019-08-05 ENCOUNTER — Encounter: Payer: Self-pay | Admitting: Orthopaedic Surgery

## 2019-08-05 ENCOUNTER — Other Ambulatory Visit: Payer: Self-pay | Admitting: Orthopaedic Surgery

## 2019-08-05 VITALS — Ht 67.0 in | Wt >= 6400 oz

## 2019-08-05 DIAGNOSIS — M25562 Pain in left knee: Secondary | ICD-10-CM

## 2019-08-05 DIAGNOSIS — G8929 Other chronic pain: Secondary | ICD-10-CM | POA: Diagnosis not present

## 2019-08-05 DIAGNOSIS — M25561 Pain in right knee: Secondary | ICD-10-CM | POA: Diagnosis not present

## 2019-08-05 DIAGNOSIS — Z6841 Body Mass Index (BMI) 40.0 and over, adult: Secondary | ICD-10-CM

## 2019-08-05 NOTE — Progress Notes (Signed)
Subjective:    Patient ID: Meghan Hoover, female    DOB: Aug 02, 1960, 59 y.o.   MRN: [id number]  HPI She has long history of bilateral knee pain.  She has seen Dr. Noemi Chapel in the past. She has had Synvisc injections but the last several did not help.  She has no new trauma.  She has swelling, popping and medial knee pain. She knows her knees are "worn out".  She is a large lady.  She has had gastric bypass in the past.  It did not work.  She says she is constantly on a diet.  She cannot lose weight.   She has no new trauma.  She can walk only a short distance before having knee pain.  She knows she is not a good candidate for total knees secondary to her weight.  BMI is over 60.  Review of Systems  Constitutional: Positive for activity change.  Musculoskeletal: Positive for arthralgias, back pain, gait problem and joint swelling.   For Review of Systems, all other systems reviewed and are negative.  The following is a summary of the past history medically, past history surgically, known current medicines, social history and family history.  This information is gathered electronically by the computer from prior information and documentation.  I review this each visit and have found including this information at this point in the chart is beneficial and informative.   Past Medical History:  Diagnosis Date  . Anxiety   . Bilateral lower extremity edema    hx of 2014   . Bronchitis    hx of   . CHF (congestive heart failure) ([place])   . COPD (chronic obstructive pulmonary disease) ([place])   . Coronary artery disease   . Depression   . Dysphagia   . GERD (gastroesophageal reflux disease)   . Heart failure ([place])   . History of frequent urinary tract infections    last one 10 years ago   . Hypertension   . Hypothyroid   . Numbness    right hand since 2014  . Pneumonia    2014  . Shortness of breath dyspnea    with exertion   . Sleep apnea    uses CPAP  . Urinary incontinence      Past Surgical History:  Procedure Laterality Date  . ACHILLES TENDON SURGERY    . BACK SURGERY     x4  . CARDIAC CATHETERIZATION N/A 10/24/2015   Procedure: Right/Left Heart Cath and Coronary Angiography;  Surgeon: Jettie Booze, MD;  Location: [facility];  Service: Cardiovascular;  Laterality: N/A;  . COLONOSCOPY N/A 05/24/2013   Procedure: COLONOSCOPY;  Surgeon: Daneil Dolin, MD;  [facility];  Service: Endoscopy;  Laterality: N/A;  3:00 PM  . GASTROSTOMY W/ FEEDING TUBE     hx of 2014   . LAPAROSCOPIC ROUX-EN-Y GASTRIC BYPASS WITH UPPER ENDOSCOPY AND REMOVAL OF LAP BAND N/A 06/27/2014   Procedure: LAPAROSCOPIC ROUX-EN-Y GASTRIC BYPASS   WITH UPPER ENDOSCOPY;  Surgeon: Johnathan Hausen, MD;  [facility];  Service: General;  Laterality: N/A;  . swallowing evaluation      hx of   . TONSILECTOMY, ADENOIDECTOMY, BILATERAL MYRINGOTOMY AND TUBES    . TONSILLECTOMY    . TRACHEOSTOMY     11/2012  . TRACHEOSTOMY CLOSURE     11/2012  . UPPER GI ENDOSCOPY  06/27/2014   Procedure: UPPER GI ENDOSCOPY;  Surgeon: Johnathan Hausen, MD;  [facility]: [facility];  Service: General;;    Current Outpatient Medications on File Prior to Visit  Medication Sig Dispense Refill  . Acetaminophen (MAPAP ACETAMINOPHEN EXTRA STR) 167 MG/5ML LIQD Take 15 mLs by mouth every 6 (six) hours as needed (for pain.).    Marland Kitchen aspirin EC 81 MG tablet Take 81 mg by mouth daily.    . celecoxib (CELEBREX) 200 MG capsule Take 200 mg by mouth daily as needed (for arthritic pain.).   1  . cetirizine (ZYRTEC) 10 MG tablet Take 10 mg by mouth daily.    . Cholecalciferol (VITAMIN D3) 2000 UNITS TABS Take 2,000 Units by mouth every morning.     . clotrimazole-betamethasone (LOTRISONE) cream Apply 1 application topically daily as needed. For skin rashes.    . Cyanocobalamin (VITAMIN B-12 CR PO) Place 5,000 mcg under the tongue every morning.     . diltiazem (DILACOR XR) 180 MG 24 hr capsule Take 1 capsule (180 mg  total) by mouth daily. (Patient taking differently: Take 180 mg by mouth at bedtime. ) 90 capsule 3  . escitalopram (LEXAPRO) 10 MG tablet Take 10 mg by mouth daily.  1  . esomeprazole (NEXIUM) 40 MG capsule Take 40 mg by mouth every morning.     . fluticasone (FLONASE) 50 MCG/ACT nasal spray PLACE 2 SPRAYS INTO BOTH NOSTRILS 2 (TWO) TIMES DAILY. 16 g 0  . HYDROcodone-acetaminophen (NORCO/VICODIN) 5-325 MG tablet Take 1 tablet by mouth every 6 (six) hours as needed for moderate pain.    Marland Kitchen levothyroxine (SYNTHROID, LEVOTHROID) 75 MCG tablet Take 75 mcg by mouth daily before breakfast.    . Liniments (BLUE-EMU SUPER STRENGTH) CREA Apply 1 application topically 3 (three) times daily as needed (for joint pain.).    Marland Kitchen Multiple Vitamins-Minerals (MULTIVITAMIN WITH MINERALS) tablet Take 1 tablet by mouth 2 (two) times daily.     . Olopatadine HCl (PATADAY) 0.2 % SOLN Apply 1 drop to eye daily.     Marland Kitchen PROAIR HFA 108 (90 Base) MCG/ACT inhaler INHALE 2 PUFFS INTO THE LUNGS EVERY 6 (SIX) HOURS AS NEEDED FOR WHEEZING OR SHORTNESS OF BREATH. 8.5 Inhaler 3  . Propylene Glycol-Glycerin (SOOTHE) 0.6-0.6 % SOLN Apply 1 drop to eye 3 (three) times daily.    . Simethicone (GAS-X EXTRA STRENGTH) 62.5 MG STRP Take 2-4 strips by mouth 4 (four) times daily as needed (for gas). **do not exceed 8 strips in 24 hrs.**    . SPIRIVA HANDIHALER 18 MCG inhalation capsule INHALE 1 CAPSULE VIA HANDIHALER ONCE DAILY AT THE SAME TIME EVERY DAY 30 capsule 0  . torsemide (DEMADEX) 20 MG tablet TAKE 1 TABLET BY MOUTH TWICE A DAY 180 tablet 3  . traMADol (ULTRAM) 50 MG tablet Take 1 tablet (50 mg total) by mouth every 12 (twelve) hours as needed for severe pain. 10 tablet 0   No current facility-administered medications on file prior to visit.    Social History   Socioeconomic History  . Marital status: Married    Spouse name: Not on file  . Number of children: Not on file  . Years of education: Not on file  . Highest education  level: Not on file  Occupational History  . Occupation: retired    Fish farm manager: RETIRED    Comment: Therapist, art  Tobacco Use  . Smoking status: Former Smoker    Packs/day: 3.00    Years: 40.00    Pack years: 120.00    Types: Cigarettes    Start date: 01/22/1971    Quit  date: 11/19/2012    Years since quitting: 6.7  . Smokeless tobacco: Never Used  Substance and Sexual Activity  . Alcohol use: No    Alcohol/week: 0.0 standard drinks    Comment: social drank quit 5 years ago   . Drug use: No  . Sexual activity: Not on file  Other Topics Concern  . Not on file  Social History Narrative  . Not on file   Social Determinants of Health   Financial Resource Strain:   . Difficulty of Paying Living Expenses:   Food Insecurity:   . Worried About Charity fundraiser in the Last Year:   . Arboriculturist in the Last Year:   Transportation Needs:   . Film/video editor (Medical):   Marland Kitchen Lack of Transportation (Non-Medical):   Physical Activity:   . Days of Exercise per Week:   . Minutes of Exercise per Session:   Stress:   . Feeling of Stress :   Social Connections:   . Frequency of Communication with Friends and Family:   . Frequency of Social Gatherings with Friends and Family:   . Attends Religious Services:   . Active Member of Clubs or Organizations:   . Attends Archivist Meetings:   Marland Kitchen Marital Status:   Intimate Partner Violence:   . Fear of Current or Ex-Partner:   . Emotionally Abused:   Marland Kitchen Physically Abused:   . Sexually Abused:     Family History  Problem Relation Age of Onset  . Heart disease Mother   . Emphysema Father   . Heart disease Father   . Cancer Father        lung  . Diabetes Sister   . Asthma Sister   . Colon cancer Neg Hx     Ht 5\' 7"  (1.702 m)   Wt (!) 400 lb (181.4 kg)   BMI 62.65 kg/m   Body mass index is 62.65 kg/m.      Objective:   Physical Exam Vitals and nursing note reviewed.  Constitutional:      Appearance:  She is well-developed.  HENT:     Head: Normocephalic and atraumatic.  Eyes:     Conjunctiva/sclera: Conjunctivae normal.     Pupils: Pupils are equal, round, and reactive to light.  Cardiovascular:     Rate and Rhythm: Normal rate and regular rhythm.  Pulmonary:     Effort: Pulmonary effort is normal.  Abdominal:     Palpations: Abdomen is soft.  Musculoskeletal:     Cervical back: Normal range of motion and neck supple.       Legs:  Skin:    General: Skin is warm and dry.  Neurological:     Mental Status: She is alert and oriented to person, place, and time.     Cranial Nerves: No cranial nerve deficit.     Motor: No abnormal muscle tone.     Coordination: Coordination normal.     Deep Tendon Reflexes: Reflexes are normal and symmetric. Reflexes normal.  Psychiatric:        Behavior: Behavior normal.        Thought Content: Thought content normal.        Judgment: Judgment normal.    X-rays were done of both knees, reported separately.       Assessment & Plan:   Encounter Diagnoses  Name Primary?  . Chronic pain of right knee Yes  . Chronic pain of left knee   .  Body mass index 60.0-69.9, adult ([place])   . Morbid obesity ([place])    PROCEDURE NOTE:  The patient requests injections of the left knee , verbal consent was obtained.  The left knee was prepped appropriately after time out was performed.   Sterile technique was observed and injection of 1 cc of Depo-Medrol 40 mg with several cc's of plain xylocaine. Anesthesia was provided by ethyl chloride and a 20-gauge needle was used to inject the knee area. The injection was tolerated well.  A band aid dressing was applied.  The patient was advised to apply ice later today and tomorrow to the injection sight as needed.  PROCEDURE NOTE:  The patient requests injections of the right knee , verbal consent was obtained.  The right knee was prepped appropriately after time out was performed.   Sterile technique was  observed and injection of 1 cc of Depo-Medrol 40 mg with several cc's of plain xylocaine. Anesthesia was provided by ethyl chloride and a 20-gauge needle was used to inject the knee area. The injection was tolerated well.  A band aid dressing was applied.  The patient was advised to apply ice later today and tomorrow to the injection sight as needed.  I told her she should try to lose weight, even at 3 to 5 pounds a month.  I will see her in one month.  Consider injections then.  Call if any problem.  Precautions discussed.   Electronically Signed Sanjuana Kava, MD 7/15/202112:47 PM

## 2019-09-02 ENCOUNTER — Encounter: Payer: Self-pay | Admitting: Radiology

## 2019-09-02 ENCOUNTER — Encounter: Payer: Self-pay | Admitting: Orthopaedic Surgery

## 2019-09-02 ENCOUNTER — Other Ambulatory Visit: Payer: Self-pay

## 2019-09-02 ENCOUNTER — Ambulatory Visit (INDEPENDENT_AMBULATORY_CARE_PROVIDER_SITE_OTHER): Payer: BC Managed Care – PPO | Admitting: Orthopaedic Surgery

## 2019-09-02 VITALS — BP 150/90 | HR 80 | Ht 67.0 in | Wt >= 6400 oz

## 2019-09-02 DIAGNOSIS — M25561 Pain in right knee: Secondary | ICD-10-CM

## 2019-09-02 DIAGNOSIS — Z6841 Body Mass Index (BMI) 40.0 and over, adult: Secondary | ICD-10-CM | POA: Diagnosis not present

## 2019-09-02 DIAGNOSIS — G8929 Other chronic pain: Secondary | ICD-10-CM

## 2019-09-02 DIAGNOSIS — M25562 Pain in left knee: Secondary | ICD-10-CM

## 2019-09-02 NOTE — Progress Notes (Signed)
Patient was in the office and requesting Synvisc series of 3 for Dr. Luna Glasgow. Patient reports that she had this in the past and it worked well.   Submitted online to [employer] for BV.  This is [place], not sure about prior auth/if needed.  Will followup.   Patient has next three appts scheduled for these injections already.

## 2019-09-02 NOTE — Progress Notes (Signed)
Patient Meghan Hoover, female DOB:Jul 13, 1960, 59 y.o. TDH:[id number]  Chief Complaint  Patient presents with  . Knee Pain    Bilat/ hurting/injections helped some    HPI  Meghan Hoover is a 59 y.o. female who has pain in both knees.  She has severe DJD of both knees.  The cortisone injections lasted only a short time for relief.  She has had Synvisc in the past and would like to try that again.  She is overweight and has other medical problems that limit any total joint surgery.  Her mother is to be seen here next week to see Dr. Aline Brochure.  She asked to be seen then too to begin the Synvisc injections as she has to bring her mother in.  I will ask staff if Dr. Aline Brochure can give the weekly injections.   Body mass index is 62.65 kg/m.  The patient meets the AMA guidelines for Morbid (severe) obesity with a BMI > 40.0 and I have recommended weight loss.   ROS  Review of Systems  Constitutional: Positive for activity change.  Musculoskeletal: Positive for arthralgias, back pain, gait problem and joint swelling.    All other systems reviewed and are negative.  The following is a summary of the past history medically, past history surgically, known current medicines, social history and family history.  This information is gathered electronically by the computer from prior information and documentation.  I review this each visit and have found including this information at this point in the chart is beneficial and informative.    Past Medical History:  Diagnosis Date  . Anxiety   . Bilateral lower extremity edema    hx of 2014   . Bronchitis    hx of   . CHF (congestive heart failure) ([place])   . COPD (chronic obstructive pulmonary disease) ([place])   . Coronary artery disease   . Depression   . Dysphagia   . GERD (gastroesophageal reflux disease)   . Heart failure ([place])   . History of frequent urinary tract infections    last one 10 years ago   . Hypertension   . Hypothyroid    . Numbness    right hand since 2014  . Pneumonia    2014  . Shortness of breath dyspnea    with exertion   . Sleep apnea    uses CPAP  . Urinary incontinence     Past Surgical History:  Procedure Laterality Date  . ACHILLES TENDON SURGERY    . BACK SURGERY     x4  . CARDIAC CATHETERIZATION N/A 10/24/2015   Procedure: Right/Left Heart Cath and Coronary Angiography;  Surgeon: Jettie Booze, MD;  Location: [facility];  Service: Cardiovascular;  Laterality: N/A;  . COLONOSCOPY N/A 05/24/2013   Procedure: COLONOSCOPY;  Surgeon: Daneil Dolin, MD;  [facility];  Service: Endoscopy;  Laterality: N/A;  3:00 PM  . GASTROSTOMY W/ FEEDING TUBE     hx of 2014   . LAPAROSCOPIC ROUX-EN-Y GASTRIC BYPASS WITH UPPER ENDOSCOPY AND REMOVAL OF LAP BAND N/A 06/27/2014   Procedure: LAPAROSCOPIC ROUX-EN-Y GASTRIC BYPASS   WITH UPPER ENDOSCOPY;  Surgeon: Johnathan Hausen, MD;  [facility];  Service: General;  Laterality: N/A;  . swallowing evaluation      hx of   . TONSILECTOMY, ADENOIDECTOMY, BILATERAL MYRINGOTOMY AND TUBES    . TONSILLECTOMY    . TRACHEOSTOMY     11/2012  . TRACHEOSTOMY CLOSURE  11/2012  . UPPER GI ENDOSCOPY  06/27/2014   Procedure: UPPER GI ENDOSCOPY;  Surgeon: Johnathan Hausen, MD;  [facility];  Service: General;;    Family History  Problem Relation Age of Onset  . Heart disease Mother   . Emphysema Father   . Heart disease Father   . Cancer Father        lung  . Diabetes Sister   . Asthma Sister   . Colon cancer Neg Hx     Social History Social History   Tobacco Use  . Smoking status: Former Smoker    Packs/day: 3.00    Years: 40.00    Pack years: 120.00    Types: Cigarettes    Start date: 01/22/1971    Quit date: 11/19/2012    Years since quitting: 6.7  . Smokeless tobacco: Never Used  Substance Use Topics  . Alcohol use: No    Alcohol/week: 0.0 standard drinks    Comment: social drank quit 5 years ago   . Drug use: No     Allergies  Allergen Reactions  . Augmentin [Amoxicillin-Pot Clavulanate] Other (See Comments)    Has patient had a PCN reaction causing immediate rash, facial/tongue/throat swelling, SOB or lightheadedness with hypotension:NO Has patient had a PCN reaction causing severe rash involving mucus membranes or skin necrosis:NO Has patient had a PCN reaction that required hospitalization:NO  Has patient had a PCN reaction occurring within the last 10 years:NO If all of the above answers are "NO", then may proceed with Cephalosporin use.   Thrush/Upset stomach/diarrhea    Current Outpatient Medications  Medication Sig Dispense Refill  . Acetaminophen (MAPAP ACETAMINOPHEN EXTRA STR) 167 MG/5ML LIQD Take 15 mLs by mouth every 6 (six) hours as needed (for pain.).    Marland Kitchen aspirin EC 81 MG tablet Take 81 mg by mouth daily.    . celecoxib (CELEBREX) 200 MG capsule Take 200 mg by mouth daily as needed (for arthritic pain.).   1  . cetirizine (ZYRTEC) 10 MG tablet Take 10 mg by mouth daily.    . Cholecalciferol (VITAMIN D3) 2000 UNITS TABS Take 2,000 Units by mouth every morning.     . clotrimazole-betamethasone (LOTRISONE) cream Apply 1 application topically daily as needed. For skin rashes.    . Cyanocobalamin (VITAMIN B-12 CR PO) Place 5,000 mcg under the tongue every morning.     . diltiazem (DILACOR XR) 180 MG 24 hr capsule Take 1 capsule (180 mg total) by mouth daily. (Patient taking differently: Take 180 mg by mouth at bedtime. ) 90 capsule 3  . escitalopram (LEXAPRO) 10 MG tablet Take 10 mg by mouth daily.  1  . esomeprazole (NEXIUM) 40 MG capsule Take 40 mg by mouth every morning.     . fluticasone (FLONASE) 50 MCG/ACT nasal spray PLACE 2 SPRAYS INTO BOTH NOSTRILS 2 (TWO) TIMES DAILY. 16 g 0  . HYDROcodone-acetaminophen (NORCO/VICODIN) 5-325 MG tablet Take 1 tablet by mouth every 6 (six) hours as needed for moderate pain.    Marland Kitchen levothyroxine (SYNTHROID, LEVOTHROID) 75 MCG tablet Take 75 mcg by  mouth daily before breakfast.    . Liniments (BLUE-EMU SUPER STRENGTH) CREA Apply 1 application topically 3 (three) times daily as needed (for joint pain.).    Marland Kitchen Multiple Vitamins-Minerals (MULTIVITAMIN WITH MINERALS) tablet Take 1 tablet by mouth 2 (two) times daily.     . Olopatadine HCl (PATADAY) 0.2 % SOLN Apply 1 drop to eye daily.     Marland Kitchen PROAIR HFA 108 (  90 Base) MCG/ACT inhaler INHALE 2 PUFFS INTO THE LUNGS EVERY 6 (SIX) HOURS AS NEEDED FOR WHEEZING OR SHORTNESS OF BREATH. 8.5 Inhaler 3  . Propylene Glycol-Glycerin (SOOTHE) 0.6-0.6 % SOLN Apply 1 drop to eye 3 (three) times daily.    . Simethicone (GAS-X EXTRA STRENGTH) 62.5 MG STRP Take 2-4 strips by mouth 4 (four) times daily as needed (for gas). **do not exceed 8 strips in 24 hrs.**    . SPIRIVA HANDIHALER 18 MCG inhalation capsule INHALE 1 CAPSULE VIA HANDIHALER ONCE DAILY AT THE SAME TIME EVERY DAY 30 capsule 0  . torsemide (DEMADEX) 20 MG tablet TAKE 1 TABLET BY MOUTH TWICE A DAY 180 tablet 3  . traMADol (ULTRAM) 50 MG tablet Take 1 tablet (50 mg total) by mouth every 12 (twelve) hours as needed for severe pain. 10 tablet 0   No current facility-administered medications for this visit.     Physical Exam  Blood pressure (!) 150/90, pulse 80, height 5\' 7"  (1.702 m), weight (!) 400 lb (181.4 kg).  Constitutional: overall normal hygiene, normal nutrition, well developed, normal grooming, normal body habitus. Assistive device:none  Musculoskeletal: gait and station Limp mor to the right, muscle tone and strength are normal, no tremors or atrophy is present.  .  Neurological: coordination overall normal.  Deep tendon reflex/nerve stretch intact.  Sensation normal.  Cranial nerves II-XII intact.   Skin:   Normal overall no scars, lesions, ulcers or rashes. No psoriasis.  Psychiatric: Alert and oriented x 3.  Recent memory intact, remote memory unclear.  Normal mood and affect. Well groomed.  Good eye contact.  Cardiovascular:  overall no swelling, no varicosities, no edema bilaterally, normal temperatures of the legs and arms, no clubbing, cyanosis and good capillary refill.  Lymphatic: palpation is normal.  Both knees have effusion, right more tender than left, both have crepitus, ROM right 0 to 100, left 0 to 110, limp right.  All other systems reviewed and are negative   The patient has been educated about the nature of the problem(s) and counseled on treatment options.  The patient appeared to understand what I have discussed and is in agreement with it.  Encounter Diagnoses  Name Primary?  . Chronic pain of right knee Yes  . Chronic pain of left knee   . Body mass index 60.0-69.9, adult ([place])   . Morbid obesity ([place])     PLAN Call if any problems.  Precautions discussed.  Continue current medications.   Return to clinic 1 week   See Dr. Aline Brochure next week for Synvisc injections.  Electronically Signed Sanjuana Kava, MD 8/12/202111:24 AM

## 2019-09-03 NOTE — Progress Notes (Signed)
BV pending. Per online-  [place] @[phone], Unable to obtain benefits, as per IVR their is a technical issue, we will try calling later as our target time is within next business day. UM*O

## 2019-09-06 NOTE — Progress Notes (Signed)
Still no BV, I sent message to check status on the portal.

## 2019-09-08 NOTE — Progress Notes (Signed)
Thanks, I did confirm that late yesterday as well.  Her insurance covers it at 90%.  I am going to the [facility] office today to get a few syringes so we can start her injections tomorrow as scheduled.  Will you please call her and tell her all this?  We can see her tomorrow and initiate injection series as planned.  Thanks.

## 2019-09-08 NOTE — Progress Notes (Signed)
I left a brief message for the patient per her request and let her know that we could start the series of the injections. She was asked to call back with any questions.

## 2019-09-09 ENCOUNTER — Encounter: Payer: Self-pay | Admitting: Orthopaedic Surgery

## 2019-09-09 ENCOUNTER — Other Ambulatory Visit: Payer: Self-pay

## 2019-09-09 ENCOUNTER — Ambulatory Visit (INDEPENDENT_AMBULATORY_CARE_PROVIDER_SITE_OTHER): Payer: BC Managed Care – PPO | Admitting: Orthopaedic Surgery

## 2019-09-09 VITALS — Ht 67.0 in | Wt >= 6400 oz

## 2019-09-09 DIAGNOSIS — Z6841 Body Mass Index (BMI) 40.0 and over, adult: Secondary | ICD-10-CM

## 2019-09-09 DIAGNOSIS — M1712 Unilateral primary osteoarthritis, left knee: Secondary | ICD-10-CM | POA: Diagnosis not present

## 2019-09-09 DIAGNOSIS — M1711 Unilateral primary osteoarthritis, right knee: Secondary | ICD-10-CM

## 2019-09-09 DIAGNOSIS — G8929 Other chronic pain: Secondary | ICD-10-CM

## 2019-09-09 NOTE — Progress Notes (Signed)
This patient is diagnosed with osteoarthritis of the knee(s).    Radiographs show evidence of joint space narrowing, osteophytes, subchondral sclerosis and/or subchondral cysts.  This patient has knee pain which interferes with functional and activities of daily living.    This patient has experienced inadequate response, adverse effects and/or intolerance with conservative treatments such as acetaminophen, NSAIDS, topical creams, physical therapy or regular exercise, knee bracing and/or weight loss.   This patient has experienced inadequate response or has a contraindication to intra articular steroid injections for at least 3 months.   This patient is not scheduled to have a total knee replacement within 6 months of starting treatment with viscosupplementation.  PROCEDURE NOTE:  Synvisc injection #  1 of 3   Injection 1 vial of Synvisc into right  knee  Ht 5\' 7"  (1.702 m)   Wt (!) 403 lb (182.8 kg)   BMI 63.12 kg/m   The right knee exam: there was no synovitis or infection   The knee was prepped sterilely  Ethyl chloride was used to anesthetize the skin A 20 g needle was used to inject the knee with 1 vial of Synvisc A sterile dressing was placed  There were no complications  Encounter Diagnoses  Name Primary?  . Chronic pain of right knee Yes  . Chronic pain of left knee   . Body mass index 60.0-69.9, adult ([place])   . Morbid obesity ([place])     Follow up one week  PROCEDURE NOTE:  Synvisc injection #  1 of 3   Injection 1 vial of Synvisc into left  knee  Ht 5\' 7"  (1.702 m)   Wt (!) 403 lb (182.8 kg)   BMI 63.12 kg/m   The left knee exam: there was no synovitis or infection   The knee was prepped sterilely  Ethyl chloride was used to anesthetize the skin A 20 g needle was used to inject the knee with 1 vial of Synvisc A sterile dressing was placed  There were no complications  Encounter Diagnoses  Name Primary?  . Chronic pain of right knee Yes  . Chronic  pain of left knee   . Body mass index 60.0-69.9, adult ([place])   . Morbid obesity ([place])     Follow up one week  Call if any problem.  Precautions discussed.   Electronically Signed Sanjuana Kava, MD 8/19/20219:55 AM

## 2019-09-09 NOTE — Progress Notes (Signed)
Patient came in the office today and received her injections. She did not have any questions.

## 2019-09-15 ENCOUNTER — Ambulatory Visit (INDEPENDENT_AMBULATORY_CARE_PROVIDER_SITE_OTHER): Payer: BC Managed Care – PPO | Admitting: Orthopedic Surgery

## 2019-09-15 ENCOUNTER — Other Ambulatory Visit: Payer: Self-pay

## 2019-09-15 DIAGNOSIS — G8929 Other chronic pain: Secondary | ICD-10-CM

## 2019-09-15 DIAGNOSIS — Z6841 Body Mass Index (BMI) 40.0 and over, adult: Secondary | ICD-10-CM

## 2019-09-15 DIAGNOSIS — M1712 Unilateral primary osteoarthritis, left knee: Secondary | ICD-10-CM

## 2019-09-15 DIAGNOSIS — M1711 Unilateral primary osteoarthritis, right knee: Secondary | ICD-10-CM

## 2019-09-15 NOTE — Patient Instructions (Signed)
DR Luna Glasgow 1 week

## 2019-09-15 NOTE — Progress Notes (Signed)
Chief Complaint  Patient presents with  . Knee Pain    bilateral knee pain, worse,    2/3 knee inj hyaluronic acid   The patient meets the AMA guidelines for Morbid (severe) obesity with a BMI > 40.0 and I have recommended weight loss.  Procedure note for injection of hyaluronic acid   Diagnosis osteoarthritis of the knee  Verbal consent was obtained to inject the knee with HYALURONIC ACID . Timeout was completed to confirm the injection site as the right    Knee  Ethyl chloride spray was used for anesthesia Alcohol was used to prep the skin. The infrapatellar lateral portal was used as an injection site and 1 vial of hyaluronic acid  was injected into the knee  Specific Co. Preparation: synvisc  No complications were noted  Procedure note for injection of hyaluronic acid   Diagnosis osteoarthritis of the knee  Verbal consent was obtained to inject the knee with HYALURONIC ACID . Timeout was completed to confirm the injection site as the left    Knee  Ethyl chloride spray was used for anesthesia Alcohol was used to prep the skin. The infrapatellar lateral portal was used as an injection site and 1 vial of hyaluronic acid  was injected into the knee  Specific Co. Preparation: synvisc  No complications were noted  Encounter Diagnoses  Name Primary?  . Chronic pain of right knee Yes  . Chronic pain of left knee   . Body mass index 60.0-69.9, adult ([place])   . Morbid obesity ([place])

## 2019-09-23 ENCOUNTER — Encounter: Payer: Self-pay | Admitting: Orthopaedic Surgery

## 2019-09-23 ENCOUNTER — Ambulatory Visit (INDEPENDENT_AMBULATORY_CARE_PROVIDER_SITE_OTHER): Payer: BC Managed Care – PPO | Admitting: Orthopaedic Surgery

## 2019-09-23 ENCOUNTER — Other Ambulatory Visit: Payer: Self-pay

## 2019-09-23 DIAGNOSIS — Z6841 Body Mass Index (BMI) 40.0 and over, adult: Secondary | ICD-10-CM

## 2019-09-23 DIAGNOSIS — M1712 Unilateral primary osteoarthritis, left knee: Secondary | ICD-10-CM

## 2019-09-23 DIAGNOSIS — M1711 Unilateral primary osteoarthritis, right knee: Secondary | ICD-10-CM | POA: Diagnosis not present

## 2019-09-23 DIAGNOSIS — G8929 Other chronic pain: Secondary | ICD-10-CM

## 2019-09-23 DIAGNOSIS — M25562 Pain in left knee: Secondary | ICD-10-CM

## 2019-09-23 NOTE — Progress Notes (Signed)
This patient is diagnosed with osteoarthritis of the knee(s).    Radiographs show evidence of joint space narrowing, osteophytes, subchondral sclerosis and/or subchondral cysts.  This patient has knee pain which interferes with functional and activities of daily living.    This patient has experienced inadequate response, adverse effects and/or intolerance with conservative treatments such as acetaminophen, NSAIDS, topical creams, physical therapy or regular exercise, knee bracing and/or weight loss.   This patient has experienced inadequate response or has a contraindication to intra articular steroid injections for at least 3 months.   This patient is not scheduled to have a total knee replacement within 6 months of starting treatment with viscosupplementation.  PROCEDURE NOTE:  Synvsic injection #  3 of 3   Injection 1 vial of Synvisc into left  knee  There were no vitals taken for this visit.  The left knee exam: there was no synovitis or infection   The knee was prepped sterilely  Ethyl chloride was used to anesthetize the skin A 20 g needle was used to inject the knee with 1 vial of Synvisc A sterile dressing was placed  There were no complications  Encounter Diagnoses  Name Primary?  . Chronic pain of right knee Yes  . Chronic pain of left knee   . Body mass index 60.0-69.9, adult ([place])   . Morbid obesity ([place])     Follow up one month  PROCEDURE NOTE:  Synvsic injection #  3 of 3   Injection 1 vial of Synvisc into right  knee  There were no vitals taken for this visit.  The right knee exam: there was no synovitis or infection   The knee was prepped sterilely  Ethyl chloride was used to anesthetize the skin A 20 g needle was used to inject the knee with 1 vial of Synvisc A sterile dressing was placed  There were no complications  Encounter Diagnoses  Name Primary?  . Chronic pain of right knee Yes  . Chronic pain of left knee   . Body mass index  60.0-69.9, adult ([place])   . Morbid obesity ([place])     Follow up one month  Electronically Signed Sanjuana Kava, MD 9/2/202110:15 AM

## 2019-10-21 ENCOUNTER — Encounter: Payer: Self-pay | Admitting: Orthopaedic Surgery

## 2019-10-21 ENCOUNTER — Other Ambulatory Visit: Payer: Self-pay

## 2019-10-21 ENCOUNTER — Ambulatory Visit (INDEPENDENT_AMBULATORY_CARE_PROVIDER_SITE_OTHER): Payer: BC Managed Care – PPO | Admitting: Orthopaedic Surgery

## 2019-10-21 VITALS — Ht 67.0 in | Wt >= 6400 oz

## 2019-10-21 DIAGNOSIS — M25561 Pain in right knee: Secondary | ICD-10-CM | POA: Diagnosis not present

## 2019-10-21 DIAGNOSIS — M25562 Pain in left knee: Secondary | ICD-10-CM | POA: Diagnosis not present

## 2019-10-21 DIAGNOSIS — Z6841 Body Mass Index (BMI) 40.0 and over, adult: Secondary | ICD-10-CM | POA: Diagnosis not present

## 2019-10-21 DIAGNOSIS — G8929 Other chronic pain: Secondary | ICD-10-CM

## 2019-10-21 NOTE — Progress Notes (Signed)
I am much better  She has responded well to the viscosupplementation of both knees.  She is walking better.  She has some pain but nothing like before.  NV intact.  Encounter Diagnoses  Name Primary?  . Chronic pain of right knee Yes  . Chronic pain of left knee   . Body mass index 60.0-69.9, adult ([place])   . Morbid obesity ([place])    I will see her as needed.  Call if any problem.  Precautions discussed.   Electronically Signed Sanjuana Kava, MD 9/30/20219:43 AM

## 2019-12-28 ENCOUNTER — Ambulatory Visit: Payer: BC Managed Care – PPO | Admitting: Orthopaedic Surgery

## 2020-11-29 ENCOUNTER — Telehealth: Payer: BC Managed Care – PPO | Admitting: Physician Assistant

## 2020-11-29 DIAGNOSIS — K047 Periapical abscess without sinus: Secondary | ICD-10-CM

## 2020-11-29 DIAGNOSIS — L03211 Cellulitis of face: Secondary | ICD-10-CM

## 2020-11-29 MED ORDER — CLINDAMYCIN HCL 300 MG PO CAPS: 300.0000 mg | ORAL_CAPSULE | Freq: Three times a day (TID) | ORAL | 0 refills | Status: DC

## 2020-11-29 NOTE — Patient Instructions (Signed)
Meghan Hoover, thank you for joining Leeanne Rio, PA-C for today's virtual visit.  While this provider is not your primary care provider (PCP), if your PCP is located in our provider database this encounter information will be shared with them immediately following your visit.  Consent: (Patient) Meghan Hoover provided verbal consent for this virtual visit at the beginning of the encounter.  Current Medications:  Current Outpatient Medications:    Acetaminophen (MAPAP ACETAMINOPHEN EXTRA STR) 167 MG/5ML LIQD, Take 15 mLs by mouth every 6 (six) hours as needed (for pain.)., Disp: , Rfl:    aspirin EC 81 MG tablet, Take 81 mg by mouth daily., Disp: , Rfl:    celecoxib (CELEBREX) 200 MG capsule, Take 200 mg by mouth daily as needed (for arthritic pain.). , Disp: , Rfl: 1   cetirizine (ZYRTEC) 10 MG tablet, Take 10 mg by mouth daily., Disp: , Rfl:    Cholecalciferol (VITAMIN D3) 2000 UNITS TABS, Take 2,000 Units by mouth every morning. , Disp: , Rfl:    clotrimazole-betamethasone (LOTRISONE) cream, Apply 1 application topically daily as needed. For skin rashes., Disp: , Rfl:    Cyanocobalamin (VITAMIN B-12 CR PO), Place 5,000 mcg under the tongue every morning. , Disp: , Rfl:    diltiazem (DILACOR XR) 180 MG 24 hr capsule, Take 1 capsule (180 mg total) by mouth daily. (Patient taking differently: Take 180 mg by mouth at bedtime. ), Disp: 90 capsule, Rfl: 3   escitalopram (LEXAPRO) 10 MG tablet, Take 10 mg by mouth daily., Disp: , Rfl: 1   esomeprazole (NEXIUM) 40 MG capsule, Take 40 mg by mouth every morning. , Disp: , Rfl:    fluticasone (FLONASE) 50 MCG/ACT nasal spray, PLACE 2 SPRAYS INTO BOTH NOSTRILS 2 (TWO) TIMES DAILY., Disp: 16 g, Rfl: 0   HYDROcodone-acetaminophen (NORCO/VICODIN) 5-325 MG tablet, Take 1 tablet by mouth every 6 (six) hours as needed for moderate pain., Disp: , Rfl:    levothyroxine (SYNTHROID, LEVOTHROID) 75 MCG tablet, Take 75 mcg by mouth daily before  breakfast., Disp: , Rfl:    Liniments (BLUE-EMU SUPER STRENGTH) CREA, Apply 1 application topically 3 (three) times daily as needed (for joint pain.)., Disp: , Rfl:    Multiple Vitamins-Minerals (MULTIVITAMIN WITH MINERALS) tablet, Take 1 tablet by mouth 2 (two) times daily. , Disp: , Rfl:    Olopatadine HCl (PATADAY) 0.2 % SOLN, Apply 1 drop to eye daily. , Disp: , Rfl:    PROAIR HFA 108 (90 Base) MCG/ACT inhaler, INHALE 2 PUFFS INTO THE LUNGS EVERY 6 (SIX) HOURS AS NEEDED FOR WHEEZING OR SHORTNESS OF BREATH., Disp: 8.5 Inhaler, Rfl: 3   Propylene Glycol-Glycerin (SOOTHE) 0.6-0.6 % SOLN, Apply 1 drop to eye 3 (three) times daily., Disp: , Rfl:    Simethicone (GAS-X EXTRA STRENGTH) 62.5 MG STRP, Take 2-4 strips by mouth 4 (four) times daily as needed (for gas). **do not exceed 8 strips in 24 hrs.**, Disp: , Rfl:    SPIRIVA HANDIHALER 18 MCG inhalation capsule, INHALE 1 CAPSULE VIA HANDIHALER ONCE DAILY AT THE SAME TIME EVERY DAY, Disp: 30 capsule, Rfl: 0   torsemide (DEMADEX) 20 MG tablet, TAKE 1 TABLET BY MOUTH TWICE A DAY, Disp: 180 tablet, Rfl: 3   Medications ordered in this encounter:  No orders of the defined types were placed in this encounter.    *If you need refills on other medications prior to your next appointment, please contact your pharmacy*  Follow-Up: Call back or seek an  in-person evaluation if the symptoms worsen or if the condition fails to improve as anticipated.  Other Instructions Please keep hydrated. Take the antibiotic as directed with food. Start a daily probiotic. You can continue use of Tylenol if needed for pain. If not improving within 36-48 hours you need an in-person evaluation at Urgent Care or with your PCP. Anything worsens or new onset fever -- ER immediately as discussed.    If you have been instructed to have an in-person evaluation today at a local Urgent Care facility, please use the link below. It will take you to a list of all of our available  [facility], including address, phone number and hours of operation. Please do not delay care.  [facility]  If you or a family member do not have a primary care provider, use the link below to schedule a visit and establish care. When you choose a [facility] primary care physician or advanced practice provider, you gain a long-term partner in health. Find a Primary Care Provider  Learn more about [facility] in-office and virtual care options: [facility]

## 2020-11-29 NOTE — Progress Notes (Signed)
Virtual Visit Consent   Meghan Hoover, you are scheduled for a virtual visit with a [facility] provider today.     Just as with appointments in the office, your consent must be obtained to participate.  Your consent will be active for this visit and any virtual visit you may have with one of our providers in the next 365 days.     If you have a MyChart account, a copy of this consent can be sent to you electronically.  All virtual visits are billed to your insurance company just like a traditional visit in the office.    As this is a virtual visit, video technology does not allow for your provider to perform a traditional examination.  This may limit your provider's ability to fully assess your condition.  If your provider identifies any concerns that need to be evaluated in person or the need to arrange testing (such as labs, EKG, etc.), we will make arrangements to do so.     Although advances in technology are sophisticated, we cannot ensure that it will always work on either your end or our end.  If the connection with a video visit is poor, the visit may have to be switched to a telephone visit.  With either a video or telephone visit, we are not always able to ensure that we have a secure connection.     I need to obtain your verbal consent now.   Are you willing to proceed with your visit today?    Meghan Hoover has provided verbal consent on 11/29/2020 for a virtual visit (video or telephone).   Leeanne Rio, [place]   Date: 11/29/2020 2:52 PM   Virtual Visit via Video Note   I, Leeanne Rio, connected with  Meghan Hoover  ([id number], 01-Dec-1960) on 11/29/20 at  2:45 PM EST by a video-enabled telemedicine application and verified that I am speaking with the correct person using two identifiers.  Location: Patient: Virtual Visit Location Patient: Home Provider: Virtual Visit Location Provider: Home Office   I discussed the limitations of evaluation and management by  telemedicine and the availability of in person appointments. The patient expressed understanding and agreed to proceed.    History of Present Illness: Meghan Hoover is a 60 y.o. who identifies as a female who was assigned female at birth, and is being seen today for R upper tooth pain x 3 days ago. Now with some facial swelling around the area. Denies fevers, chills. Denies vision change. Normal ROM of eyes. Has had prior issue with nearby teeth that have been removed. Has 2 that are still bad and needing removed. Had similar issue with dental infection and facial swelling last year where she was evaluated and started on antibiotics. Did not want things to get worse so wanted to be evaluated today. .  Is currently without a dentist as her previous provider is several towns away.   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   Morbid obesity with BMI of 60.0-69.9, adult ([place]) 06/27/2014   Morbid obesity ([place]) 08/24/2013   Encounter for screening colonoscopy 05/20/2013   COPD (chronic obstructive pulmonary disease) ([place]) 04/30/2013   Allergic rhinitis 04/30/2013   CAP (community acquired pneumonia) 59/74/1638   Acute diastolic congestive heart failure ([place]) 11/20/2012  Acute respiratory failure with hypoxia ([facility]) 11/19/2012   Volume overload 11/19/2012   Bilateral lower extremity edema 11/19/2012   HTN (hypertension) 11/19/2012   Normocytic anemia 11/19/2012   Tobacco dependence 11/19/2012   ACHILLES TENDINITIS 03/23/2008    Allergies:  Allergies  Allergen Reactions   Augmentin [Amoxicillin-Pot Clavulanate] Other (See Comments)    Has patient had a PCN reaction causing immediate rash, facial/tongue/throat swelling, SOB or lightheadedness with hypotension:NO Has patient had a PCN reaction causing severe rash involving mucus membranes or skin  necrosis:NO Has patient had a PCN reaction that required hospitalization:NO  Has patient had a PCN reaction occurring within the last 10 years:NO If all of the above answers are "NO", then may proceed with Cephalosporin use.   Thrush/Upset stomach/diarrhea   Medications:  Current Outpatient Medications:    clindamycin (CLEOCIN) 300 MG capsule, Take 1 capsule (300 mg total) by mouth 3 (three) times daily., Disp: 21 capsule, Rfl: 0   Acetaminophen (MAPAP ACETAMINOPHEN EXTRA STR) 167 MG/5ML LIQD, Take 15 mLs by mouth every 6 (six) hours as needed (for pain.)., Disp: , Rfl:    aspirin EC 81 MG tablet, Take 81 mg by mouth daily., Disp: , Rfl:    celecoxib (CELEBREX) 200 MG capsule, Take 200 mg by mouth daily as needed (for arthritic pain.). , Disp: , Rfl: 1   cetirizine (ZYRTEC) 10 MG tablet, Take 10 mg by mouth daily., Disp: , Rfl:    Cholecalciferol (VITAMIN D3) 2000 UNITS TABS, Take 2,000 Units by mouth every morning. , Disp: , Rfl:    clotrimazole-betamethasone (LOTRISONE) cream, Apply 1 application topically daily as needed. For skin rashes., Disp: , Rfl:    Cyanocobalamin (VITAMIN B-12 CR PO), Place 5,000 mcg under the tongue every morning. , Disp: , Rfl:    diltiazem (DILACOR XR) 180 MG 24 hr capsule, Take 1 capsule (180 mg total) by mouth daily. (Patient taking differently: Take 180 mg by mouth at bedtime. ), Disp: 90 capsule, Rfl: 3   escitalopram (LEXAPRO) 10 MG tablet, Take 10 mg by mouth daily., Disp: , Rfl: 1   esomeprazole (NEXIUM) 40 MG capsule, Take 40 mg by mouth every morning. , Disp: , Rfl:    fluticasone (FLONASE) 50 MCG/ACT nasal spray, PLACE 2 SPRAYS INTO BOTH NOSTRILS 2 (TWO) TIMES DAILY., Disp: 16 g, Rfl: 0   HYDROcodone-acetaminophen (NORCO/VICODIN) 5-325 MG tablet, Take 1 tablet by mouth every 6 (six) hours as needed for moderate pain., Disp: , Rfl:    levothyroxine (SYNTHROID, LEVOTHROID) 75 MCG tablet, Take 75 mcg by mouth daily before breakfast., Disp: , Rfl:     Liniments (BLUE-EMU SUPER STRENGTH) CREA, Apply 1 application topically 3 (three) times daily as needed (for joint pain.)., Disp: , Rfl:    Multiple Vitamins-Minerals (MULTIVITAMIN WITH MINERALS) tablet, Take 1 tablet by mouth 2 (two) times daily. , Disp: , Rfl:    Olopatadine HCl (PATADAY) 0.2 % SOLN, Apply 1 drop to eye daily. , Disp: , Rfl:    PROAIR HFA 108 (90 Base) MCG/ACT inhaler, INHALE 2 PUFFS INTO THE LUNGS EVERY 6 (SIX) HOURS AS NEEDED FOR WHEEZING OR SHORTNESS OF BREATH., Disp: 8.5 Inhaler, Rfl: 3   Propylene Glycol-Glycerin (SOOTHE) 0.6-0.6 % SOLN, Apply 1 drop to eye 3 (three) times daily., Disp: , Rfl:    Simethicone (GAS-X EXTRA STRENGTH) 62.5 MG STRP, Take 2-4 strips by mouth 4 (four) times daily as needed (for gas). **do not exceed 8 strips in 24 hrs.**, Disp: , Rfl:  SPIRIVA HANDIHALER 18 MCG inhalation capsule, INHALE 1 CAPSULE VIA HANDIHALER ONCE DAILY AT THE SAME TIME EVERY DAY, Disp: 30 capsule, Rfl: 0   torsemide (DEMADEX) 20 MG tablet, TAKE 1 TABLET BY MOUTH TWICE A DAY, Disp: 180 tablet, Rfl: 3  Observations/Objective: Patient is well-developed, well-nourished in no acute distress.  Resting comfortably at home.  Head is normocephalic, atraumatic.  No labored breathing. Speech is clear and coherent with logical content.  Patient is alert and oriented at baseline.  Mild maxillary and suborbital erythema with mild swelling noted. EOMI.  Assessment and Plan: 1. Dental infection - clindamycin (CLEOCIN) 300 MG capsule; Take 1 capsule (300 mg total) by mouth 3 (three) times daily.  Dispense: 21 capsule; Refill: 0 2. Facial cellulitis - clindamycin (CLEOCIN) 300 MG capsule; Take 1 capsule (300 mg total) by mouth 3 (three) times daily.  Dispense: 21 capsule; Refill: 0 Start Clindamycin TID with food. Start probiotic. Tylenol for pain. Supportive measures reviewed. Strict ER precautions reviewed. She is to go there for any worsening symptoms. If symptoms are not improving  within 36-48 hours she needs to at least be seen by PCP or UC since she has not dental provider.  Follow Up Instructions: I discussed the assessment and treatment plan with the patient. The patient was provided an opportunity to ask questions and all were answered. The patient agreed with the plan and demonstrated an understanding of the instructions.  A copy of instructions were sent to the patient via MyChart unless otherwise noted below.   The patient was advised to call back or seek an in-person evaluation if the symptoms worsen or if the condition fails to improve as anticipated.  Time:  I spent 12 minutes with the patient via telehealth technology discussing the above problems/concerns.    Leeanne Rio, PA-C

## 2021-01-23 DIAGNOSIS — E039 Hypothyroidism, unspecified: Secondary | ICD-10-CM | POA: Diagnosis not present

## 2021-01-23 DIAGNOSIS — E7849 Other hyperlipidemia: Secondary | ICD-10-CM | POA: Diagnosis not present

## 2021-03-27 DIAGNOSIS — G894 Chronic pain syndrome: Secondary | ICD-10-CM | POA: Diagnosis not present

## 2021-04-02 DIAGNOSIS — G4733 Obstructive sleep apnea (adult) (pediatric): Secondary | ICD-10-CM | POA: Diagnosis not present

## 2021-05-03 DIAGNOSIS — G4733 Obstructive sleep apnea (adult) (pediatric): Secondary | ICD-10-CM | POA: Diagnosis not present

## 2021-06-02 DIAGNOSIS — G4733 Obstructive sleep apnea (adult) (pediatric): Secondary | ICD-10-CM | POA: Diagnosis not present

## 2021-06-19 DIAGNOSIS — G894 Chronic pain syndrome: Secondary | ICD-10-CM | POA: Diagnosis not present

## 2021-06-19 DIAGNOSIS — E039 Hypothyroidism, unspecified: Secondary | ICD-10-CM | POA: Diagnosis not present

## 2021-07-03 DIAGNOSIS — G4733 Obstructive sleep apnea (adult) (pediatric): Secondary | ICD-10-CM | POA: Diagnosis not present

## 2021-07-04 DIAGNOSIS — G4733 Obstructive sleep apnea (adult) (pediatric): Secondary | ICD-10-CM | POA: Diagnosis not present

## 2021-07-18 DIAGNOSIS — G4733 Obstructive sleep apnea (adult) (pediatric): Secondary | ICD-10-CM | POA: Diagnosis not present

## 2021-08-02 DIAGNOSIS — G4733 Obstructive sleep apnea (adult) (pediatric): Secondary | ICD-10-CM | POA: Diagnosis not present

## 2021-08-06 ENCOUNTER — Ambulatory Visit
Admission: EM | Admit: 2021-08-06 | Discharge: 2021-08-06 | Disposition: A | Discharge status: Home / Self Care | Payer: 59 | Attending: Nurse Practitioner | Admitting: Nurse Practitioner

## 2021-08-06 DIAGNOSIS — W5503XA Scratched by cat, initial encounter: Secondary | ICD-10-CM

## 2021-08-06 DIAGNOSIS — S80812A Abrasion, left lower leg, initial encounter: Secondary | ICD-10-CM | POA: Diagnosis not present

## 2021-08-06 DIAGNOSIS — L03116 Cellulitis of left lower limb: Secondary | ICD-10-CM

## 2021-08-06 MED ORDER — FLUCONAZOLE 150 MG PO TABS: 150.0000 mg | ORAL_TABLET | Freq: Once | ORAL | 0 refills | Status: AC

## 2021-08-06 MED ORDER — NYSTATIN 100000 UNIT/ML MT SUSP: 500000.0000 [IU] | Freq: Four times a day (QID) | OROMUCOSAL | 0 refills | Status: AC

## 2021-08-06 MED ORDER — AMOXICILLIN-POT CLAVULANATE 875-125 MG PO TABS: 1.0000 | ORAL_TABLET | Freq: Two times a day (BID) | ORAL | 0 refills | Status: AC

## 2021-08-06 NOTE — ED Provider Notes (Signed)
[facility]    CSN: [id number] Arrival date & time: 08/06/21  1511      History   Chief Complaint Chief Complaint  Patient presents with   Leg Swelling    HPI Meghan Hoover is Hoover 61 y.o. female.   Patient presents with left lower extremity pain, redness, and drainage since her cat scratched her about 1 week ago.  Reports initially, she felt like she did have Hoover fever, some upset stomach, headache, night sweats at home.  Her symptoms are now improved that she is taking ibuprofen and Tylenol alternating.  Reports history of cellulitis.  Denies antibiotic use for cellulitis in the past 3 months.  Blood pressure significantly elevated today-patient denies headache, chest pain, shortness of breath, vision changes, dizziness/lightheadedness.  She reports her blood pressure this morning with her home monitor was 131/85.  Reports usually with automatic machines, blood pressure reading is not accurate.  Medical history significant for acute respiratory failure, hypertension, COPD, heart failure, morbid obesity, sleep apnea.    Past Medical History:  Diagnosis Date   Anxiety    Bilateral lower extremity edema    hx of 2014    Bronchitis    hx of    CHF (congestive heart failure) (HCC)    COPD (chronic obstructive pulmonary disease) (HCC)    Coronary artery disease    Depression    Dysphagia    GERD (gastroesophageal reflux disease)    Heart failure ([place])    History of frequent urinary tract infections    last one 10 years ago    Hypertension    Hypothyroid    Numbness    right hand since 2014   Pneumonia    2014   Shortness of breath dyspnea    with exertion    Sleep apnea    uses CPAP   Urinary incontinence     Patient Active Problem List   Diagnosis Date Noted   DOE (dyspnea on exertion)    Obstructive sleep apnea 10/04/2014   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   Morbid obesity with BMI of 60.0-69.9, adult ([place]) 06/27/2014   Morbid  obesity ([place]) 08/24/2013   Encounter for screening colonoscopy 05/20/2013   COPD (chronic obstructive pulmonary disease) ([place]) 04/30/2013   Allergic rhinitis 04/30/2013   CAP (community acquired pneumonia) 70/01/7492   Acute diastolic congestive heart failure ([place]) 11/20/2012   Acute respiratory failure with hypoxia ([place]) 11/19/2012   Volume overload 11/19/2012   Bilateral lower extremity edema 11/19/2012   HTN (hypertension) 11/19/2012   Normocytic anemia 11/19/2012   Tobacco dependence 11/19/2012   ACHILLES TENDINITIS 03/23/2008    Past Surgical History:  Procedure Laterality Date   ACHILLES TENDON SURGERY     BACK SURGERY     x4   CARDIAC CATHETERIZATION N/Hoover 10/24/2015   Procedure: Right/Left Heart Cath and Coronary Angiography;  Surgeon: Jettie Booze, MD;  Location: [facility];  Service: Cardiovascular;  Laterality: N/Hoover;   COLONOSCOPY N/Hoover 05/24/2013   Procedure: COLONOSCOPY;  Surgeon: Daneil Dolin, MD;  Location: [facility];  Service: Endoscopy;  Laterality: N/Hoover;  3:00 PM   GASTROSTOMY W/ FEEDING TUBE     hx of 2014    LAPAROSCOPIC ROUX-EN-Y GASTRIC BYPASS WITH UPPER ENDOSCOPY AND REMOVAL OF LAP BAND N/Hoover 06/27/2014   Procedure: LAPAROSCOPIC ROUX-EN-Y GASTRIC BYPASS   WITH UPPER ENDOSCOPY;  Surgeon: Johnathan Hausen, MD;  Location: [facility];  Service: General;  Laterality: N/Hoover;  swallowing evaluation      hx of    TONSILECTOMY, ADENOIDECTOMY, BILATERAL MYRINGOTOMY AND TUBES     TONSILLECTOMY     TRACHEOSTOMY     11/2012   TRACHEOSTOMY CLOSURE     11/2012   UPPER GI ENDOSCOPY  06/27/2014   Procedure: UPPER GI ENDOSCOPY;  Surgeon: Johnathan Hausen, MD;  Location: [facility];  Service: General;;    OB History   No obstetric history on file.      Home Medications    Prior to Admission medications   Medication Sig Start Date End Date Taking? Authorizing Provider  amoxicillin-clavulanate (AUGMENTIN) 875-125 MG tablet Take 1 tablet by mouth 2 (two) times daily for 7  days. 08/06/21 08/13/21 Yes Eulogio Bear, NP  fluconazole (DIFLUCAN) 150 MG tablet Take 1 tablet (150 mg total) by mouth once for 1 dose. 08/06/21 08/06/21 Yes Eulogio Bear, NP  nystatin (MYCOSTATIN) 100000 UNIT/ML suspension Take 5 mLs (500,000 Units total) by mouth 4 (four) times daily for 7 days. 08/06/21 08/13/21 Yes Eulogio Bear, NP  Acetaminophen (MAPAP ACETAMINOPHEN EXTRA STR) 167 MG/5ML LIQD Take 15 mLs by mouth every 6 (six) hours as needed (for pain.).    [provider]  aspirin EC 81 MG tablet Take 81 mg by mouth daily.    [provider]  celecoxib (CELEBREX) 200 MG capsule Take 200 mg by mouth daily as needed (for arthritic pain.).  08/27/15   [provider]  cetirizine (ZYRTEC) 10 MG tablet Take 10 mg by mouth daily.    [provider]  Cholecalciferol (VITAMIN D3) 2000 UNITS TABS Take 2,000 Units by mouth every morning.     [provider]  clotrimazole-betamethasone (LOTRISONE) cream Apply 1 application topically daily as needed. For skin rashes. 09/30/15   [provider]  Cyanocobalamin (VITAMIN B-12 CR PO) Place 5,000 mcg under the tongue every morning.     [provider]  diltiazem (DILACOR XR) 180 MG 24 hr capsule Take 1 capsule (180 mg total) by mouth daily. Patient taking differently: Take 180 mg by mouth at bedtime.  03/01/13   Arnoldo Lenis, MD  escitalopram (LEXAPRO) 10 MG tablet Take 10 mg by mouth daily. 09/24/15   [provider]  esomeprazole (NEXIUM) 40 MG capsule Take 40 mg by mouth every morning.     [provider]  fluticasone (FLONASE) 50 MCG/ACT nasal spray PLACE 2 SPRAYS INTO BOTH NOSTRILS 2 (TWO) TIMES DAILY. 01/06/17   Collene Gobble, MD  HYDROcodone-acetaminophen (NORCO/VICODIN) 5-325 MG tablet Take 1 tablet by mouth every 6 (six) hours as needed for moderate pain.    [provider]  levothyroxine (SYNTHROID, LEVOTHROID) 75 MCG tablet Take 75 mcg by mouth  daily before breakfast.    [provider]  Liniments (BLUE-EMU SUPER STRENGTH) CREA Apply 1 application topically 3 (three) times daily as needed (for joint pain.).    [provider]  Multiple Vitamins-Minerals (MULTIVITAMIN WITH MINERALS) tablet Take 1 tablet by mouth 2 (two) times daily.     [provider]  Olopatadine HCl (PATADAY) 0.2 % SOLN Apply 1 drop to eye daily.     [provider]  PROAIR HFA 108 (90 Base) MCG/ACT inhaler INHALE 2 PUFFS INTO THE LUNGS EVERY 6 (SIX) HOURS AS NEEDED FOR WHEEZING OR SHORTNESS OF BREATH. 08/21/15   Collene Gobble, MD  Propylene Glycol-Glycerin (SOOTHE) 0.6-0.6 % SOLN Apply 1 drop to eye 3 (three) times daily.    [provider]  Simethicone (GAS-X EXTRA STRENGTH) 62.5 MG STRP Take 2-4 strips by mouth 4 (four) times daily as needed (for gas). **do not exceed 8 strips in 24 hrs.**    [provider]  SPIRIVA HANDIHALER 18 MCG inhalation capsule INHALE 1 CAPSULE VIA HANDIHALER ONCE DAILY AT THE SAME TIME EVERY DAY 01/06/17   Collene Gobble, MD  torsemide (DEMADEX) 20 MG tablet TAKE 1 TABLET BY MOUTH TWICE Hoover DAY 01/30/17   Branch, Alphonse Guild, MD    Family History Family History  Problem Relation Age of Onset   Heart disease Mother    Emphysema Father    Heart disease Father    Cancer Father        lung   Diabetes Sister    Asthma Sister    Colon cancer Neg Hx     Social History Social History   Tobacco Use   Smoking status: Former    Packs/day: 3.00    Years: 40.00    Total pack years: 120.00    Types: Cigarettes    Start date: 01/22/1971    Quit date: 11/19/2012    Years since quitting: 8.7   Smokeless tobacco: Never  Substance Use Topics   Alcohol use: No    Alcohol/week: 0.0 standard drinks of alcohol    Comment: social drank quit 5 years ago    Drug use: No     Allergies   Augmentin [amoxicillin-pot clavulanate]   Review of Systems Review of Systems Per HPI  Physical  Exam Triage Vital Signs ED Triage Vitals  Enc Vitals Group     BP 08/06/21 1533 (!) 202/97     Pulse Rate 08/06/21 1533 82     Resp 08/06/21 1533 (!) 22     Temp 08/06/21 1533 97.8 F (36.6 C)     Temp src --      SpO2 08/06/21 1533 92 %     Weight --      Height --      Head Circumference --      Peak Flow --      Pain Score 08/06/21 1531 8     Pain Loc --      Pain Edu? --      Excl. in [place]? --    No data found.  Updated Vital Signs BP (!) 202/97   Pulse 82   Temp 97.8 F (36.6 C)   Resp (!) 22   SpO2 92% Comment: states this is baseline  BP today at home per patient: 131/85  Visual Acuity Right Eye Distance:   Left Eye Distance:   Bilateral Distance:    Right Eye Near:   Left Eye Near:    Bilateral Near:     Physical Exam Vitals and nursing note reviewed.  Constitutional:      General: She is not in acute distress.    Appearance: Normal appearance. She is obese. She is not ill-appearing, toxic-appearing or diaphoretic.  HENT:     Mouth/Throat:     Mouth: Mucous membranes are moist.     Pharynx: Oropharynx is clear.  Eyes:     General: No scleral icterus.    Extraocular Movements: Extraocular movements intact.  Pulmonary:     Effort: Pulmonary effort is normal. No respiratory distress.  Musculoskeletal:     Right lower leg: Edema present.     Left lower leg: Edema present.  Skin:    General: Skin is warm and dry.  Capillary Refill: Capillary refill takes less than 2 seconds.     Findings: Erythema and wound present. No rash.          Comments: Left lower extremity erythematous, tender to light palpation.  Additionally, there are areas of oozing purulent discharge.  No decreased range of motion, tenderness to the joints.  Left dorsalis pedis pulse +2, sensation intact to toes, capillary refill less than 3 seconds.  Neurological:     Mental Status: She is alert and oriented to person, place, and time.  Psychiatric:        Behavior: Behavior is  cooperative.      UC Treatments / Results  Labs (all labs ordered are listed, but only abnormal results are displayed) Labs Reviewed - No data to display  EKG   Radiology No results found.  Procedures Procedures (including critical care time)  Medications Ordered in UC Medications - No data to display  Initial Impression / Assessment and Plan / UC Course  I have reviewed the triage vital signs and the nursing notes.  Pertinent labs & imaging results that were available during my care of the patient were reviewed by me and considered in my medical decision making (see chart for details).    Patient is Hoover very pleasant, well-appearing 61 year old female presenting for left lower extremity cellulitis after her cat scratched her.  We will treat for cat bite with Augmentin-she has Hoover history of thrush, nausea/vomiting with this medication.  Not true allergic reaction, or other side effect.  We discussed pros versus cons, other antibiotic choices including combination antibiotics, fluoroquinolones.  We decided together that the Augmentin would be the best option given the side effects/risks with other medications.  We will send in empiric treatment for thrush/yeast infection should symptoms or signs of this develop.  Encouraged elevation, cool compresses to left lower extremity.  Seek care if symptoms persist or worsen despite treatment.  Blood pressure significantly elevated today; patient reports her blood pressure today at home was 131/85; usually the monitors in ambulatory offices are "off" for her.  She does not have any red flag symptoms today that warrant ER evaluation.  Encourage continue closely monitoring blood pressure at home, follow-up with primary care provider if blood pressure elevated greater than 130/80 at home. Final Clinical Impressions(s) / UC Diagnoses   Final diagnoses:  Cellulitis of left lower extremity  Cat scratch of left lower leg, initial encounter      Discharge Instructions      - Please start the Augmentin and take it twice daily for 7 days to treat the infection in your left leg -If you develop signs of thrush, or yeast infection, please take the Diflucan and start using the nystatin rinse swish and swallow 4 times daily for 7 days -Please seek care if your symptoms persist or worsen despite treatment    ED Prescriptions     Medication Sig Dispense Auth. Provider   amoxicillin-clavulanate (AUGMENTIN) 875-125 MG tablet Take 1 tablet by mouth 2 (two) times daily for 7 days. 14 tablet Meghan Chapel A, NP   fluconazole (DIFLUCAN) 150 MG tablet Take 1 tablet (150 mg total) by mouth once for 1 dose. 1 tablet Meghan Chapel A, NP   nystatin (MYCOSTATIN) 100000 UNIT/ML suspension Take 5 mLs (500,000 Units total) by mouth 4 (four) times daily for 7 days. 473 mL Eulogio Bear, NP      PDMP not reviewed this encounter.   Eulogio Bear, NP 08/06/21 [phone]

## 2021-08-06 NOTE — ED Triage Notes (Signed)
Pt presents with left leg swelling and redness, pt believes to be cellulitis

## 2021-08-06 NOTE — Discharge Instructions (Signed)
-   Please start the Augmentin and take it twice daily for 7 days to treat the infection in your left leg -If you develop signs of thrush, or yeast infection, please take the Diflucan and start using the nystatin rinse swish and swallow 4 times daily for 7 days -Please seek care if your symptoms persist or worsen despite treatment

## 2021-08-23 DIAGNOSIS — U071 COVID-19: Secondary | ICD-10-CM | POA: Diagnosis not present

## 2021-09-02 DIAGNOSIS — G4733 Obstructive sleep apnea (adult) (pediatric): Secondary | ICD-10-CM | POA: Diagnosis not present

## 2021-09-07 ENCOUNTER — Ambulatory Visit (INDEPENDENT_AMBULATORY_CARE_PROVIDER_SITE_OTHER): Payer: 59

## 2021-09-07 ENCOUNTER — Ambulatory Visit
Admission: EM | Admit: 2021-09-07 | Discharge: 2021-09-07 | Disposition: A | Discharge status: Home / Self Care | Payer: 59 | Attending: Nurse Practitioner | Admitting: Nurse Practitioner

## 2021-09-07 ENCOUNTER — Encounter: Payer: Self-pay | Admitting: *Deleted

## 2021-09-07 DIAGNOSIS — J441 Chronic obstructive pulmonary disease with (acute) exacerbation: Secondary | ICD-10-CM | POA: Diagnosis not present

## 2021-09-07 DIAGNOSIS — J0100 Acute maxillary sinusitis, unspecified: Secondary | ICD-10-CM | POA: Diagnosis not present

## 2021-09-07 DIAGNOSIS — R0602 Shortness of breath: Secondary | ICD-10-CM

## 2021-09-07 MED ORDER — DOXYCYCLINE HYCLATE 100 MG PO CAPS: 100.0000 mg | ORAL_CAPSULE | Freq: Two times a day (BID) | ORAL | 0 refills | Status: AC

## 2021-09-07 MED ORDER — PREDNISONE 20 MG PO TABS: 40.0000 mg | ORAL_TABLET | Freq: Every day | ORAL | 0 refills | Status: AC

## 2021-09-07 MED ORDER — BENZONATATE 100 MG PO CAPS: 100.0000 mg | ORAL_CAPSULE | Freq: Three times a day (TID) | ORAL | 0 refills | Status: DC | PRN

## 2021-09-07 NOTE — ED Triage Notes (Signed)
Pt states that 8/3 she was COVID positive did antiviral, since she has been SOB and cant get rid of the congestion. Is having some dry mouth.

## 2021-09-07 NOTE — Discharge Instructions (Signed)
-   Please take the prednisone daily in the morning for 5 days to treat the inflammation in your lungs - Start the doxycycline and take the entire course to treat the sinusitis - Continue hydration with plenty of fluids and guaifenesin 600 mg twice daily to help loosen phlegm - You can use Tessalon perles every 8 hours as needed for cough.  Do not take this medicine while driving or operating heavy machinery.

## 2021-09-07 NOTE — ED Provider Notes (Signed)
[facility]    CSN: [id number] Arrival date & time: 09/07/21  1427      History   Chief Complaint Chief Complaint  Patient presents with   Cough   Shortness of Breath    HPI Meghan Hoover is a 61 y.o. female.   Patient presents with 2 weeks of congestion, sweats and chills, cough, increase in shortness of breath, wheezing, increase in productive sputum, and chest tightness.  She is also having ear pressure, pain in her face, sinus tenderness, postnasal drainage.  She denies sore throat, ear drainage, fever, abdominal pain, nausea/vomiting, change in appetite, and chest pain.  Reports she has been more tired than normal.  She tested positive for COVID-19 on 8/3 and was started on Paxlovid which she has completed the entire course of.  Reports the symptoms have persisted.  She has taken over-the-counter Tylenol Sinus and cold as well as Mucinex, Robitussin without much relief.  Medical history significant for hypertension, acute congestive heart failure, community-acquired pneumonia, COPD, morbid obesity.  Reports good compliance with her daily medication.    Past Medical History:  Diagnosis Date   Anxiety    Bilateral lower extremity edema    hx of 2014    Bronchitis    hx of    CHF (congestive heart failure) (HCC)    COPD (chronic obstructive pulmonary disease) (HCC)    Coronary artery disease    Depression    Dysphagia    GERD (gastroesophageal reflux disease)    Heart failure ([place])    History of frequent urinary tract infections    last one 10 years ago    Hypertension    Hypothyroid    Numbness    right hand since 2014   Pneumonia    2014   Shortness of breath dyspnea    with exertion    Sleep apnea    uses CPAP   Urinary incontinence     Patient Active Problem List   Diagnosis Date Noted   DOE (dyspnea on exertion)    Obstructive sleep apnea 10/04/2014   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   Morbid obesity with BMI of  60.0-69.9, adult ([place]) 06/27/2014   Morbid obesity ([place]) 08/24/2013   Encounter for screening colonoscopy 05/20/2013   COPD (chronic obstructive pulmonary disease) ([place]) 04/30/2013   Allergic rhinitis 04/30/2013   CAP (community acquired pneumonia) 50/53/9767   Acute diastolic congestive heart failure ([place]) 11/20/2012   Acute respiratory failure with hypoxia ([place]) 11/19/2012   Volume overload 11/19/2012   Bilateral lower extremity edema 11/19/2012   HTN (hypertension) 11/19/2012   Normocytic anemia 11/19/2012   Tobacco dependence 11/19/2012   ACHILLES TENDINITIS 03/23/2008    Past Surgical History:  Procedure Laterality Date   ACHILLES TENDON SURGERY     BACK SURGERY     x4   CARDIAC CATHETERIZATION N/A 10/24/2015   Procedure: Right/Left Heart Cath and Coronary Angiography;  Surgeon: Jettie Booze, MD;  Location: [facility];  Service: Cardiovascular;  Laterality: N/A;   COLONOSCOPY N/A 05/24/2013   Procedure: COLONOSCOPY;  Surgeon: Daneil Dolin, MD;  Location: [facility];  Service: Endoscopy;  Laterality: N/A;  3:00 PM   GASTROSTOMY W/ FEEDING TUBE     hx of 2014    LAPAROSCOPIC ROUX-EN-Y GASTRIC BYPASS WITH UPPER ENDOSCOPY AND REMOVAL OF LAP BAND N/A 06/27/2014   Procedure: LAPAROSCOPIC ROUX-EN-Y GASTRIC BYPASS   WITH UPPER ENDOSCOPY;  Surgeon: Johnathan Hausen, MD;  Location: [facility];  Service: General;  Laterality: N/A;   swallowing evaluation      hx of    TONSILECTOMY, ADENOIDECTOMY, BILATERAL MYRINGOTOMY AND TUBES     TONSILLECTOMY     TRACHEOSTOMY     11/2012   TRACHEOSTOMY CLOSURE     11/2012   UPPER GI ENDOSCOPY  06/27/2014   Procedure: UPPER GI ENDOSCOPY;  Surgeon: Johnathan Hausen, MD;  Location: [facility];  Service: General;;    OB History   No obstetric history on file.      Home Medications    Prior to Admission medications   Medication Sig Start Date End Date Taking? Authorizing Provider  Acetaminophen (MAPAP ACETAMINOPHEN EXTRA STR) 167 MG/5ML  LIQD Take 15 mLs by mouth every 6 (six) hours as needed (for pain.).   Yes [provider]  aspirin EC 81 MG tablet Take 81 mg by mouth daily.   Yes [provider]  benzonatate (TESSALON) 100 MG capsule Take 1 capsule (100 mg total) by mouth 3 (three) times daily as needed for cough. Do not take with alcohol or while driving or operating heavy machinery 09/07/21  Yes Noemi Chapel A, NP  celecoxib (CELEBREX) 200 MG capsule Take 200 mg by mouth daily as needed (for arthritic pain.).  08/27/15  Yes [provider]  cetirizine (ZYRTEC) 10 MG tablet Take 10 mg by mouth daily.   Yes [provider]  Cholecalciferol (VITAMIN D3) 2000 UNITS TABS Take 2,000 Units by mouth every morning.    Yes [provider]  clotrimazole-betamethasone (LOTRISONE) cream Apply 1 application topically daily as needed. For skin rashes. 09/30/15  Yes [provider]  Cyanocobalamin (VITAMIN B-12 CR PO) Place 5,000 mcg under the tongue every morning.    Yes [provider]  diltiazem (DILACOR XR) 180 MG 24 hr capsule Take 1 capsule (180 mg total) by mouth daily. Patient taking differently: Take 180 mg by mouth at bedtime. 03/01/13  Yes Branch, Alphonse Guild, MD  doxycycline (VIBRAMYCIN) 100 MG capsule Take 1 capsule (100 mg total) by mouth 2 (two) times daily for 7 days. 09/07/21 09/14/21 Yes Noemi Chapel A, NP  escitalopram (LEXAPRO) 10 MG tablet Take 10 mg by mouth daily. 09/24/15  Yes [provider]  esomeprazole (NEXIUM) 40 MG capsule Take 40 mg by mouth every morning.    Yes [provider]  fluticasone (FLONASE) 50 MCG/ACT nasal spray PLACE 2 SPRAYS INTO BOTH NOSTRILS 2 (TWO) TIMES DAILY. 01/06/17  Yes Collene Gobble, MD  HYDROcodone-acetaminophen (NORCO/VICODIN) 5-325 MG tablet Take 1 tablet by mouth every 6 (six) hours as needed for moderate pain.   Yes [provider]  levothyroxine (SYNTHROID, LEVOTHROID) 75 MCG tablet Take 75 mcg by  mouth daily before breakfast.   Yes [provider]  Liniments (BLUE-EMU SUPER STRENGTH) CREA Apply 1 application topically 3 (three) times daily as needed (for joint pain.).   Yes [provider]  Multiple Vitamins-Minerals (MULTIVITAMIN WITH MINERALS) tablet Take 1 tablet by mouth 2 (two) times daily.    Yes [provider]  Olopatadine HCl (PATADAY) 0.2 % SOLN Apply 1 drop to eye daily.    Yes [provider]  predniSONE (DELTASONE) 20 MG tablet Take 2 tablets (40 mg total) by mouth daily with breakfast for 5 days. 09/07/21 09/12/21 Yes Eulogio Bear, NP  PROAIR HFA 108 (90 Base) MCG/ACT inhaler INHALE 2 PUFFS INTO THE LUNGS EVERY 6 (SIX) HOURS AS NEEDED FOR WHEEZING OR SHORTNESS OF  BREATH. 08/21/15  Yes Byrum, Rose Fillers, MD  Propylene Glycol-Glycerin (SOOTHE) 0.6-0.6 % SOLN Apply 1 drop to eye 3 (three) times daily.   Yes [provider]  Simethicone (GAS-X EXTRA STRENGTH) 62.5 MG STRP Take 2-4 strips by mouth 4 (four) times daily as needed (for gas). **do not exceed 8 strips in 24 hrs.**   Yes [provider]  SPIRIVA HANDIHALER 18 MCG inhalation capsule INHALE 1 CAPSULE VIA HANDIHALER ONCE DAILY AT THE SAME TIME EVERY DAY 01/06/17  Yes Collene Gobble, MD  torsemide (DEMADEX) 20 MG tablet TAKE 1 TABLET BY MOUTH TWICE A DAY 01/30/17  Yes Branch, Alphonse Guild, MD    Family History Family History  Problem Relation Age of Onset   Heart disease Mother    Emphysema Father    Heart disease Father    Cancer Father        lung   Diabetes Sister    Asthma Sister    Colon cancer Neg Hx     Social History Social History   Tobacco Use   Smoking status: Former    Packs/day: 3.00    Years: 40.00    Total pack years: 120.00    Types: Cigarettes    Start date: 01/22/1971    Quit date: 11/19/2012    Years since quitting: 8.8   Smokeless tobacco: Never  Vaping Use   Vaping Use: Never used  Substance Use Topics   Alcohol use: No     Alcohol/week: 0.0 standard drinks of alcohol    Comment: social drank quit 5 years ago    Drug use: No     Allergies   Augmentin [amoxicillin-pot clavulanate]   Review of Systems Review of Systems Per HPI  Physical Exam Triage Vital Signs ED Triage Vitals  Enc Vitals Group     BP 09/07/21 1440 (!) 174/97     Pulse Rate 09/07/21 1440 84     Resp 09/07/21 1440 20     Temp 09/07/21 1440 98.6 F (37 C)     Temp Source 09/07/21 1440 Oral     SpO2 09/07/21 1440 94 %     Weight --      Height --      Head Circumference --      Peak Flow --      Pain Score 09/07/21 1436 0     Pain Loc --      Pain Edu? --      Excl. in [place]? --    No data found.  Updated Vital Signs BP (!) 174/97 (BP Location: Left Arm)   Pulse 84   Temp 98.6 F (37 C) (Oral)   Resp 20   SpO2 94%   Visual Acuity Right Eye Distance:   Left Eye Distance:   Bilateral Distance:    Right Eye Near:   Left Eye Near:    Bilateral Near:     Physical Exam Vitals and nursing note reviewed.  Constitutional:      General: She is not in acute distress.    Appearance: She is well-developed. She is obese. She is not toxic-appearing.  HENT:     Head: Normocephalic and atraumatic.     Right Ear: Tympanic membrane, ear canal and external ear normal.     Left Ear: Tympanic membrane, ear canal and external ear normal.     Nose: Congestion present. No rhinorrhea.     Mouth/Throat:     Mouth: Mucous membranes are moist.  Pharynx: Posterior oropharyngeal erythema present. No oropharyngeal exudate.  Eyes:     General:        Right eye: No discharge.        Left eye: No discharge.     Extraocular Movements: Extraocular movements intact.     Pupils: Pupils are equal, round, and reactive to light.  Cardiovascular:     Rate and Rhythm: Normal rate and regular rhythm.  Pulmonary:     Effort: Pulmonary effort is normal. No tachypnea, accessory muscle usage or respiratory distress.     Breath sounds: Normal  breath sounds. No decreased breath sounds, wheezing, rhonchi or rales.  Musculoskeletal:     Cervical back: Neck supple.  Lymphadenopathy:     Cervical: No cervical adenopathy.  Skin:    General: Skin is warm and dry.     Capillary Refill: Capillary refill takes less than 2 seconds.     Coloration: Skin is not cyanotic or pale.     Findings: No erythema or rash.  Neurological:     Mental Status: She is alert and oriented to person, place, and time.  Psychiatric:        Behavior: Behavior is cooperative.      UC Treatments / Results  Labs (all labs ordered are listed, but only abnormal results are displayed) Labs Reviewed - No data to display  EKG   Radiology DG Chest 2 View  Result Date: 09/07/2021 CLINICAL DATA:  Shortness of breath, history of COVID EXAM: CHEST - 2 VIEW COMPARISON:  06/22/2014 FINDINGS: Linear atelectasis or scarring in the lingula. Right lung clear. Heart is normal size. No effusions or acute bony abnormality. IMPRESSION: No active cardiopulmonary disease. Electronically Signed   By: Rolm Baptise M.D.   On: 09/07/2021 15:15    Procedures Procedures (including critical care time)  Medications Ordered in UC Medications - No data to display  Initial Impression / Assessment and Plan / UC Course  I have reviewed the triage vital signs and the nursing notes.  Pertinent labs & imaging results that were available during my care of the patient were reviewed by me and considered in my medical decision making (see chart for details).    Patient is a very pleasant, well-appearing 61 year old female presenting for cough and congestion with wheezing after COVID-19 2 weeks ago.  In triage, she is slightly hypertensive, although this is her baseline in ambulatory offices.  She monitors her blood pressure at home and is usually around 130s over 80s.  She has no symptoms of elevated blood pressure today including no chest pain, vision changes, headache,  dizziness/lightheadedness.  She is not tachycardic, afebrile, not tachypneic, is and is oxygenating above 90% on room air.  Chest x-ray obtained and negative for acute cardiopulmonary process.  We will treat wheezing/COPD exacerbation with prednisone 40 mg daily for 5 days.  Also start doxycycline to cover for COPD exacerbation/bacterial sinus infection that has likely developed.  Encouraged supportive care with hydration, Mucinex 600 mg twice daily, Tessalon Perles for cough.  ER precautions discussed.  Seek care if symptoms persist or worsen despite treatment.  The patient was given the opportunity to ask questions.  All questions answered to their satisfaction.  The patient is in agreement to this plan.   Final Clinical Impressions(s) / UC Diagnoses   Final diagnoses:  COPD exacerbation ([place])  Acute non-recurrent maxillary sinusitis     Discharge Instructions      - Please take the prednisone daily in the morning  for 5 days to treat the inflammation in your lungs - Start the doxycycline and take the entire course to treat the sinusitis - Continue hydration with plenty of fluids and guaifenesin 600 mg twice daily to help loosen phlegm - You can use Tessalon perles every 8 hours as needed for cough.  Do not take this medicine while driving or operating heavy machinery.     ED Prescriptions     Medication Sig Dispense Auth. Provider   predniSONE (DELTASONE) 20 MG tablet Take 2 tablets (40 mg total) by mouth daily with breakfast for 5 days. 10 tablet Noemi Chapel A, NP   doxycycline (VIBRAMYCIN) 100 MG capsule Take 1 capsule (100 mg total) by mouth 2 (two) times daily for 7 days. 14 capsule Noemi Chapel A, NP   benzonatate (TESSALON) 100 MG capsule Take 1 capsule (100 mg total) by mouth 3 (three) times daily as needed for cough. Do not take with alcohol or while driving or operating heavy machinery 21 capsule Eulogio Bear, NP      PDMP not reviewed this encounter.    Eulogio Bear, NP 09/07/21 [phone]

## 2021-09-17 ENCOUNTER — Telehealth: Payer: Self-pay

## 2021-09-17 MED ORDER — NYSTATIN 100000 UNIT/ML MT SUSP: 500000.0000 [IU] | Freq: Four times a day (QID) | OROMUCOSAL | 0 refills | Status: DC

## 2021-09-21 ENCOUNTER — Other Ambulatory Visit: Payer: Self-pay | Admitting: Nurse Practitioner

## 2021-09-25 DIAGNOSIS — G894 Chronic pain syndrome: Secondary | ICD-10-CM | POA: Diagnosis not present

## 2021-10-03 DIAGNOSIS — G4733 Obstructive sleep apnea (adult) (pediatric): Secondary | ICD-10-CM | POA: Diagnosis not present

## 2021-11-02 DIAGNOSIS — G4733 Obstructive sleep apnea (adult) (pediatric): Secondary | ICD-10-CM | POA: Diagnosis not present

## 2021-12-03 DIAGNOSIS — G4733 Obstructive sleep apnea (adult) (pediatric): Secondary | ICD-10-CM | POA: Diagnosis not present

## 2021-12-21 DIAGNOSIS — G894 Chronic pain syndrome: Secondary | ICD-10-CM | POA: Diagnosis not present

## 2021-12-21 DIAGNOSIS — R69 Illness, unspecified: Secondary | ICD-10-CM | POA: Diagnosis not present

## 2022-01-02 DIAGNOSIS — G4733 Obstructive sleep apnea (adult) (pediatric): Secondary | ICD-10-CM | POA: Diagnosis not present

## 2022-01-07 DIAGNOSIS — G4733 Obstructive sleep apnea (adult) (pediatric): Secondary | ICD-10-CM | POA: Diagnosis not present

## 2022-01-10 ENCOUNTER — Telehealth: Payer: 59 | Admitting: Family Medicine

## 2022-01-10 DIAGNOSIS — B338 Other specified viral diseases: Secondary | ICD-10-CM | POA: Diagnosis not present

## 2022-01-10 MED ORDER — BENZONATATE 100 MG PO CAPS: 100.0000 mg | ORAL_CAPSULE | Freq: Three times a day (TID) | ORAL | 0 refills | Status: DC | PRN

## 2022-01-10 MED ORDER — PREDNISONE 20 MG PO TABS: 20.0000 mg | ORAL_TABLET | Freq: Two times a day (BID) | ORAL | 0 refills | Status: AC

## 2022-01-10 NOTE — Progress Notes (Signed)
Virtual Visit Consent   Meghan Hoover, you are scheduled for a virtual visit with a [facility] provider today. Just as with appointments in the office, your consent must be obtained to participate. Your consent will be active for this visit and any virtual visit you may have with one of our providers in the next 365 days. If you have a MyChart account, a copy of this consent can be sent to you electronically.  As this is a virtual visit, video technology does not allow for your provider to perform a traditional examination. This may limit your provider's ability to fully assess your condition. If your provider identifies any concerns that need to be evaluated in person or the need to arrange testing (such as labs, EKG, etc.), we will make arrangements to do so. Although advances in technology are sophisticated, we cannot ensure that it will always work on either your end or our end. If the connection with a video visit is poor, the visit may have to be switched to a telephone visit. With either a video or telephone visit, we are not always able to ensure that we have a secure connection.  By engaging in this virtual visit, you consent to the provision of healthcare and authorize for your insurance to be billed (if applicable) for the services provided during this visit. Depending on your insurance coverage, you may receive a charge related to this service.  I need to obtain your verbal consent now. Are you willing to proceed with your visit today? Meghan Hoover has provided verbal consent on 01/10/2022 for a virtual visit (video or telephone). Dellia Nims, FNP  Date: 01/10/2022 4:21 PM  Virtual Visit via Video Note   I, Dellia Nims, connected with  Meghan Hoover  ([id number], [birth year]) on 01/10/22 at  4:15 PM EST by a video-enabled telemedicine application and verified that I am speaking with the correct person using two identifiers.  Location: Patient: Virtual Visit Location Patient:  Home Provider: Virtual Visit Location Provider: Home Office   I discussed the limitations of evaluation and management by telemedicine and the availability of in person appointments. The patient expressed understanding and agreed to proceed.    History of Present Illness: Meghan Hoover is a 61 y.o. who identifies as a female who was assigned female at birth, and is being seen today for exposure to rsv by her mother who is currently hospitalized with rsv. Pt reports fever, chills, cough, runny nose, wheezing and sob with copd. She is not diabetic. Sx for 2 days and worsening. 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   Morbid obesity with BMI of 60.0-69.9, adult ([place]) 06/27/2014   Morbid obesity ([place]) 08/24/2013   Encounter for screening colonoscopy 05/20/2013   COPD (chronic obstructive pulmonary disease) ([place]) 04/30/2013   Allergic rhinitis 04/30/2013   CAP (community acquired pneumonia) 43/32/9518   Acute diastolic congestive heart failure ([place]) 11/20/2012   Acute respiratory failure with hypoxia ([place]) 11/19/2012   Volume overload 11/19/2012   Bilateral lower extremity edema 11/19/2012   HTN (hypertension) 11/19/2012   Normocytic anemia 11/19/2012   Tobacco dependence 11/19/2012   ACHILLES TENDINITIS 03/23/2008    Allergies:  Allergies  Allergen Reactions   Augmentin [Amoxicillin-Pot Clavulanate] Other (See Comments)    Has patient had a PCN  reaction causing immediate rash, facial/tongue/throat swelling, SOB or lightheadedness with hypotension:NO Has patient had a PCN reaction causing severe rash involving mucus membranes or skin necrosis:NO Has patient had a PCN reaction that required hospitalization:NO  Has patient had a PCN reaction occurring within the last 10 years:NO If all of the above answers are "NO", then may proceed  with Cephalosporin use.   Thrush/Upset stomach/diarrhea   Medications:  Current Outpatient Medications:    predniSONE (DELTASONE) 20 MG tablet, Take 1 tablet (20 mg total) by mouth 2 (two) times daily with a meal for 5 days., Disp: 10 tablet, Rfl: 0   Acetaminophen (MAPAP ACETAMINOPHEN EXTRA STR) 167 MG/5ML LIQD, Take 15 mLs by mouth every 6 (six) hours as needed (for pain.)., Disp: , Rfl:    aspirin EC 81 MG tablet, Take 81 mg by mouth daily., Disp: , Rfl:    benzonatate (TESSALON) 100 MG capsule, Take 1 capsule (100 mg total) by mouth 3 (three) times daily as needed for cough. Do not take with alcohol or while driving or operating heavy machinery, Disp: 21 capsule, Rfl: 0   celecoxib (CELEBREX) 200 MG capsule, Take 200 mg by mouth daily as needed (for arthritic pain.). , Disp: , Rfl: 1   cetirizine (ZYRTEC) 10 MG tablet, Take 10 mg by mouth daily., Disp: , Rfl:    Cholecalciferol (VITAMIN D3) 2000 UNITS TABS, Take 2,000 Units by mouth every morning. , Disp: , Rfl:    clotrimazole-betamethasone (LOTRISONE) cream, Apply 1 application topically daily as needed. For skin rashes., Disp: , Rfl:    Cyanocobalamin (VITAMIN B-12 CR PO), Place 5,000 mcg under the tongue every morning. , Disp: , Rfl:    diltiazem (DILACOR XR) 180 MG 24 hr capsule, Take 1 capsule (180 mg total) by mouth daily. (Patient taking differently: Take 180 mg by mouth at bedtime.), Disp: 90 capsule, Rfl: 3   escitalopram (LEXAPRO) 10 MG tablet, Take 10 mg by mouth daily., Disp: , Rfl: 1   esomeprazole (NEXIUM) 40 MG capsule, Take 40 mg by mouth every morning. , Disp: , Rfl:    fluticasone (FLONASE) 50 MCG/ACT nasal spray, PLACE 2 SPRAYS INTO BOTH NOSTRILS 2 (TWO) TIMES DAILY., Disp: 16 g, Rfl: 0   HYDROcodone-acetaminophen (NORCO/VICODIN) 5-325 MG tablet, Take 1 tablet by mouth every 6 (six) hours as needed for moderate pain., Disp: , Rfl:    levothyroxine (SYNTHROID, LEVOTHROID) 75 MCG tablet, Take 75 mcg by mouth daily before  breakfast., Disp: , Rfl:    Liniments (BLUE-EMU SUPER STRENGTH) CREA, Apply 1 application topically 3 (three) times daily as needed (for joint pain.)., Disp: , Rfl:    Multiple Vitamins-Minerals (MULTIVITAMIN WITH MINERALS) tablet, Take 1 tablet by mouth 2 (two) times daily. , Disp: , Rfl:    nystatin (MYCOSTATIN) 100000 UNIT/ML suspension, Take 5 mLs (500,000 Units total) by mouth 4 (four) times daily., Disp: 60 mL, Rfl: 0   Olopatadine HCl (PATADAY) 0.2 % SOLN, Apply 1 drop to eye daily. , Disp: , Rfl:    PROAIR HFA 108 (90 Base) MCG/ACT inhaler, INHALE 2 PUFFS INTO THE LUNGS EVERY 6 (SIX) HOURS AS NEEDED FOR WHEEZING OR SHORTNESS OF BREATH., Disp: 8.5 Inhaler, Rfl: 3   Propylene Glycol-Glycerin (SOOTHE) 0.6-0.6 % SOLN, Apply 1 drop to eye 3 (three) times daily., Disp: , Rfl:    Simethicone (GAS-X EXTRA STRENGTH) 62.5 MG STRP, Take 2-4 strips by mouth 4 (four) times daily as needed (for gas). **do not exceed 8 strips in 24 hrs.**, Disp: ,  Rfl:    SPIRIVA HANDIHALER 18 MCG inhalation capsule, INHALE 1 CAPSULE VIA HANDIHALER ONCE DAILY AT THE SAME TIME EVERY DAY, Disp: 30 capsule, Rfl: 0   torsemide (DEMADEX) 20 MG tablet, TAKE 1 TABLET BY MOUTH TWICE A DAY, Disp: 180 tablet, Rfl: 3  Observations/Objective: Patient is well-developed, well-nourished in no acute distress.  Resting comfortably  at home.  Head is normocephalic, atraumatic.  No labored breathing.  Speech is clear and coherent with logical content.  Patient is alert and oriented at baseline.    Assessment and Plan: 1. RSV (respiratory syncytial virus infection)  Increase fluids, prednisone and tessalon. She has an albuterol inhaler. Urgent care if sx persist or worsen.   Follow Up Instructions: I discussed the assessment and treatment plan with the patient. The patient was provided an opportunity to ask questions and all were answered. The patient agreed with the plan and demonstrated an understanding of the instructions.  A copy  of instructions were sent to the patient via MyChart unless otherwise noted below.     The patient was advised to call back or seek an in-person evaluation if the symptoms worsen or if the condition fails to improve as anticipated.  Time:  I spent 10 minutes with the patient via telehealth technology discussing the above problems/concerns.    Dellia Nims, FNP

## 2022-01-10 NOTE — Patient Instructions (Signed)
Respiratory Syncytial Virus Infection, Adult Respiratory syncytial virus (RSV) infection is an infection caused by RSV, a common virus. This virus is similar to viruses that cause the common cold and the flu. RSV infection can affect the nose, throat, windpipe, and lungs (respiratory system). When the infection is severe, it can cause: Bronchiolitis. This condition causes inflammation of the air passages in the lungs (bronchioles). Pneumonia. This condition causes inflammation of the air sacs in the lungs. RSV infection spreads from person to person (is contagious) through droplets from coughs and sneezes (respiratory secretions). This condition is rarely serious when it occurs in adults. What are the causes? This condition is caused by contact with RSV. This can happen by: Breathing respiratory secretions from someone who has the infection. Touching something that has been exposed to the virus (is contaminated) and then touching your mouth, nose, or eyes. Coming in close contact with someone who has this infection. This may happen if you: Hug or kiss. Shake or hold hands. Eat or drink using the same dishes or utensils. What increases the risk? The following factors may make you more likely to develop this condition: Being 65 years of age or older. Having certain health conditions, including: A long-term (chronic) lung condition, such as chronic obstructive pulmonary disease (COPD). An immune system that is weak. This is your body's defense system. Down syndrome. Heart disease. Working in a hospital or other health care facility. Living in a long-term health care facility. RSV infections are most common from the months of November to April, but they can happen any time of year. What are the signs or symptoms? Symptoms of this condition include: Having a runny nose. Coughing. You may have a cough that brings up mucus (productive cough). Sneezing. Having a fever. Wanting to eat less than  usual. Breathing loudly (wheezing). Having shortness of breath. Having fluid build up in the lungs (respiratory distress). How is this diagnosed? This condition may be diagnosed based on: Your symptoms. Your medical history. A physical exam. A chest X-ray to rule out pneumonia. Blood tests or tests of mucus from your lungs (sputum). These tests may be done for older adults. A test of a sample of your respiratory secretions. How is this treated? In most cases, the RSV infection will go away after 1-2 weeks of caring for yourself at home.  Sometimes, RSV infection is severe and can cause bronchiolitis or pneumonia. If you develop one or both of these conditions, you may need to be treated in the hospital. You may be given: Oxygen therapy. Antiviral medicine. Medicines to open your bronchioles (bronchodilators). Follow these instructions at home: Medicines Take over-the-counter and prescription medicines only as told by your health care provider. If you were prescribed an antiviral medicine, take it as told by your health care provider. Do not stop using the antiviral even if you start to feel better. Lifestyle  Eat a healthy diet. Do not drink alcohol. Do not use any products that contain nicotine or tobacco, such as cigarettes, e-cigarettes, and chewing tobacco. If you need help quitting, ask your health care provider. Rest at home until your symptoms go away. Return to your normal activities as told by your health care provider. Ask your health care provider what activities are safe for you. General instructions  Drink enough fluid to keep your urine pale yellow. Gargle with a salt-water mixture 3-4 times a day or as needed. To make a salt-water mixture, completely dissolve -1 tsp (3-6 g) of salt in 1   cup (237 mL) of warm water. Keep all follow-up visits as told by your health care provider. This is important. How is this prevented? To prevent catching and spreading RSV: Wash  your hands often with soap and water for at least 20 seconds. If soap and water are not available, use hand sanitizer. Do not touch your face without first cleaning your hands. Stay home if you have symptoms of the common cold or the flu. Cover your nose and mouth when you cough or sneeze. Avoid large groups of people. Keep a safe distance of about 6 feet (1.8 m) from people who are coughing or sneezing. Where to find more information Centers for Disease Control and Prevention: www.cdc.gov Contact a health care provider if: Your symptoms get worse or have not changed after 2 weeks. You have: A fever. Hot flashes, sweating, or chills that keep happening. A cough that brings up much more mucus than usual. A cough that brings up blood. You feel: Very tired (lethargic). Confused. Get help right away if: You have increased or severe trouble breathing. You lose consciousness. These symptoms may represent a serious problem that is an emergency. Do not wait to see if the symptoms will go away. Get medical help right away. Call your local emergency services (911 in the [place] Do not drive yourself to the hospital. Summary Respiratory syncytial virus (RSV) infection is an infection caused by RSV, a common virus. RSV infection can affect the nose, throat, windpipe, and lungs (respiratory system). When the infection is severe, it can cause bronchiolitis or pneumonia. Take over-the-counter and prescription medicines only as told by your health care provider. Contact a health care provider if your symptoms get worse or have not changed after 2 weeks. This information is not intended to replace advice given to you by your health care provider. Make sure you discuss any questions you have with your health care provider. Document Revised: 10/21/2018 Document Reviewed: 10/28/2018 Elsevier Patient Education  2022 Elsevier Inc.  

## 2022-02-02 DIAGNOSIS — G4733 Obstructive sleep apnea (adult) (pediatric): Secondary | ICD-10-CM | POA: Diagnosis not present

## 2022-03-05 DIAGNOSIS — G4733 Obstructive sleep apnea (adult) (pediatric): Secondary | ICD-10-CM | POA: Diagnosis not present

## 2022-03-21 ENCOUNTER — Encounter: Payer: Self-pay | Admitting: Radiology

## 2022-04-09 DIAGNOSIS — G4733 Obstructive sleep apnea (adult) (pediatric): Secondary | ICD-10-CM | POA: Diagnosis not present

## 2022-04-26 DIAGNOSIS — Z9884 Bariatric surgery status: Secondary | ICD-10-CM | POA: Diagnosis not present

## 2022-04-26 DIAGNOSIS — E7849 Other hyperlipidemia: Secondary | ICD-10-CM | POA: Diagnosis not present

## 2022-04-26 DIAGNOSIS — E039 Hypothyroidism, unspecified: Secondary | ICD-10-CM | POA: Diagnosis not present

## 2022-04-26 DIAGNOSIS — I5030 Unspecified diastolic (congestive) heart failure: Secondary | ICD-10-CM | POA: Diagnosis not present

## 2022-07-04 DIAGNOSIS — H04123 Dry eye syndrome of bilateral lacrimal glands: Secondary | ICD-10-CM | POA: Diagnosis not present

## 2022-07-11 DIAGNOSIS — G4733 Obstructive sleep apnea (adult) (pediatric): Secondary | ICD-10-CM | POA: Diagnosis not present

## 2022-07-19 DIAGNOSIS — G894 Chronic pain syndrome: Secondary | ICD-10-CM | POA: Diagnosis not present

## 2022-07-19 DIAGNOSIS — Z6841 Body Mass Index (BMI) 40.0 and over, adult: Secondary | ICD-10-CM | POA: Diagnosis not present

## 2022-07-19 DIAGNOSIS — I10 Essential (primary) hypertension: Secondary | ICD-10-CM | POA: Diagnosis not present

## 2022-09-17 DIAGNOSIS — G894 Chronic pain syndrome: Secondary | ICD-10-CM | POA: Diagnosis not present

## 2022-09-17 DIAGNOSIS — Z6841 Body Mass Index (BMI) 40.0 and over, adult: Secondary | ICD-10-CM | POA: Diagnosis not present

## 2022-10-13 DIAGNOSIS — G4733 Obstructive sleep apnea (adult) (pediatric): Secondary | ICD-10-CM | POA: Diagnosis not present

## 2023-01-02 DIAGNOSIS — H04123 Dry eye syndrome of bilateral lacrimal glands: Secondary | ICD-10-CM | POA: Diagnosis not present

## 2023-01-13 DIAGNOSIS — G4733 Obstructive sleep apnea (adult) (pediatric): Secondary | ICD-10-CM | POA: Diagnosis not present

## 2023-04-10 DIAGNOSIS — D1722 Benign lipomatous neoplasm of skin and subcutaneous tissue of left arm: Secondary | ICD-10-CM | POA: Diagnosis not present

## 2023-04-10 DIAGNOSIS — I872 Venous insufficiency (chronic) (peripheral): Secondary | ICD-10-CM | POA: Diagnosis not present

## 2023-04-10 DIAGNOSIS — I890 Lymphedema, not elsewhere classified: Secondary | ICD-10-CM | POA: Diagnosis not present

## 2023-04-15 DIAGNOSIS — G4733 Obstructive sleep apnea (adult) (pediatric): Secondary | ICD-10-CM | POA: Diagnosis not present

## 2023-05-13 ENCOUNTER — Encounter: Payer: Self-pay | Admitting: *Deleted

## 2023-07-17 DIAGNOSIS — G4733 Obstructive sleep apnea (adult) (pediatric): Secondary | ICD-10-CM | POA: Diagnosis not present

## 2023-09-19 ENCOUNTER — Telehealth: Admitting: Family Medicine

## 2023-09-19 VITALS — Wt 302.0 lb

## 2023-09-19 DIAGNOSIS — B9689 Other specified bacterial agents as the cause of diseases classified elsewhere: Secondary | ICD-10-CM | POA: Diagnosis not present

## 2023-09-19 DIAGNOSIS — J329 Chronic sinusitis, unspecified: Secondary | ICD-10-CM

## 2023-09-19 MED ORDER — AMOXICILLIN-POT CLAVULANATE 875-125 MG PO TABS: 1.0000 | ORAL_TABLET | Freq: Two times a day (BID) | ORAL | 0 refills | Status: AC

## 2023-09-19 NOTE — Patient Instructions (Signed)
 Meghan Hoover, thank you for joining Olam DELENA Darby, FNP for today's virtual visit.  While this provider is not your primary care provider (PCP), if your PCP is located in our provider database this encounter information will be shared with them immediately following your visit.   A [facility] MyChart account gives you access to today's visit and all your visits, tests, and labs performed at [facility]  click here if you don't have a [facility] MyChart account or go to [email or web address]  Consent: (Patient) Meghan Hoover provided verbal consent for this virtual visit at the beginning of the encounter.  Current Medications:  Current Outpatient Medications:    amoxicillin -clavulanate (AUGMENTIN ) 875-125 MG tablet, Take 1 tablet by mouth 2 (two) times daily., Disp: 20 tablet, Rfl: 0   clobetasol cream (TEMOVATE) 0.05 %, Apply 1 Application topically 2 (two) times daily., Disp: , Rfl:    diltiazem  (CARDIZEM  CD) 360 MG 24 hr capsule, Take 360 mg by mouth daily., Disp: , Rfl:    lidocaine  (LIDODERM ) 5 %, Place 1 patch onto the skin daily., Disp: , Rfl:    metoprolol tartrate (LOPRESSOR) 50 MG tablet, Take 50 mg by mouth 2 (two) times daily., Disp: , Rfl:    MIEBO 1.338 GM/ML SOLN, Apply 1 drop to eye 4 (four) times daily as needed (as needed)., Disp: , Rfl:    mupirocin ointment (BACTROBAN) 2 %, Apply 1 Application topically daily., Disp: , Rfl:    olmesartan (BENICAR) 40 MG tablet, Take 40 mg by mouth daily., Disp: , Rfl:    RESTASIS 0.05 % ophthalmic emulsion, Place 1 drop into both eyes 2 (two) times daily., Disp: , Rfl:    rosuvastatin (CRESTOR) 10 MG tablet, Take 10 mg by mouth daily., Disp: , Rfl:    SYNTHROID 100 MCG tablet, Take 100 mcg by mouth daily., Disp: , Rfl:    WEGOVY 2.4 MG/0.75ML SOAJ SQ injection, Inject 2.4 mg into the skin once a week., Disp: , Rfl:    Acetaminophen  (MAPAP ACETAMINOPHEN  EXTRA STR) 167 MG/5ML LIQD, Take 15 mLs by mouth every 6 (six) hours  as needed (for pain.)., Disp: , Rfl:    aspirin  EC 81 MG tablet, Take 81 mg by mouth daily., Disp: , Rfl:    celecoxib (CELEBREX) 200 MG capsule, Take 200 mg by mouth daily as needed (for arthritic pain.). , Disp: , Rfl: 1   cetirizine (ZYRTEC) 10 MG tablet, Take 10 mg by mouth daily., Disp: , Rfl:    Cholecalciferol (VITAMIN D3) 2000 UNITS TABS, Take 2,000 Units by mouth every morning. , Disp: , Rfl:    clotrimazole-betamethasone (LOTRISONE) cream, Apply 1 application topically daily as needed. For skin rashes., Disp: , Rfl:    Cyanocobalamin (VITAMIN B-12 CR PO), Place 5,000 mcg under the tongue every morning. , Disp: , Rfl:    escitalopram (LEXAPRO) 10 MG tablet, Take 10 mg by mouth daily., Disp: , Rfl: 1   esomeprazole (NEXIUM) 40 MG capsule, Take 40 mg by mouth every morning. , Disp: , Rfl:    fluticasone  (FLONASE ) 50 MCG/ACT nasal spray, PLACE 2 SPRAYS INTO BOTH NOSTRILS 2 (TWO) TIMES DAILY., Disp: 16 g, Rfl: 0   Liniments (BLUE-EMU SUPER STRENGTH) CREA, Apply 1 application topically 3 (three) times daily as needed (for joint pain.)., Disp: , Rfl:    Multiple Vitamins-Minerals (MULTIVITAMIN WITH MINERALS) tablet, Take 1 tablet by mouth 2 (two) times daily. , Disp: , Rfl:    PROAIR  HFA 108 (90 Base) MCG/ACT  inhaler, INHALE 2 PUFFS INTO THE LUNGS EVERY 6 (SIX) HOURS AS NEEDED FOR WHEEZING OR SHORTNESS OF BREATH., Disp: 8.5 Inhaler, Rfl: 3   Propylene Glycol-Glycerin (SOOTHE) 0.6-0.6 % SOLN, Apply 1 drop to eye 3 (three) times daily., Disp: , Rfl:    Simethicone  (GAS-X EXTRA STRENGTH) 62.5 MG STRP, Take 2-4 strips by mouth 4 (four) times daily as needed (for gas). **do not exceed 8 strips in 24 hrs.**, Disp: , Rfl:    SPIRIVA  HANDIHALER 18 MCG inhalation capsule, INHALE 1 CAPSULE VIA HANDIHALER ONCE DAILY AT THE SAME TIME EVERY DAY, Disp: 30 capsule, Rfl: 0   torsemide  (DEMADEX ) 20 MG tablet, TAKE 1 TABLET BY MOUTH TWICE A DAY, Disp: 180 tablet, Rfl: 3   Medications ordered in this encounter:   Meds ordered this encounter  Medications   amoxicillin -clavulanate (AUGMENTIN ) 875-125 MG tablet    Sig: Take 1 tablet by mouth 2 (two) times daily.    Dispense:  20 tablet    Refill:  0    Supervising Provider:   LAMPTEY, PHILIP O [[id number]]     *If you need refills on other medications prior to your next appointment, please contact your pharmacy*  Follow-Up: Call back or seek an in-person evaluation if the symptoms worsen or if the condition fails to improve as anticipated.  [facility] [phone]  Other Instructions Start antibiotic today Increase fluid intake. Tylenol  or Ibuprofen (if not contraindicated) may be taken as needed for fever/chills. Guaifenesin XR(plain Mucinex) 600mg  two tablets twice a day to help with congestion. Recommend saline spray to each nostril to help thin mucus. Warm moist compresses laying on forehead or across your nose may help relieve some pressure in sinuses. If you have worsening symptoms I would recommend follow up sooner in person.   If you have been instructed to have an in-person evaluation today at a local Urgent Care facility, please use the link below. It will take you to a list of all of our available [facility], including address, phone number and hours of operation. Please do not delay care.  [facility]  If you or a family member do not have a primary care provider, use the link below to schedule a visit and establish care. When you choose a [facility] primary care physician or advanced practice provider, you gain a long-term partner in health. Find a Primary Care Provider  Learn more about [facility] in-office and virtual care options: [facility] - Get Care Now

## 2023-09-19 NOTE — Progress Notes (Signed)
 Virtual Visit Consent   Meghan Hoover, you are scheduled for a virtual visit with a [facility] provider today. Just as with appointments in the office, your consent must be obtained to participate. Your consent will be active for this visit and any virtual visit you may have with one of our providers in the next 365 days. If you have a MyChart account, a copy of this consent can be sent to you electronically.  As this is a virtual visit, video technology does not allow for your provider to perform a traditional examination. This may limit your provider's ability to fully assess your condition. If your provider identifies any concerns that need to be evaluated in person or the need to arrange testing (such as labs, EKG, etc.), we will make arrangements to do so. Although advances in technology are sophisticated, we cannot ensure that it will always work on either your end or our end. If the connection with a video visit is poor, the visit may have to be switched to a telephone visit. With either a video or telephone visit, we are not always able to ensure that we have a secure connection.  By engaging in this virtual visit, you consent to the provision of healthcare and authorize for your insurance to be billed (if applicable) for the services provided during this visit. Depending on your insurance coverage, you may receive a charge related to this service.  I need to obtain your verbal consent now. Are you willing to proceed with your visit today? Meghan Hoover has provided verbal consent on 09/19/2023 for a virtual visit (video or telephone). Olam DELENA Darby, FNP  Date: 09/19/2023 1:46 PM   Virtual Visit via Video Note   I, Olam DELENA Darby, connected with  Meghan Hoover  ([id number], [birth year]) on 09/19/23 at  1:30 PM EDT by a video-enabled telemedicine application and verified that I am speaking with the correct person using two identifiers.  Location: Patient: Virtual Visit Location Patient:  Home Provider: Virtual Visit Location Provider: Home Office   I discussed the limitations of evaluation and management by telemedicine and the availability of in person appointments. The patient expressed understanding and agreed to proceed.    History of Present Illness: Meghan Hoover is a 63 y.o. who identifies as a female who was assigned female at birth, and is being seen today for URI. Started feeling yucky last week (Tuesday or Wednesday). She reports having a productive cough, and treated her self with over the counter stuff she would take for a sinus infection.  Lots of nasal congestion. Taking Mucinex and Nyquil. Headache and head pressure. No pressure over her cheeks. No fever or chills. Denies N/V/D.   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   Morbid obesity with BMI of 60.0-69.9, adult (HCC) 06/27/2014   Morbid obesity (HCC) 08/24/2013   Encounter for screening colonoscopy 05/20/2013   COPD (chronic obstructive pulmonary disease) (HCC) 04/30/2013   Allergic rhinitis 04/30/2013   CAP (community acquired pneumonia) 11/21/2012   Acute diastolic congestive heart failure (HCC) 11/20/2012   Acute respiratory failure with hypoxia (HCC) 11/19/2012   Volume overload 11/19/2012   Bilateral lower extremity edema 11/19/2012   HTN (hypertension) 11/19/2012   Normocytic anemia 11/19/2012   ACHILLES TENDINITIS 03/23/2008    Allergies:  No Active  Allergies  Medications:  Current Outpatient Medications:    amoxicillin -clavulanate (AUGMENTIN ) 875-125 MG tablet, Take 1 tablet by mouth 2 (two) times daily., Disp: 20 tablet, Rfl: 0   clobetasol cream (TEMOVATE) 0.05 %, Apply 1 Application topically 2 (two) times daily., Disp: , Rfl:    diltiazem  (CARDIZEM  CD) 360 MG 24 hr capsule, Take 360 mg by mouth daily., Disp: , Rfl:    lidocaine   (LIDODERM ) 5 %, Place 1 patch onto the skin daily., Disp: , Rfl:    metoprolol tartrate (LOPRESSOR) 50 MG tablet, Take 50 mg by mouth 2 (two) times daily., Disp: , Rfl:    MIEBO 1.338 GM/ML SOLN, Apply 1 drop to eye 4 (four) times daily as needed (as needed)., Disp: , Rfl:    mupirocin ointment (BACTROBAN) 2 %, Apply 1 Application topically daily., Disp: , Rfl:    olmesartan (BENICAR) 40 MG tablet, Take 40 mg by mouth daily., Disp: , Rfl:    RESTASIS 0.05 % ophthalmic emulsion, Place 1 drop into both eyes 2 (two) times daily., Disp: , Rfl:    rosuvastatin (CRESTOR) 10 MG tablet, Take 10 mg by mouth daily., Disp: , Rfl:    SYNTHROID 100 MCG tablet, Take 100 mcg by mouth daily., Disp: , Rfl:    WEGOVY 2.4 MG/0.75ML SOAJ SQ injection, Inject 2.4 mg into the skin once a week., Disp: , Rfl:    Acetaminophen  (MAPAP ACETAMINOPHEN  EXTRA STR) 167 MG/5ML LIQD, Take 15 mLs by mouth every 6 (six) hours as needed (for pain.)., Disp: , Rfl:    aspirin  EC 81 MG tablet, Take 81 mg by mouth daily., Disp: , Rfl:    celecoxib (CELEBREX) 200 MG capsule, Take 200 mg by mouth daily as needed (for arthritic pain.). , Disp: , Rfl: 1   cetirizine (ZYRTEC) 10 MG tablet, Take 10 mg by mouth daily., Disp: , Rfl:    Cholecalciferol (VITAMIN D3) 2000 UNITS TABS, Take 2,000 Units by mouth every morning. , Disp: , Rfl:    clotrimazole-betamethasone (LOTRISONE) cream, Apply 1 application topically daily as needed. For skin rashes., Disp: , Rfl:    Cyanocobalamin (VITAMIN B-12 CR PO), Place 5,000 mcg under the tongue every morning. , Disp: , Rfl:    escitalopram (LEXAPRO) 10 MG tablet, Take 10 mg by mouth daily., Disp: , Rfl: 1   esomeprazole (NEXIUM) 40 MG capsule, Take 40 mg by mouth every morning. , Disp: , Rfl:    fluticasone  (FLONASE ) 50 MCG/ACT nasal spray, PLACE 2 SPRAYS INTO BOTH NOSTRILS 2 (TWO) TIMES DAILY., Disp: 16 g, Rfl: 0   Liniments (BLUE-EMU SUPER STRENGTH) CREA, Apply 1 application topically 3 (three) times daily  as needed (for joint pain.)., Disp: , Rfl:    Multiple Vitamins-Minerals (MULTIVITAMIN WITH MINERALS) tablet, Take 1 tablet by mouth 2 (two) times daily. , Disp: , Rfl:    PROAIR  HFA 108 (90 Base) MCG/ACT inhaler, INHALE 2 PUFFS INTO THE LUNGS EVERY 6 (SIX) HOURS AS NEEDED FOR WHEEZING OR SHORTNESS OF BREATH., Disp: 8.5 Inhaler, Rfl: 3   Propylene Glycol-Glycerin (SOOTHE) 0.6-0.6 % SOLN, Apply 1 drop to eye 3 (three) times daily., Disp: , Rfl:    Simethicone  (GAS-X EXTRA STRENGTH) 62.5 MG STRP, Take 2-4 strips by mouth 4 (four) times daily as needed (for gas). **do not exceed 8 strips in 24 hrs.**, Disp: , Rfl:    SPIRIVA  HANDIHALER 18 MCG inhalation capsule, INHALE 1 CAPSULE VIA HANDIHALER ONCE DAILY AT THE SAME TIME EVERY DAY, Disp: 30 capsule, Rfl: 0  torsemide  (DEMADEX ) 20 MG tablet, TAKE 1 TABLET BY MOUTH TWICE A DAY, Disp: 180 tablet, Rfl: 3  Observations/Objective: Patient is well-developed, well-nourished in no acute distress.  Resting comfortably at home.  Head is normocephalic, atraumatic.  No labored breathing.  Speech is clear and coherent with logical content.  Patient is alert and oriented at baseline.  Sounds congested while talking.  Assessment and Plan: 1. Bacterial sinusitis (Primary) - amoxicillin -clavulanate (AUGMENTIN ) 875-125 MG tablet; Take 1 tablet by mouth 2 (two) times daily.  Dispense: 20 tablet; Refill: 0 Start antibiotic today Increase fluid intake. Tylenol  or Ibuprofen (if not contraindicated) may be taken as needed for fever/chills. Guaifenesin XR(plain Mucinex) 600mg  two tablets twice a day to help with congestion. Recommend saline spray to each nostril to help thin mucus. Warm moist compresses laying on forehead or across your nose may help relieve some pressure in sinuses. If you have worsening symptoms I would recommend follow up sooner in person.    Follow Up Instructions: I discussed the assessment and treatment plan with the patient. The patient  was provided an opportunity to ask questions and all were answered. The patient agreed with the plan and demonstrated an understanding of the instructions.  A copy of instructions were sent to the patient via MyChart unless otherwise noted below.    The patient was advised to call back or seek an in-person evaluation if the symptoms worsen or if the condition fails to improve as anticipated.    Olam DELENA Darby, FNP

## 2023-10-20 ENCOUNTER — Other Ambulatory Visit (HOSPITAL_COMMUNITY): Payer: Self-pay

## 2023-10-20 DIAGNOSIS — Z87891 Personal history of nicotine dependence: Secondary | ICD-10-CM

## 2023-10-31 ENCOUNTER — Ambulatory Visit (HOSPITAL_COMMUNITY)
Admission: RE | Admit: 2023-10-31 | Discharge: 2023-10-31 | Disposition: A | Discharge status: Home / Self Care | Source: Ambulatory Visit

## 2023-10-31 DIAGNOSIS — Z87891 Personal history of nicotine dependence: Secondary | ICD-10-CM | POA: Insufficient documentation

## 2023-11-04 ENCOUNTER — Other Ambulatory Visit (HOSPITAL_COMMUNITY): Payer: Self-pay

## 2023-11-04 DIAGNOSIS — I359 Nonrheumatic aortic valve disorder, unspecified: Secondary | ICD-10-CM

## 2023-11-04 DIAGNOSIS — Z1211 Encounter for screening for malignant neoplasm of colon: Secondary | ICD-10-CM | POA: Diagnosis not present

## 2023-11-10 LAB — COLOGUARD: COLOGUARD: POSITIVE — AB

## 2023-12-01 ENCOUNTER — Encounter: Payer: Self-pay | Admitting: Internal Medicine

## 2023-12-10 ENCOUNTER — Ambulatory Visit (HOSPITAL_COMMUNITY)
Admission: RE | Admit: 2023-12-10 | Discharge: 2023-12-10 | Disposition: A | Discharge status: Home / Self Care | Source: Ambulatory Visit | Attending: Family Medicine | Admitting: Family Medicine

## 2023-12-10 DIAGNOSIS — I350 Nonrheumatic aortic (valve) stenosis: Secondary | ICD-10-CM

## 2023-12-10 DIAGNOSIS — I359 Nonrheumatic aortic valve disorder, unspecified: Secondary | ICD-10-CM | POA: Diagnosis not present

## 2023-12-10 LAB — ECHOCARDIOGRAM COMPLETE
AR max vel: 1.21 cm2
AV Area VTI: 1.25 cm2
AV Area mean vel: 1.17 cm2
AV Mean grad: 15.5 mmHg
AV Peak grad: 29.1 mmHg
Ao pk vel: 2.7 m/s
Area-P 1/2: 3.37 cm2
S' Lateral: 2.6 cm

## 2023-12-10 NOTE — Progress Notes (Signed)
*  PRELIMINARY RESULTS* Echocardiogram 2D Echocardiogram has been performed.  Meghan Hoover 12/10/2023, 12:10 PM
